# Patient Record
Sex: Female | Born: 1960
Health system: Southern US, Community
[De-identification: ages and names within clinical notes are randomized; demographics above are authoritative.]

## PROBLEM LIST (undated history)

## (undated) DIAGNOSIS — Z8742 Personal history of other diseases of the female genital tract: Secondary | ICD-10-CM

## (undated) DIAGNOSIS — M549 Dorsalgia, unspecified: Secondary | ICD-10-CM

## (undated) DIAGNOSIS — R3129 Other microscopic hematuria: Secondary | ICD-10-CM

## (undated) DIAGNOSIS — D649 Anemia, unspecified: Secondary | ICD-10-CM

## (undated) DIAGNOSIS — D219 Benign neoplasm of connective and other soft tissue, unspecified: Secondary | ICD-10-CM

## (undated) DIAGNOSIS — R0602 Shortness of breath: Secondary | ICD-10-CM

## (undated) DIAGNOSIS — I10 Essential (primary) hypertension: Secondary | ICD-10-CM

## (undated) DIAGNOSIS — Z8639 Personal history of other endocrine, nutritional and metabolic disease: Secondary | ICD-10-CM

## (undated) DIAGNOSIS — Z87448 Personal history of other diseases of urinary system: Secondary | ICD-10-CM

## (undated) DIAGNOSIS — N63 Unspecified lump in unspecified breast: Secondary | ICD-10-CM

## (undated) DIAGNOSIS — B977 Papillomavirus as the cause of diseases classified elsewhere: Secondary | ICD-10-CM

## (undated) DIAGNOSIS — R0789 Other chest pain: Secondary | ICD-10-CM

## (undated) DIAGNOSIS — B373 Candidiasis of vulva and vagina: Secondary | ICD-10-CM

## (undated) HISTORY — DX: Essential (primary) hypertension: I10

## (undated) HISTORY — PX: ABDOMINAL HYSTERECTOMY: SHX81

## (undated) HISTORY — DX: Unspecified lump in unspecified breast: N63.0

## (undated) HISTORY — DX: Personal history of other diseases of the female genital tract: Z87.42

## (undated) HISTORY — DX: Personal history of other diseases of urinary system: Z87.448

## (undated) HISTORY — DX: Anemia, unspecified: D64.9

## (undated) HISTORY — DX: Other chest pain: R07.89

## (undated) HISTORY — DX: Personal history of other endocrine, nutritional and metabolic disease: Z86.39

## (undated) HISTORY — DX: Benign neoplasm of connective and other soft tissue, unspecified: D21.9

## (undated) HISTORY — DX: Shortness of breath: R06.02

## (undated) HISTORY — DX: Papillomavirus as the cause of diseases classified elsewhere: B97.7

## (undated) HISTORY — DX: Other microscopic hematuria: R31.29

## (undated) HISTORY — DX: Candidiasis of vulva and vagina: B37.3

## (undated) HISTORY — DX: Dorsalgia, unspecified: M54.9

---

## 1982-09-09 HISTORY — PX: TUBAL LIGATION: SHX77

## 2000-11-14 ENCOUNTER — Other Ambulatory Visit: Admission: RE | Admit: 2000-11-14 | Discharge: 2000-11-14 | Payer: Self-pay | Admitting: Family Medicine

## 2001-05-14 ENCOUNTER — Encounter: Payer: Self-pay | Admitting: Family Medicine

## 2001-05-14 ENCOUNTER — Ambulatory Visit (HOSPITAL_COMMUNITY): Admission: RE | Admit: 2001-05-14 | Discharge: 2001-05-14 | Payer: Self-pay | Admitting: Family Medicine

## 2001-09-09 HISTORY — PX: HYSTEROSCOPY WITH D & C: SHX1775

## 2002-01-12 ENCOUNTER — Ambulatory Visit (HOSPITAL_COMMUNITY): Admission: RE | Admit: 2002-01-12 | Discharge: 2002-01-12 | Payer: Self-pay | Admitting: Obstetrics and Gynecology

## 2002-01-12 ENCOUNTER — Encounter: Payer: Self-pay | Admitting: Obstetrics and Gynecology

## 2002-05-28 ENCOUNTER — Encounter (INDEPENDENT_AMBULATORY_CARE_PROVIDER_SITE_OTHER): Payer: Self-pay | Admitting: Specialist

## 2002-05-28 ENCOUNTER — Ambulatory Visit (HOSPITAL_COMMUNITY): Admission: RE | Admit: 2002-05-28 | Discharge: 2002-05-28 | Payer: Self-pay | Admitting: Obstetrics and Gynecology

## 2002-11-09 ENCOUNTER — Other Ambulatory Visit: Admission: RE | Admit: 2002-11-09 | Discharge: 2002-11-09 | Payer: Self-pay | Admitting: Obstetrics and Gynecology

## 2003-07-19 ENCOUNTER — Encounter: Admission: RE | Admit: 2003-07-19 | Discharge: 2003-10-17 | Payer: Self-pay | Admitting: Family Medicine

## 2003-12-23 ENCOUNTER — Other Ambulatory Visit: Admission: RE | Admit: 2003-12-23 | Discharge: 2003-12-23 | Payer: Self-pay | Admitting: Obstetrics and Gynecology

## 2004-12-25 ENCOUNTER — Other Ambulatory Visit: Admission: RE | Admit: 2004-12-25 | Discharge: 2004-12-25 | Payer: Self-pay | Admitting: Obstetrics and Gynecology

## 2005-01-15 ENCOUNTER — Ambulatory Visit (HOSPITAL_COMMUNITY): Admission: RE | Admit: 2005-01-15 | Discharge: 2005-01-15 | Payer: Self-pay | Admitting: Obstetrics and Gynecology

## 2005-11-29 ENCOUNTER — Other Ambulatory Visit: Admission: RE | Admit: 2005-11-29 | Discharge: 2005-11-29 | Payer: Self-pay | Admitting: Obstetrics and Gynecology

## 2006-01-07 ENCOUNTER — Encounter: Admission: RE | Admit: 2006-01-07 | Discharge: 2006-02-26 | Payer: Self-pay | Admitting: Family Medicine

## 2006-04-01 ENCOUNTER — Ambulatory Visit (HOSPITAL_COMMUNITY): Admission: RE | Admit: 2006-04-01 | Discharge: 2006-04-02 | Payer: Self-pay | Admitting: Obstetrics and Gynecology

## 2006-04-01 ENCOUNTER — Encounter (INDEPENDENT_AMBULATORY_CARE_PROVIDER_SITE_OTHER): Payer: Self-pay | Admitting: *Deleted

## 2006-04-30 ENCOUNTER — Encounter: Admission: RE | Admit: 2006-04-30 | Discharge: 2006-04-30 | Payer: Self-pay | Admitting: Family Medicine

## 2006-06-28 ENCOUNTER — Encounter: Admission: RE | Admit: 2006-06-28 | Discharge: 2006-06-28 | Payer: Self-pay | Admitting: Orthopedic Surgery

## 2006-09-09 DIAGNOSIS — B373 Candidiasis of vulva and vagina: Secondary | ICD-10-CM

## 2006-09-09 DIAGNOSIS — B3731 Acute candidiasis of vulva and vagina: Secondary | ICD-10-CM

## 2006-09-09 HISTORY — DX: Acute candidiasis of vulva and vagina: B37.31

## 2006-09-09 HISTORY — DX: Candidiasis of vulva and vagina: B37.3

## 2006-09-09 HISTORY — PX: POLYPECTOMY: SHX149

## 2007-02-13 ENCOUNTER — Encounter: Admission: RE | Admit: 2007-02-13 | Discharge: 2007-03-10 | Payer: Self-pay | Admitting: Family Medicine

## 2007-06-10 ENCOUNTER — Encounter: Admission: RE | Admit: 2007-06-10 | Discharge: 2007-06-10 | Payer: Self-pay | Admitting: Family Medicine

## 2007-09-10 DIAGNOSIS — N63 Unspecified lump in unspecified breast: Secondary | ICD-10-CM

## 2007-09-10 HISTORY — DX: Unspecified lump in unspecified breast: N63.0

## 2010-04-06 ENCOUNTER — Encounter: Admission: RE | Admit: 2010-04-06 | Discharge: 2010-04-06 | Payer: Self-pay | Admitting: Family Medicine

## 2010-09-09 DIAGNOSIS — R3129 Other microscopic hematuria: Secondary | ICD-10-CM

## 2010-09-09 HISTORY — DX: Other microscopic hematuria: R31.29

## 2011-01-25 NOTE — H&P (Signed)
NAME:  Marzo, Traniece                 ACCOUNT NO.:  [id number]   MEDICAL RECORD NO.:  [id number]          PATIENT TYPE:  AMB   LOCATION:  SDC                           FACILITY:  [facility]   PHYSICIAN:  Naima A. Dillard, M.D. DATE OF BIRTH:  [birth year]   DATE OF ADMISSION:  DATE OF DISCHARGE:                                HISTORY & PHYSICAL   ADDENDUM TO HISTORY AND PHYSICAL:  Henreitta Leber, PA, is dictating this  History and Physical examination on Janet Humphreys for Naima A. Dillard, MD.   At the end of the dictation, I should have said it is for Naima A. Normand Sloop,  MD.      Marquis Lunch. Adline Peals.      Naima A. Normand Sloop, M.D.  Electronically Signed    EJP/MEDQ  D:  03/26/2006  T:  03/26/2006  Job:  [id number]

## 2011-01-25 NOTE — Discharge Summary (Signed)
NAME:  Erin [name], Erin [name]                 ACCOUNT NO.:  [id number]   MEDICAL RECORD NO.:  [id number]          PATIENT TYPE:  OIB   LOCATION:  9305                          FACILITY:  [facility]   PHYSICIAN:  Naima A. Dillard, M.D. DATE OF BIRTH:  [birth year]   DATE OF ADMISSION:  04/01/2006  DATE OF DISCHARGE:  04/02/2006                                 DISCHARGE SUMMARY   DISCHARGE DIAGNOSES:  1. Fibroid uterus.  2. Menorrhagia.  3. Dysmenorrhea.  4. Adenomyosis.   OPERATION:  On the date of admission, the patient underwent a  laparoscopically-assisted vaginal hysterectomy along with biopsy of a  posterior cul-de-sac mass, tolerating procedure well.  The patient was found  to have a uterus weighing approximately 493.8 g.   HISTORY OF PRESENT ILLNESS:  Erin [name] is a 50 year old single African  American female status post bilateral tubal ligation, para 2-0-0-2 who  presents for a laparoscopically-assisted vaginal hysterectomy because of  symptomatic fibroids.  Please see the patient's dictated history and  physical examination for details.   PREOPERATIVE PHYSICAL EXAMINATION:  VITAL SIGNS:  Blood pressure 120/70,  weight is 330, height is 5 feet 9-1/2 inches tall  GENERAL:  Within normal limits.  PELVIC:  EGBUS was within normal limits.  Vagina was normal.  Cervix was  nontender without lesions.  Uterus appeared normal size, shape and  consistency without tenderness.  Adnexa was without tenderness or masses.  Rectovaginal without any masses.   HOSPITAL COURSE:  On the date of admission, the patient underwent  aforementioned procedures, tolerating them all well.  The patient's  postoperative course was marked by fluctuations in her fasting glucose  levels between 104 and 121.  The patient was asymptomatic with these  findings and tolerated her postop hemoglobin of 7.5 (preop hemoglobin 9.5)  without symptomatology.  By postop day #1, the patient had resumed bowel and  bladder function and  was therefore deemed ready for discharge home.   DISCHARGE MEDICATIONS:  1. Tylox one or two tablets every 4 hours as needed for pain.  2. Colace 100 mg two to three times daily until bowel movements are      regular.  3. Ibuprofen 600 mg every 6 hours with food for 3 days, then as needed for      pain.  4. Phenergan 25 mg every 6 hours as needed for nausea.   The patient was also advised to follow instructions on her home medication  reconciliation sheet.   FOLLOW UP:  The patient has a 6-week postoperative visit with Dr. Normand Sloop on  May 13, 2006, at 10:30 a.m.  She was also advised to schedule an  appointment with Dr. Shaune Pollack for further evaluation and management of  her blood sugars.   DISCHARGE INSTRUCTIONS:  The patient was given a copy of [facility] postoperative instruction sheet.  She was further advised to check  her blood sugars fasting and every 2 hours after eating and report these  findings to Dr. Kevan Ny.  The patient was told to avoid driving for 2 weeks,  heavy lifting for  4 weeks (over 10 pounds), sexual activity for 6 weeks,  that she may shower, walk up steps, increase activities slowly.  Her diet to  be a low-sodium and modified diabetic diet.   FINAL PATHOLOGY:  Cul-de-sac biopsy - fibrocalcific nodule.  Uterus and  cervix:  Cervix - chronic cervicitis with squamous metaplasia, no  intraepithelial lesion identified; endometrium - proliferative endometrium,  no hyperplasia or malignancy identified; myometrium - adenomyosis and  leiomyoma.      [place] J. Adline Peals.      Naima A. Normand Sloop, M.D.  Electronically Signed    EJP/MEDQ  D:  04/30/2006  T:  04/30/2006  Job:  [id number]

## 2011-01-25 NOTE — Op Note (Signed)
NAME:  Erin [name], Erin [name]                 ACCOUNT NO.:  [id number]   MEDICAL RECORD NO.:  [id number]          PATIENT TYPE:  AMB   LOCATION:  SDC                           FACILITY:  [facility]   PHYSICIAN:  Naima A. Dillard, M.D. DATE OF BIRTH:  02-Sep-1961   DATE OF PROCEDURE:  04/01/2006  DATE OF DISCHARGE:                                 OPERATIVE REPORT   PREOPERATIVE DIAGNOSIS:  Fibroids, menorrhagia, and dysmenorrhea.   POSTOPERATIVE DIAGNOSIS:  Fibroids, menorrhagia, and dysmenorrhea.   PROCEDURE:  Laparoscopic-assisted vaginal hysterectomy.   SURGEON:  Naima A. Normand Sloop, M.D.   ASSISTANTMarquis Lunch. Adline Peals.   ANESTHESIA:  General.   SPECIMENS:  Went to pathology, uterus and cervix, 493.8 gm.  Posterior cul  de sac mass to pathology.   ESTIMATED BLOOD LOSS:  500 cc.   URINE OUTPUT:  200 cc.   IV FLUIDS:  3200 cc.   COMPLICATIONS:  None.   Patient went to PACU in stable condition.   PROCEDURE IN DETAIL:  Patient was taken to the operating room, where she was  given general anesthesia, placed in the dorsal lithotomy position, and  prepped and draped in a normal sterile fashion.  A bivalve speculum was  placed into the vagina.  The anterior lip of the cervix was grasped with a  single-tooth tenaculum.  A Hulka manipulator was placed into the uterus.  The single-tooth tenaculum was then removed, and all instruments except for  the Hulka were removed from the vagina.  A Foley catheter was placed.  Attention was then turned to the abdomen, where a 10 mm infraumbilical  incision was made with a scalpel and carried down to the fascia.  The fascia  was incised and extended bilaterally.  The peritoneum was identified, tented  up, and entered sharply.  The fascia was ligated in a purse-string fashion  with an 0 Vicryl.  The Hasson trocar was placed and anchored down with the 0  Vicryl.  Marcaine 0.25% 5 cc was placed before the incision was made.  Intra-  abdominal placement was  confirmed with the laparoscope.  The abdomen was  insufflated with CO2 gas.  The patient had a large, about 12 weeks size  uterus.  Normal-appearing tubes and ovaries, but the tubes had been ligated.  Patient had a normal-appearing liver and gallbladder, normal-appearing  appendix and abdominal anatomy.  Attention was then turned to the right and  left lower quadrant, where 2.5 cc of 0.25% Marcaine was placed, two 5 mm  incisions were placed, and 5 mm trocars were placed under direct  visualization of the laparoscope.  The patient's round ligaments were  cauterized with a gyrus and then cut.  A bladder flap was made with  hydrodissection using a Nezhat and cut away using Metzenbaum scissors.  The  patient's utero-ovarian ligaments were cauterized and then cut.  Hemostasis  was assured.  Attention was then turned to the vagina, where a  circumferential incision was made after 40 cc of Pitressin mixture was  placed circumferentially around the cervix.  The rectum and bladder was  dissected off the cervix.  __________, the posterior cul de sac was then  entered sharply using scissors.  There was a small, little sebaceous gland  cyst or something, and the cul de sac was sent for pathology.  The anterior  cul de sac was also entered sharply using Metzenbaum scissors.  The bladder  was noted to be safe.  The uterosacral ligaments were clamped with Heaney  clamps, cut, and suture ligated.  The uterine arteries were cut with Heaney  clamps, cut and suture ligated.  The uterus was then removed without  difficulty.  There was some bleeding along the patient's right side wall,  which was made hemostatic with figure-of-eight stitch and a vessel was tied  down without difficulty.  A McCall suture was then placed with 0 Vicryl.  The vaginal cuff was closed with 0 chromic in a running locked fashion.  The  incision was then turned back to the abdomen.  There was a small amount of  bleeding just right at  the vaginal cuff which was made hemostatic with  cautery.  Gelfoam was placed in that area.  Irrigation was done.  All areas  were noted to be hemostatic.  All instruments were removed under direct  visualization of the laparoscope in all areas.  Ports were seen to be  hemostatic.  The fascia was reapproximated by tying the purse-string suture.  The skin at the umbilical site was reapproximated using 3-0 Monocryl.  The  two small 5 mm skin incisions were reapproximated using Dermabond.  Sponge,  lap, and needle counts were correct.  Patient went to the recovery room in a  stable condition.      Naima A. Normand Sloop, M.D.  Electronically Signed     NAD/MEDQ  D:  04/01/2006  T:  04/01/2006  Job:  [id number]

## 2012-03-16 ENCOUNTER — Ambulatory Visit: Payer: Self-pay | Admitting: Obstetrics and Gynecology

## 2012-04-03 ENCOUNTER — Encounter: Payer: Self-pay | Admitting: Obstetrics and Gynecology

## 2012-04-03 ENCOUNTER — Ambulatory Visit (INDEPENDENT_AMBULATORY_CARE_PROVIDER_SITE_OTHER): Payer: 59 | Admitting: Obstetrics and Gynecology

## 2012-04-03 VITALS — BP 124/80 | Ht 69.0 in | Wt 348.0 lb

## 2012-04-03 DIAGNOSIS — Z0000 Encounter for general adult medical examination without abnormal findings: Secondary | ICD-10-CM

## 2012-04-03 NOTE — Addendum Note (Signed)
Addended by: Rolla Plate on: 04/03/2012 10:34 AM   Modules accepted: Orders

## 2012-04-03 NOTE — Progress Notes (Signed)
Last Pap: 1/11 WNL: Yes Regular Periods:no Contraception: hyst  Monthly Breast exam:yes Tetanus<32yrs:yes Nl.Bladder Function:yes Daily BMs:yes Healthy Diet:no Calcium:yes Mammogram:yes Date of Mammogram: 4/13 wnl per pt Exercise:no Have often Exercise: n/a Seatbelt: yes Abuse at home: no Stressful work:no Sigmoid-colonoscopy: 5 yrs ago wnl per pt BP 124/80  Ht 5\' 9"  (1.753 m)  Wt 348 lb (157.852 kg)  BMI 51.39 kg/m2 Pt without complaints Physical Examination: General appearance - alert, well appearing, and in no distress Mental status - normal mood, behavior, speech, dress, motor activity, and thought processes Neck - supple, no significant adenopathy, thyroid exam: thyroid is normal in size without nodules or tenderness Chest - clear to auscultation, no wheezes, rales or rhonchi, symmetric air entry Heart - normal rate and regular rhythm Abdomen - soft, nontender, nondistended, no masses or organomegaly Breasts - breasts appear normal, no suspicious masses, no skin or nipple changes or axillary nodes Pelvic - normal external genitalia, vulva, vagina, cervix, uterus and adnexa Rectal - normal rectal, no masses Back exam - full range of motion, no tenderness, palpable spasm or pain on motion Neurological - alert, oriented, normal speech, no focal findings or movement disorder noted Musculoskeletal - no joint tenderness, deformity or swelling Extremities - no edema, redness or tenderness in the calves or thighs Skin - normal coloration and turgor, no rashes, no suspicious skin lesions noted Routine exam Pap sent yes if normal last one needed Mammogram due no  RT 1 yr  Bone Density: No PCP: Dr.GAtes Change in PMH: no change Change in FMH:no change

## 2012-04-06 LAB — PAP IG W/ RFLX HPV ASCU

## 2012-04-23 ENCOUNTER — Ambulatory Visit
Admission: RE | Admit: 2012-04-23 | Discharge: 2012-04-23 | Disposition: A | Discharge status: Home / Self Care | Payer: Self-pay | Source: Ambulatory Visit | Attending: Family Medicine | Admitting: Family Medicine

## 2012-04-23 ENCOUNTER — Other Ambulatory Visit: Payer: Self-pay | Admitting: Family Medicine

## 2012-04-23 DIAGNOSIS — R52 Pain, unspecified: Secondary | ICD-10-CM

## 2012-07-21 ENCOUNTER — Telehealth: Payer: Self-pay | Admitting: Obstetrics and Gynecology

## 2012-07-21 NOTE — Telephone Encounter (Signed)
Lm on vm to cb per telephone call.  

## 2012-07-21 NOTE — Telephone Encounter (Signed)
Tc to pt. Pt needs eval to r/o yeast infection. Appt sched 07/22/12@11 :30 with EP for eval. Pt agrees.

## 2012-07-21 NOTE — Telephone Encounter (Signed)
Tc to pt per telephone call. Pt recently completed ATB's pres by dentist. Pt req rx for a yeast infection. Pt with h/o yeast infection after taking ATB's. Will consult with ND per recs. Pt agrees.

## 2012-07-22 ENCOUNTER — Ambulatory Visit (INDEPENDENT_AMBULATORY_CARE_PROVIDER_SITE_OTHER): Payer: 59 | Admitting: Obstetrics and Gynecology

## 2012-07-22 ENCOUNTER — Telehealth: Payer: Self-pay | Admitting: Obstetrics and Gynecology

## 2012-07-22 ENCOUNTER — Encounter: Payer: Self-pay | Admitting: Obstetrics and Gynecology

## 2012-07-22 VITALS — BP 122/80 | Wt 310.0 lb

## 2012-07-22 DIAGNOSIS — I10 Essential (primary) hypertension: Secondary | ICD-10-CM

## 2012-07-22 DIAGNOSIS — E119 Type 2 diabetes mellitus without complications: Secondary | ICD-10-CM

## 2012-07-22 DIAGNOSIS — N760 Acute vaginitis: Secondary | ICD-10-CM

## 2012-07-22 DIAGNOSIS — E1159 Type 2 diabetes mellitus with other circulatory complications: Secondary | ICD-10-CM | POA: Insufficient documentation

## 2012-07-22 DIAGNOSIS — I152 Hypertension secondary to endocrine disorders: Secondary | ICD-10-CM | POA: Insufficient documentation

## 2012-07-22 DIAGNOSIS — A5901 Trichomonal vulvovaginitis: Secondary | ICD-10-CM

## 2012-07-22 LAB — POCT WET PREP (WET MOUNT)
Whiff Test: NEGATIVE
pH: 5.5

## 2012-07-22 MED ORDER — FLUCONAZOLE 150 MG PO TABS: 150.0000 mg | ORAL_TABLET | Freq: Once | ORAL | Status: DC

## 2012-07-22 MED ORDER — METRONIDAZOLE 500 MG PO TABS: ORAL_TABLET | ORAL | Status: DC

## 2012-07-22 MED ORDER — TERCONAZOLE 0.4 % VA CREA: 1.0000 | TOPICAL_CREAM | Freq: Every day | VAGINAL | Status: DC

## 2012-07-22 NOTE — Addendum Note (Signed)
Addended by: Henreitta Leber on: 07/22/2012 01:49 PM   Modules accepted: Orders

## 2012-07-22 NOTE — Progress Notes (Signed)
51 YO with recent 7 day course of antibiotics complains of vaginal irritatiion.  O: Pelvic:  EGBUS-wnl, vagina-copious thin grey discharge, uterus/cervix-surgically absent, adnexae-no masses  Wet Prep: pH-5.5,  whiff-negative,  many trichomonads (motile)  A: Trichomoniasis  P:  Reviewed trichomoniasis and need for partner treatment      Metronidazole 500 mg  #4  4 po stat no refills      Terazol 7 Vaginal #1 tube 1 app. pv qhs x 7 days no refill       Diflucan 150 mg #1  1 po stat  1 refill       RTO-as scheduled or prn  Noeli Lavery, PA-C

## 2012-07-22 NOTE — Patient Instructions (Signed)
Trichomoniasis Trichomoniasis is an infection, caused by the Trichomonas organism, that affects both women and men. In women, the outer female genitalia and the vagina are affected. In men, the penis is mainly affected, but the prostate and other reproductive organs can also be involved. Trichomoniasis is a sexually transmitted disease (STD) and is most often passed to another person through sexual contact. The majority of people who get trichomoniasis do so from a sexual encounter and are also at risk for other STDs. CAUSES   Sexual intercourse with an infected partner.  It can be present in swimming pools or hot tubs. SYMPTOMS   Abnormal gray-green frothy vaginal discharge in women.  Vaginal itching and irritation in women.  Itching and irritation of the area outside the vagina in women.  Penile discharge with or without pain in males.  Inflammation of the urethra (urethritis), causing painful urination.  Bleeding after sexual intercourse. RELATED COMPLICATIONS  Pelvic inflammatory disease.  Infection of the uterus (endometritis).  Infertility.  Tubal (ectopic) pregnancy.  It can be associated with other STDs, including gonorrhea and chlamydia, hepatitis B, and HIV. COMPLICATIONS DURING PREGNANCY  Early (premature) delivery.  Premature rupture of the membranes (PROM).  Low birth weight. DIAGNOSIS   Visualization of Trichomonas under the microscope from the vagina discharge.  Ph of the vagina greater than 4.5, tested with a test tape.  Trich Rapid Test.  Culture of the organism, but this is not usually needed.  It may be found on a Pap test.  Having a "strawberry cervix,"which means the cervix looks very red like a strawberry. TREATMENT   You may be given medication to fight the infection. Inform your caregiver if you could be or are pregnant. Some medications used to treat the infection should not be taken during pregnancy.  Over-the-counter medications or  creams to decrease itching or irritation may be recommended.  Your sexual partner will need to be treated if infected. HOME CARE INSTRUCTIONS   Take all medication prescribed by your caregiver.  Take over-the-counter medication for itching or irritation as directed by your caregiver.  Do not have sexual intercourse while you have the infection.  Do not douche or wear tampons.  Discuss your infection with your partner, as your partner may have acquired the infection from you. Or, your partner may have been the person who transmitted the infection to you.  Have your sex partner examined and treated if necessary.  Practice safe, informed, and protected sex.  See your caregiver for other STD testing. SEEK MEDICAL CARE IF:   You still have symptoms after you finish the medication.  You have an oral temperature above 102 F (38.9 C).  You develop belly (abdominal) pain.  You have pain when you urinate.  You have bleeding after sexual intercourse.  You develop a rash.  The medication makes you sick or makes you throw up (vomit). Document Released: 02/19/2001 Document Revised: 11/18/2011 Document Reviewed: 03/17/2009 [employer] Patient Information 2013 [employer].  

## 2012-07-22 NOTE — Progress Notes (Signed)
Color: white Odor: no Itching:yes Thin:yes Thick:no Fever:no Dyspareunia:no Hx PID:no HX STD:no Pelvic Pain:no Desires Gc/CT:no Desires HIV,RPR,HbsAG:no 

## 2012-07-22 NOTE — Telephone Encounter (Signed)
Tc to pt per telephone call. Pt received 14 pills of Flagyl, but told to only take 4 pills. Informed pt only to take 4 pills STAT. Pt voices understanding.

## 2014-07-03 IMAGING — CR DG ANKLE COMPLETE 3+V*R*
2 series · 2 of 2 positions shown · non-contrast
Comparison: None.

CLINICAL DATA: Pain and swelling for several weeks, no acute injury

RIGHT ANKLE - COMPLETE 3+ VIEW

[view not recorded (1 of 2)]
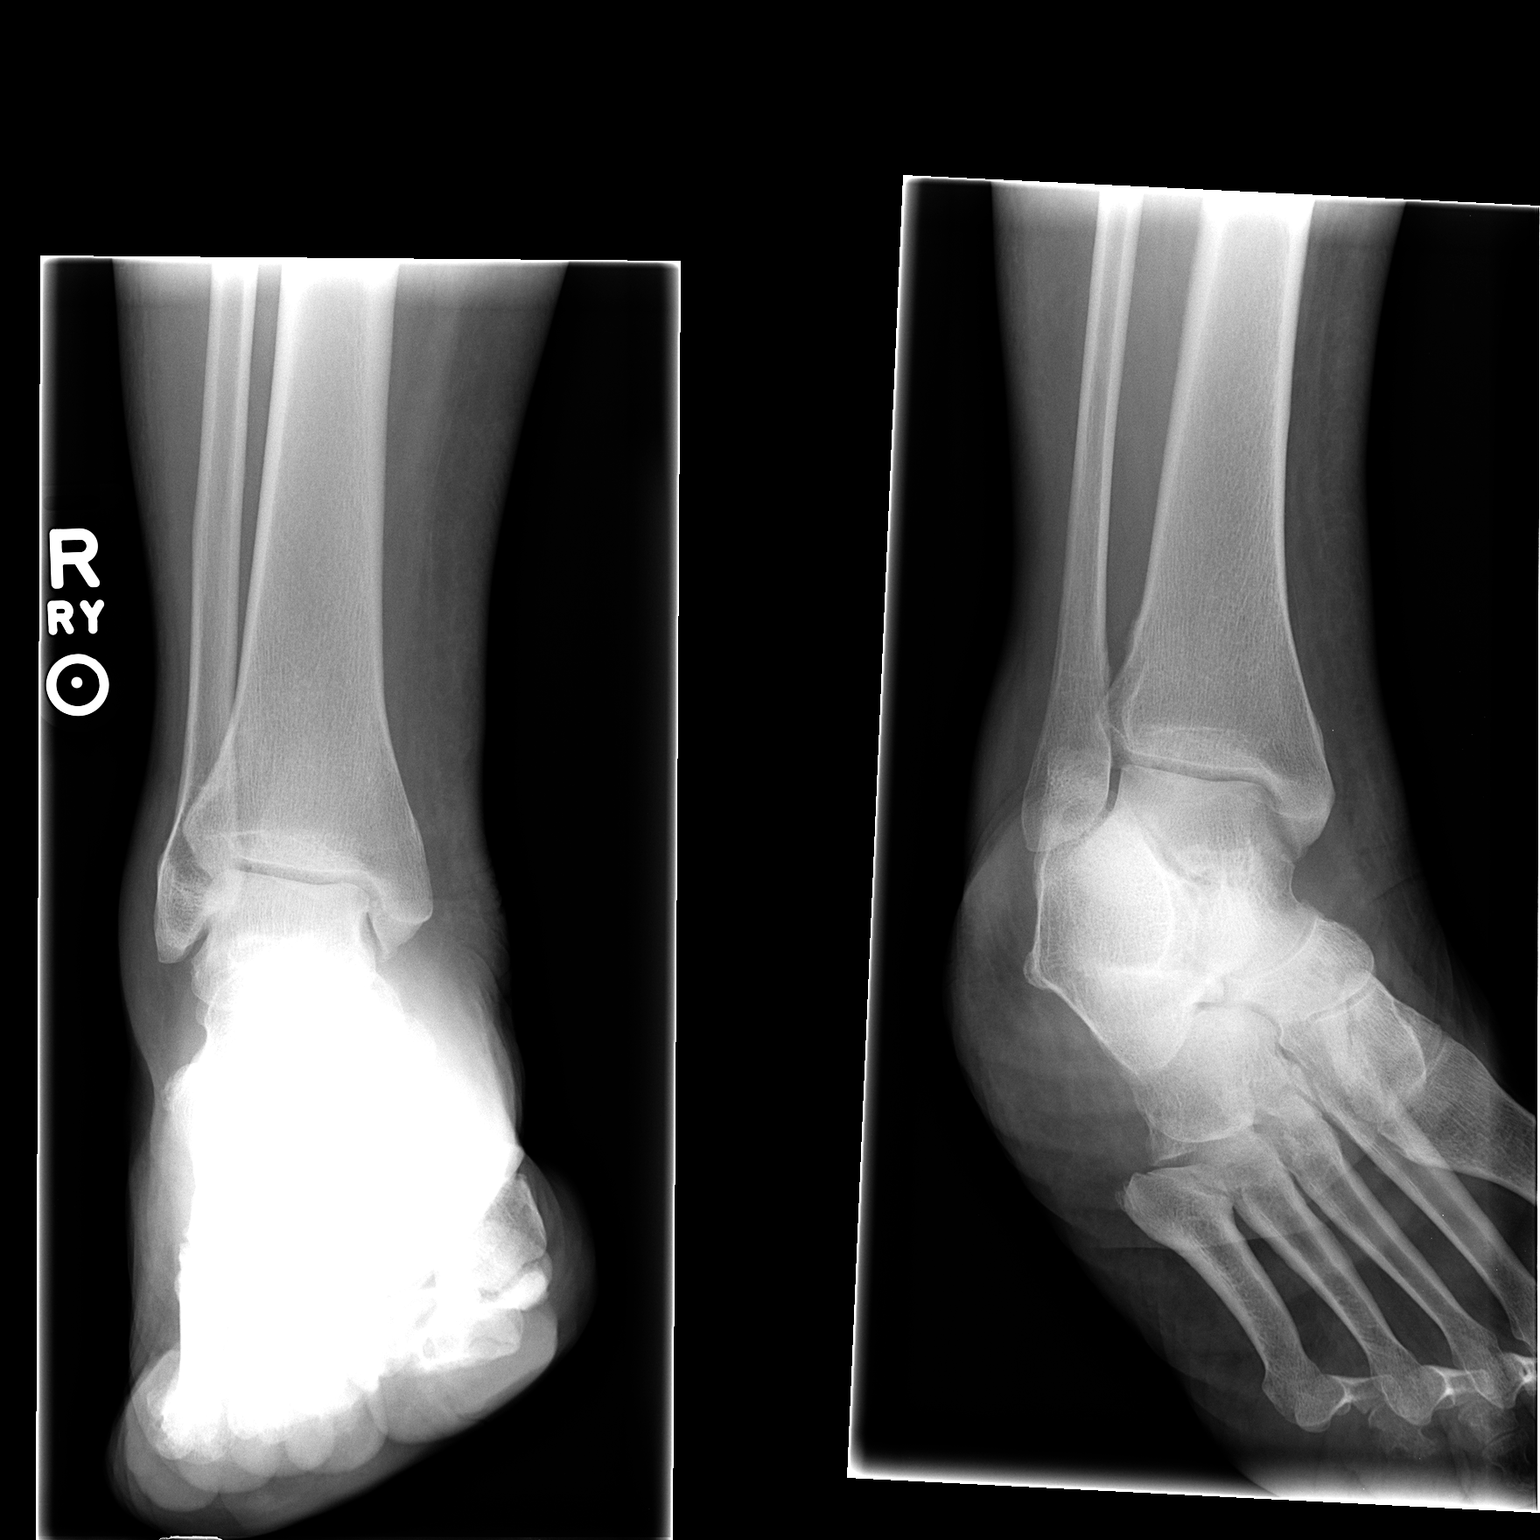

[view not recorded (2 of 2)]
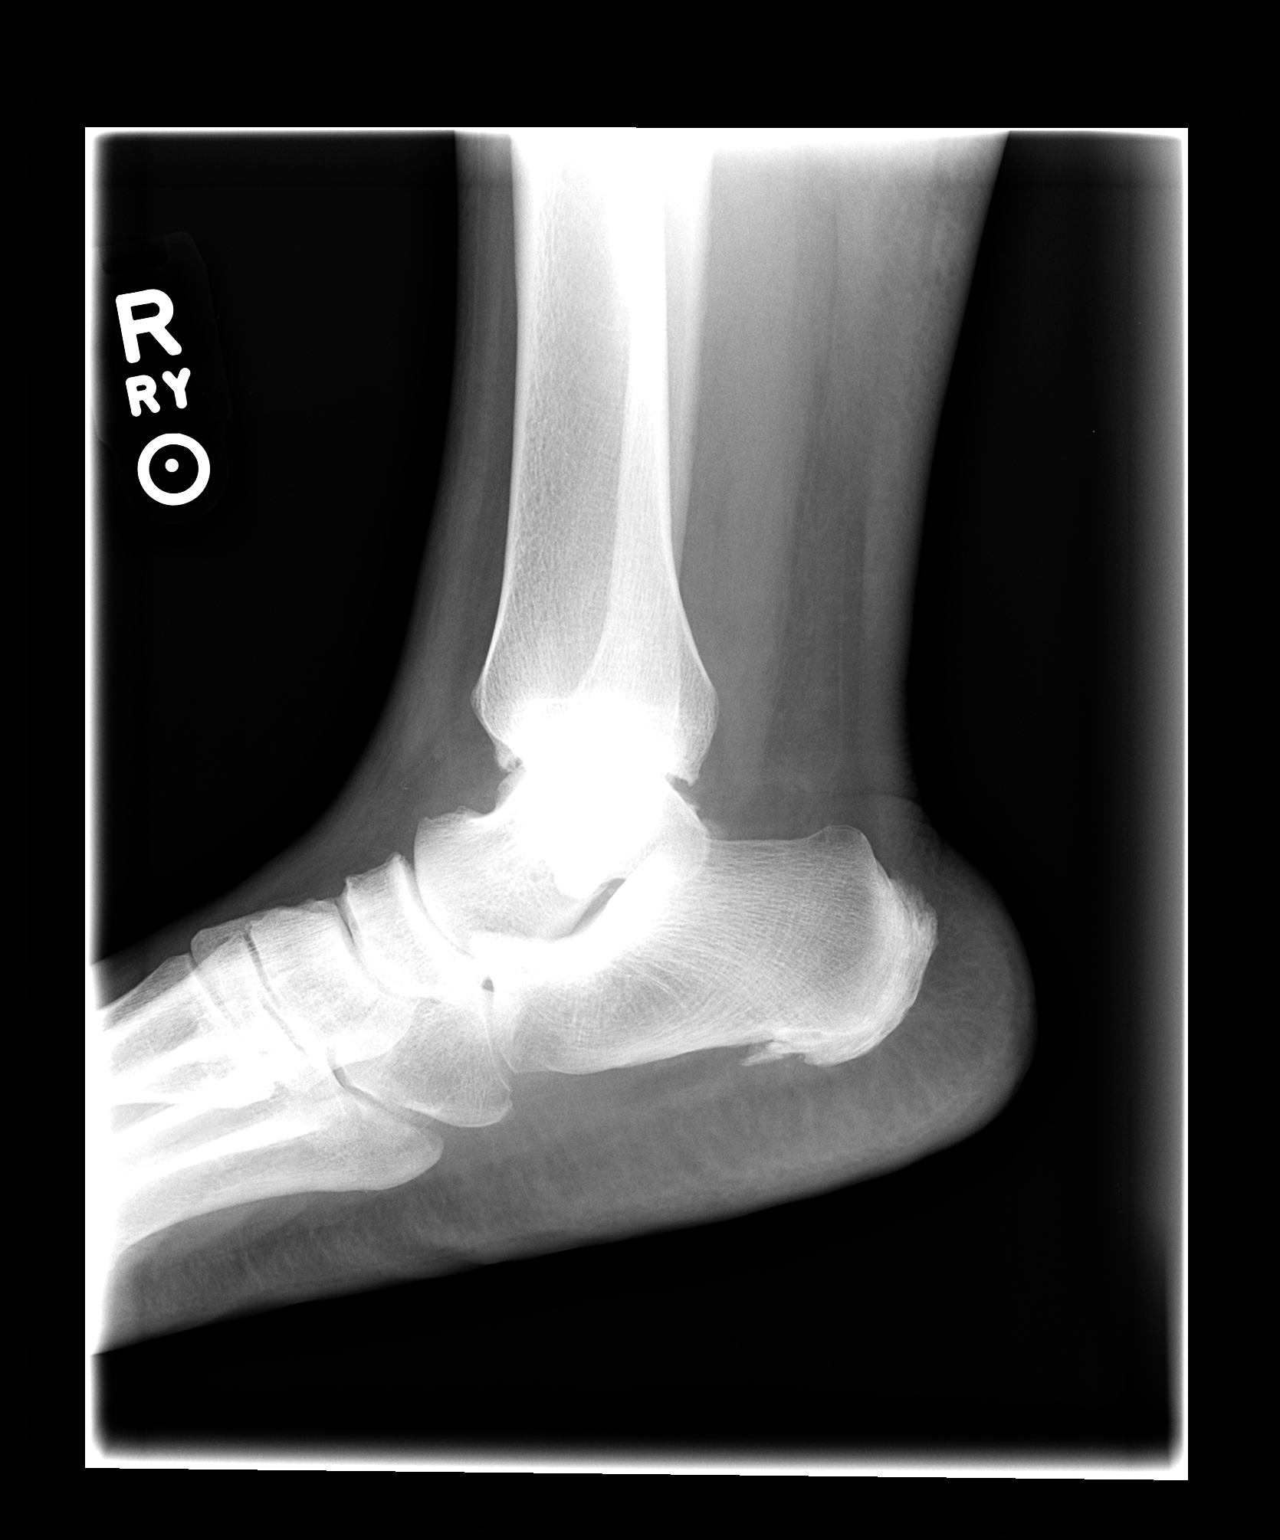

[2 of 2 positions shown; findings below may reference images not displayed]

FINDINGS: Alignment is normal.  No acute fracture is seen.  The
ankle joint appears normal.  A plantar calcaneal degenerative spur
is present.  There is pes planus present.
IMPRESSION: No acute fracture.  Plantar calcaneal degenerative spur.

## 2014-07-11 ENCOUNTER — Encounter: Payer: Self-pay | Admitting: Obstetrics and Gynecology

## 2015-02-09 ENCOUNTER — Telehealth: Payer: Self-pay | Admitting: Hematology

## 2015-02-09 NOTE — Telephone Encounter (Signed)
Left message regarding new pt appt.  Dx: Iron Deficiency/Anemia Referring: [facility]

## 2015-02-10 ENCOUNTER — Telehealth: Payer: Self-pay | Admitting: Hematology

## 2015-02-10 NOTE — Telephone Encounter (Signed)
Patient called back to schedule appt.   Feng 03/08/15 10:30 am

## 2015-03-08 ENCOUNTER — Telehealth: Payer: Self-pay | Admitting: Hematology

## 2015-03-08 ENCOUNTER — Ambulatory Visit (HOSPITAL_BASED_OUTPATIENT_CLINIC_OR_DEPARTMENT_OTHER): Payer: Commercial Managed Care - HMO | Admitting: Hematology

## 2015-03-08 ENCOUNTER — Encounter: Payer: Self-pay | Admitting: Hematology

## 2015-03-08 ENCOUNTER — Ambulatory Visit (HOSPITAL_BASED_OUTPATIENT_CLINIC_OR_DEPARTMENT_OTHER): Payer: Commercial Managed Care - HMO

## 2015-03-08 ENCOUNTER — Ambulatory Visit: Payer: Self-pay

## 2015-03-08 VITALS — BP 123/64 | HR 66 | Temp 98.3°F | Resp 18 | Ht 69.0 in | Wt 339.0 lb

## 2015-03-08 DIAGNOSIS — D638 Anemia in other chronic diseases classified elsewhere: Secondary | ICD-10-CM | POA: Insufficient documentation

## 2015-03-08 DIAGNOSIS — D649 Anemia, unspecified: Secondary | ICD-10-CM | POA: Diagnosis not present

## 2015-03-08 LAB — COMPREHENSIVE METABOLIC PANEL (CC13)
ALK PHOS: 93 U/L (ref 40–150)
ALT: 13 U/L (ref 0–55)
AST: 12 U/L (ref 5–34)
Albumin: 3.7 g/dL (ref 3.5–5.0)
Anion Gap: 9 mEq/L (ref 3–11)
BILIRUBIN TOTAL: 0.33 mg/dL (ref 0.20–1.20)
BUN: 8.4 mg/dL (ref 7.0–26.0)
CHLORIDE: 104 meq/L (ref 98–109)
CO2: 27 mEq/L (ref 22–29)
Calcium: 9.6 mg/dL (ref 8.4–10.4)
Creatinine: 0.7 mg/dL (ref 0.6–1.1)
GLUCOSE: 126 mg/dL (ref 70–140)
POTASSIUM: 3.9 meq/L (ref 3.5–5.1)
SODIUM: 140 meq/L (ref 136–145)
Total Protein: 7.2 g/dL (ref 6.4–8.3)

## 2015-03-08 LAB — CBC & DIFF AND RETIC
BASO%: 0.1 % (ref 0.0–2.0)
Basophils Absolute: 0 10*3/uL (ref 0.0–0.1)
EOS ABS: 0.2 10*3/uL (ref 0.0–0.5)
EOS%: 2.1 % (ref 0.0–7.0)
HCT: 36.8 % (ref 34.8–46.6)
HGB: 11.6 g/dL (ref 11.6–15.9)
Immature Retic Fract: 10.7 % — ABNORMAL HIGH (ref 1.60–10.00)
LYMPH#: 2 10*3/uL (ref 0.9–3.3)
LYMPH%: 28.6 % (ref 14.0–49.7)
MCH: 25.6 pg (ref 25.1–34.0)
MCHC: 31.5 g/dL (ref 31.5–36.0)
MCV: 81.1 fL (ref 79.5–101.0)
MONO#: 0.4 10*3/uL (ref 0.1–0.9)
MONO%: 5.2 % (ref 0.0–14.0)
NEUT%: 64 % (ref 38.4–76.8)
NEUTROS ABS: 4.5 10*3/uL (ref 1.5–6.5)
Platelets: 347 10*3/uL (ref 145–400)
RBC: 4.54 10*6/uL (ref 3.70–5.45)
RDW: 14.8 % — AB (ref 11.2–14.5)
RETIC %: 1.41 % (ref 0.70–2.10)
Retic Ct Abs: 64.01 10*3/uL (ref 33.70–90.70)
WBC: 7.1 10*3/uL (ref 3.9–10.3)

## 2015-03-08 LAB — MORPHOLOGY
PLT EST: ADEQUATE
RBC COMMENTS: NORMAL

## 2015-03-08 LAB — IRON AND TIBC CHCC
%SAT: 13 % — AB (ref 21–57)
IRON: 32 ug/dL — AB (ref 41–142)
TIBC: 243 ug/dL (ref 236–444)
UIBC: 210 ug/dL (ref 120–384)

## 2015-03-08 LAB — FERRITIN CHCC: FERRITIN: 422 ng/mL — AB (ref 9–269)

## 2015-03-08 LAB — TSH CHCC: TSH: 1.96 m(IU)/L (ref 0.308–3.960)

## 2015-03-08 NOTE — Telephone Encounter (Signed)
Gave and printed appt sched and avs for pt for July and Aug °

## 2015-03-08 NOTE — Progress Notes (Signed)
I checked in new patient with no issues prior to seeing the dr °

## 2015-03-08 NOTE — Progress Notes (Signed)
[place]  [phone] Fax:(336) [place] consult Note   Patient Care Team: Darcus Austin, MD as PCP - General (Family Medicine) 03/08/2015  CHIEF COMPLAINTS/PURPOSE OF CONSULTATION:  Anemia   HISTORY OF PRESENTING ILLNESS:  Erin [name] 54 y.o. female is here because of chronic anemia   She has had anemia for more than 10 years, probably 15 years. She used to have heavy menstrual period, and she had hysrectomy 9 years ago, and did not improve her anemia. She has been taking iron pill (1-2 tab daily), which did not help with her anemia much. She used to donate blood, but stopped 15 years ago due to anemia. Her labwork in January 2016 showed hemoglobin 11.4, MCV 81.4, normal WBC and platelet count, serum iron 34, TIBC 281, transferrin saturation 12%. She increased her pill to 2 tablets a day since then, her repeated lab work in May 2016 showed hemoglobin 11.2, hematocrit 34.8, serum) 24, TIBC 279, transferrin saturation 9%.  She denies recent chest pain on exertion, shortness of breath on minimal exertion, pre-syncopal episodes, or palpitations. She had not noticed any recent bleeding such as epistaxis, hematuria or hematochezia She is on aspirin 81mg  daily. She takes ibuprofen 1-2 times a week for back pain ` Her last EGD, capsule endoscopy and colonoscopy was 8 years ago, which were negative except one colon polyp which was removed.  She had no prior history or diagnosis of cancer. Her age appropriate screening programs are up-to-date. She denies any pica and eats a variety of diet.  She has mild fatigue, expecially in the morning. She works for a [employer]. She has chronic back pain, no other compa\lains    MEDICAL HISTORY:  Past Medical History  Diagnosis Date  . Diabetes mellitus   . Hypertension   . H/O hypercholesterolemia   . Microhematuria 2012  . HPV in female   . Anemia   . H/O: menorrhagia   . H/O dysmenorrhea   .  Fibroid     Symptomatic  . H/O: obesity   . Yeast vaginitis 2008  . Breast nodule 2009    Right   . H/O hematuria     SURGICAL HISTORY: Past Surgical History  Procedure Laterality Date  . Abdominal hysterectomy    . Tubal ligation  1984    Bilateral  . Hysteroscopy w/d&c  2003  . Polypectomy  2008    SOCIAL HISTORY: History   Social History  . Marital Status: Divorced    Spouse Name: N/A  . Number of Children: N/A  . Years of Education: N/A   Occupational History  . Not on file.   Social History Main Topics  . Smoking status: Never Smoker   . Smokeless tobacco: Former Systems developer  . Alcohol Use: No  . Drug Use: No  . Sexual Activity: No     Comment: hysterectomy   Other Topics Concern  . Not on file   Social History Narrative    FAMILY HISTORY: Family History  Problem Relation Age of Onset  . Diabetes Mother   . Diabetes Brother     ALLERGIES:  has No Known Allergies.  MEDICATIONS:  Current Outpatient Prescriptions  Medication Sig Dispense Refill  . aspirin 81 MG tablet Take 81 mg by mouth daily.    . Cholecalciferol (VITAMIN D-3 PO) Take 1,000 Units by mouth daily.     [name] Fe Bisgly-Succ-C-Thre-B12-FA (IRON-150 PO) Take 1 tablet by mouth 2 (two) times daily.     [name]  glipiZIDE (GLUCOTROL XL) 10 MG 24 hr tablet Take 10 mg by mouth daily.  5  . ibuprofen (ADVIL,MOTRIN) 800 MG tablet TAKE 1 TABLET(S) BY MOUTH 2 TIMES A DAY AS NEEDED [PRN]  3  . losartan (COZAAR) 50 MG tablet Take 50 mg by mouth daily.  5  . metFORMIN (GLUCOPHAGE-XR) 500 MG 24 hr tablet Take 1,000 mg by mouth 2 (two) times daily.  5  . Multiple Vitamin (MULTIVITAMIN) capsule Take 1 capsule by mouth daily.    . pravastatin (PRAVACHOL) 40 MG tablet Take 40 mg by mouth daily.    . traMADol (ULTRAM) 50 MG tablet Take by mouth every 6 (six) hours as needed.    . vitamin C (ASCORBIC ACID) 500 MG tablet Take 500 mg by mouth daily.    . Vitamin D, Ergocalciferol, (DRISDOL) 50000 UNITS CAPS capsule Take  50,000 Units by mouth 2 (two) times a week.     No current facility-administered medications for this visit.    REVIEW OF SYSTEMS:   Constitutional: Denies fevers, chills or abnormal night sweats Eyes: Denies blurriness of vision, double vision or watery eyes Ears, nose, mouth, throat, and face: Denies mucositis or sore throat Respiratory: Denies cough, dyspnea or wheezes Cardiovascular: Denies palpitation, chest discomfort or lower extremity swelling Gastrointestinal:  Denies nausea, heartburn or change in bowel habits Skin: Denies abnormal skin rashes Lymphatics: Denies new lymphadenopathy or easy bruising Neurological:Denies numbness, tingling or new weaknesses Behavioral/Psych: Mood is stable, no new changes  All other systems were reviewed with the patient and are negative.  PHYSICAL EXAMINATION: ECOG PERFORMANCE STATUS: 1 - Symptomatic but completely ambulatory  Filed Vitals:   03/08/15 1148  BP: 123/64  Pulse: 66  Temp: 98.3 F (36.8 C)  Resp: 18   Filed Weights   03/08/15 1148  Weight: 339 lb (153.769 kg)    GENERAL:alert, no distress and comfortable SKIN: skin color, texture, turgor are normal, no rashes or significant lesions EYES: normal, conjunctiva are pink and non-injected, sclera clear OROPHARYNX:no exudate, no erythema and lips, buccal mucosa, and tongue normal  NECK: supple, thyroid normal size, non-tender, without nodularity LYMPH:  no palpable lymphadenopathy in the cervical, axillary or inguinal LUNGS: clear to auscultation and percussion with normal breathing effort HEART: regular rate & rhythm and no murmurs and no lower extremity edema ABDOMEN:abdomen soft, non-tender and normal bowel sounds Musculoskeletal:no cyanosis of digits and no clubbing  PSYCH: alert & oriented x 3 with fluent speech NEURO: no focal motor/sensory deficits  LABORATORY DATA:  I have reviewed the data as listed CBC Latest Ref Rng 03/08/2015  WBC 3.9 - 10.3 10e3/uL 7.1    Hemoglobin 11.6 - 15.9 g/dL 11.6  Hematocrit 34.8 - 46.6 % 36.8  Platelets 145 - 400 10e3/uL 347    CMP Latest Ref Rng 03/08/2015  Glucose 70 - 140 mg/dl 126  BUN 7.0 - 26.0 mg/dL 8.4  Creatinine 0.6 - 1.1 mg/dL 0.7  Sodium 136 - 145 mEq/L 140  Potassium 3.5 - 5.1 mEq/L 3.9  CO2 22 - 29 mEq/L 27  Calcium 8.4 - 10.4 mg/dL 9.6  Total Protein 6.4 - 8.3 g/dL 7.2  Total Bilirubin 0.20 - 1.20 mg/dL 0.33  Alkaline Phos 40 - 150 U/L 93  AST 5 - 34 U/L 12  ALT 0 - 55 U/L 13     RADIOGRAPHIC STUDIES: I have personally reviewed the radiological images as listed and agreed with the findings in the report. No results found.  ASSESSMENT & PLAN:  54 year old  female, presented with long-standing history of mild anemia  1. Normocytic hypo-productive anemia -A repeated CBC showed hemoglobin 11.6, normal MCV, absolute reticular count 64.  -Her outside lab showed low serum iron, normal TIBC but on the low and, low transferrin saturation, this is likely anemia of chronic disease, she may have component of iron deficient anemia also. -I'll check her ferritin level also today.  -To complete her anemia workup, I'll also check her folic acid, S23 level, transferrin receptor, SPEP with immunofixation, serum light chain level, ANA, and sedimentation rate  -She has failed oral iron supplement. I'll give him a trial of IV Feraheme 510 mg infusion twice, to see if her anemia improves. -She had a negative GI workup for her anemia. I do not feel she needs to repeat endoscopy  -I'll see her back in 6 Weeks with repeated CBC  2. Diabetes, HTN, obesity -She'll continue follow-up with her primary care physician  Plan -Lab today -IV Feraheme weekly x2 in the next 2-3 weeks -Return to clinic for follow-up in 6 weeks   All questions were answered. The patient knows to call the clinic with any problems, questions or concerns. I spent 40 minutes counseling the patient face to face. The total time spent in the  appointment was 50 minutes and more than 50% was on counseling.     Truitt Merle, MD 03/08/2015 7:38 PM

## 2015-03-10 LAB — SPEP & IFE WITH QIG
Albumin ELP: 3.9 g/dL (ref 3.8–4.8)
Alpha-1-Globulin: 0.4 g/dL — ABNORMAL HIGH (ref 0.2–0.3)
Alpha-2-Globulin: 0.8 g/dL (ref 0.5–0.9)
Beta 2: 0.5 g/dL (ref 0.2–0.5)
Beta Globulin: 0.4 g/dL (ref 0.4–0.6)
Gamma Globulin: 1.1 g/dL (ref 0.8–1.7)
IGA: 131 mg/dL (ref 69–380)
IGG (IMMUNOGLOBIN G), SERUM: 1360 mg/dL (ref 690–1700)
IGM, SERUM: 98 mg/dL (ref 52–322)
Total Protein, Serum Electrophoresis: 7.1 g/dL (ref 6.1–8.1)

## 2015-03-10 LAB — KAPPA/LAMBDA LIGHT CHAINS
KAPPA FREE LGHT CHN: 1.66 mg/dL (ref 0.33–1.94)
KAPPA LAMBDA RATIO: 1.19 (ref 0.26–1.65)
LAMBDA FREE LGHT CHN: 1.4 mg/dL (ref 0.57–2.63)

## 2015-03-10 LAB — ANA: ANA: NEGATIVE

## 2015-03-10 LAB — VITAMIN B12: VITAMIN B 12: 319 pg/mL (ref 211–911)

## 2015-03-10 LAB — SEDIMENTATION RATE: SED RATE: 34 mm/h — AB (ref 0–30)

## 2015-03-10 LAB — FOLATE RBC: RBC FOLATE: 1013 ng/mL (ref >280)

## 2015-03-22 ENCOUNTER — Ambulatory Visit: Payer: Commercial Managed Care - HMO

## 2015-03-23 ENCOUNTER — Ambulatory Visit (HOSPITAL_BASED_OUTPATIENT_CLINIC_OR_DEPARTMENT_OTHER): Payer: Commercial Managed Care - HMO

## 2015-03-23 ENCOUNTER — Other Ambulatory Visit: Payer: Self-pay | Admitting: Hematology

## 2015-03-23 VITALS — BP 120/75 | HR 72 | Temp 98.2°F | Resp 18

## 2015-03-23 DIAGNOSIS — D649 Anemia, unspecified: Secondary | ICD-10-CM

## 2015-03-23 MED ORDER — SODIUM CHLORIDE 0.9 % IV SOLN
Freq: Once | INTRAVENOUS | Status: AC
  Administered 2015-03-23: 09:00:00 via INTRAVENOUS

## 2015-03-23 MED ORDER — SODIUM CHLORIDE 0.9 % IV SOLN
510.0000 mg | Freq: Once | INTRAVENOUS | Status: AC
  Administered 2015-03-23: 510 mg via INTRAVENOUS
  Filled 2015-03-23: qty 17

## 2015-03-23 NOTE — Patient Instructions (Signed)

## 2015-03-29 ENCOUNTER — Ambulatory Visit: Payer: Commercial Managed Care - HMO

## 2015-03-30 ENCOUNTER — Ambulatory Visit (HOSPITAL_BASED_OUTPATIENT_CLINIC_OR_DEPARTMENT_OTHER): Payer: Commercial Managed Care - HMO

## 2015-03-30 VITALS — BP 134/69 | HR 69 | Temp 98.5°F | Resp 16

## 2015-03-30 DIAGNOSIS — D649 Anemia, unspecified: Secondary | ICD-10-CM | POA: Diagnosis not present

## 2015-03-30 MED ORDER — SODIUM CHLORIDE 0.9 % IV SOLN
Freq: Once | INTRAVENOUS | Status: AC
  Administered 2015-03-30: 08:00:00 via INTRAVENOUS

## 2015-03-30 MED ORDER — SODIUM CHLORIDE 0.9 % IV SOLN
510.0000 mg | Freq: Once | INTRAVENOUS | Status: AC
  Administered 2015-03-30: 510 mg via INTRAVENOUS
  Filled 2015-03-30: qty 17

## 2015-03-30 NOTE — Patient Instructions (Signed)

## 2015-04-21 ENCOUNTER — Telehealth: Payer: Self-pay | Admitting: Hematology

## 2015-04-21 NOTE — Telephone Encounter (Signed)
[facility] schedule change - moved 8/17 appointments to 8/22. Left message for patient and mailed schedule.

## 2015-04-26 ENCOUNTER — Ambulatory Visit: Payer: Commercial Managed Care - HMO | Admitting: Hematology

## 2015-04-26 ENCOUNTER — Other Ambulatory Visit: Payer: Commercial Managed Care - HMO

## 2015-05-01 ENCOUNTER — Telehealth: Payer: Self-pay | Admitting: Hematology

## 2015-05-01 ENCOUNTER — Other Ambulatory Visit (HOSPITAL_BASED_OUTPATIENT_CLINIC_OR_DEPARTMENT_OTHER): Payer: Commercial Managed Care - HMO

## 2015-05-01 ENCOUNTER — Encounter: Payer: Self-pay | Admitting: Hematology

## 2015-05-01 ENCOUNTER — Ambulatory Visit (HOSPITAL_BASED_OUTPATIENT_CLINIC_OR_DEPARTMENT_OTHER): Payer: Commercial Managed Care - HMO | Admitting: Hematology

## 2015-05-01 VITALS — BP 129/66 | HR 75 | Temp 98.2°F | Resp 18 | Ht 69.0 in | Wt 341.8 lb

## 2015-05-01 DIAGNOSIS — E119 Type 2 diabetes mellitus without complications: Secondary | ICD-10-CM

## 2015-05-01 DIAGNOSIS — I10 Essential (primary) hypertension: Secondary | ICD-10-CM

## 2015-05-01 DIAGNOSIS — D649 Anemia, unspecified: Secondary | ICD-10-CM

## 2015-05-01 DIAGNOSIS — G8929 Other chronic pain: Secondary | ICD-10-CM

## 2015-05-01 DIAGNOSIS — M545 Low back pain: Secondary | ICD-10-CM | POA: Diagnosis not present

## 2015-05-01 DIAGNOSIS — R5383 Other fatigue: Secondary | ICD-10-CM

## 2015-05-01 DIAGNOSIS — E669 Obesity, unspecified: Secondary | ICD-10-CM

## 2015-05-01 LAB — CBC & DIFF AND RETIC
BASO%: 0.1 % (ref 0.0–2.0)
BASOS ABS: 0 10*3/uL (ref 0.0–0.1)
EOS%: 1.8 % (ref 0.0–7.0)
Eosinophils Absolute: 0.1 10*3/uL (ref 0.0–0.5)
HEMATOCRIT: 36.2 % (ref 34.8–46.6)
HGB: 11.5 g/dL — ABNORMAL LOW (ref 11.6–15.9)
Immature Retic Fract: 5.2 % (ref 1.60–10.00)
LYMPH%: 28.8 % (ref 14.0–49.7)
MCH: 26.4 pg (ref 25.1–34.0)
MCHC: 31.8 g/dL (ref 31.5–36.0)
MCV: 83.2 fL (ref 79.5–101.0)
MONO#: 0.4 10*3/uL (ref 0.1–0.9)
MONO%: 5.5 % (ref 0.0–14.0)
NEUT#: 4.8 10*3/uL (ref 1.5–6.5)
NEUT%: 63.8 % (ref 38.4–76.8)
PLATELETS: 310 10*3/uL (ref 145–400)
RBC: 4.35 10*6/uL (ref 3.70–5.45)
RDW: 15.5 % — ABNORMAL HIGH (ref 11.2–14.5)
Retic %: 1.69 % (ref 0.70–2.10)
Retic Ct Abs: 73.52 10*3/uL (ref 33.70–90.70)
WBC: 7.6 10*3/uL (ref 3.9–10.3)
lymph#: 2.2 10*3/uL (ref 0.9–3.3)

## 2015-05-01 NOTE — Telephone Encounter (Signed)
per pof to sch pt appt-gave pt copy of avs °

## 2015-05-01 NOTE — Progress Notes (Signed)
[place]  Telephone:(336) [phone] Fax:(336) [place] consult Note   Patient Care Team: Darcus Austin, MD as PCP - General (Family Medicine) 05/01/2015  CHIEF COMPLAINTS/PURPOSE OF CONSULTATION:  Anemia   HISTORY OF PRESENTING ILLNESS:  Erin [name] 54 y.o. female is here because of chronic anemia   She has had anemia for more than 10 years, probably 15 years. She used to have heavy menstrual period, and she had hysrectomy 9 years ago, and did not improve her anemia. She has been taking iron pill (1-2 tab daily), which did not help with her anemia much. She used to donate blood, but stopped 15 years ago due to anemia. Her labwork in January 2016 showed hemoglobin 11.4, MCV 81.4, normal WBC and platelet count, serum iron 34, TIBC 281, transferrin saturation 12%. She increased her pill to 2 tablets a day since then, her repeated lab work in May 2016 showed hemoglobin 11.2, hematocrit 34.8, serum) 24, TIBC 279, transferrin saturation 9%.  She denies recent chest pain on exertion, shortness of breath on minimal exertion, pre-syncopal episodes, or palpitations. She had not noticed any recent bleeding such as epistaxis, hematuria or hematochezia She is on aspirin 70m daily. She takes ibuprofen 1-2 times a week for back pain ` Her last EGD, capsule endoscopy and colonoscopy was 8 years ago, which were negative except one colon polyp which was removed.  She had no prior history or diagnosis of cancer. Her age appropriate screening programs are up-to-date. She denies any pica and eats a variety of diet.  She has mild fatigue, expecially in the morning. She works for a [employer] She has chronic back pain, no other complains.  INTERIM HISTORY SYennyreturns for follow-up. She tolerated IV Feraheme very well, did notice a bit more energy after the infusion. No other new complaints. She feels well overall.   MEDICAL HISTORY:  Past Medical History  Diagnosis  Date  . Diabetes mellitus   . Hypertension   . H/O hypercholesterolemia   . Microhematuria 2012  . HPV in female   . Anemia   . H/O: menorrhagia   . H/O dysmenorrhea   . Fibroid     Symptomatic  . H/O: obesity   . Yeast vaginitis 2008  . Breast nodule 2009    Right   . H/O hematuria     SURGICAL HISTORY: Past Surgical History  Procedure Laterality Date  . Abdominal hysterectomy    . Tubal ligation  1984    Bilateral  . Hysteroscopy w/d&c  2003  . Polypectomy  2008    SOCIAL HISTORY: Social History   Social History  . Marital Status: Divorced    Spouse Name: N/A  . Number of Children: N/A  . Years of Education: N/A   Occupational History  . Not on file.   Social History Main Topics  . Smoking status: Never Smoker   . Smokeless tobacco: Former [employer] developer . Alcohol Use: No  . Drug Use: No  . Sexual Activity: No     Comment: hysterectomy   Other Topics Concern  . Not on file   Social History Narrative    FAMILY HISTORY: Family History  Problem Relation Age of Onset  . Diabetes Mother   . Diabetes Brother     ALLERGIES:  has No Known Allergies.  MEDICATIONS:  Current Outpatient Prescriptions  Medication Sig Dispense Refill  . aspirin 81 MG tablet Take 81 mg by mouth daily.    .[name]  Cholecalciferol (VITAMIN D-3 PO) Take 1,000 Units by mouth daily.     [name] glipiZIDE (GLUCOTROL XL) 10 MG 24 hr tablet Take 10 mg by mouth daily.  5  . losartan (COZAAR) 50 MG tablet Take 50 mg by mouth daily.  5  . metFORMIN (GLUCOPHAGE-XR) 500 MG 24 hr tablet Take 1,000 mg by mouth 2 (two) times daily.  5  . Multiple Vitamin (MULTIVITAMIN) capsule Take 1 capsule by mouth daily.    . pravastatin (PRAVACHOL) 80 MG tablet Take 80 mg by mouth at bedtime.  5  . traMADol (ULTRAM) 50 MG tablet Take by mouth every 6 (six) hours as needed.    . vitamin C (ASCORBIC ACID) 500 MG tablet Take 500 mg by mouth daily.    . Vitamin D, Ergocalciferol, (DRISDOL) 50000 UNITS CAPS capsule Take 50,000  Units by mouth 2 (two) times a week.    [name] ibuprofen (ADVIL,MOTRIN) 800 MG tablet TAKE 1 TABLET(S) BY MOUTH 2 TIMES A DAY AS NEEDED [PRN]  3   No current facility-administered medications for this visit.    REVIEW OF SYSTEMS:   Constitutional: Denies fevers, chills or abnormal night sweats Eyes: Denies blurriness of vision, double vision or watery eyes Ears, nose, mouth, throat, and face: Denies mucositis or sore throat Respiratory: Denies cough, dyspnea or wheezes Cardiovascular: Denies palpitation, chest discomfort or lower extremity swelling Gastrointestinal:  Denies nausea, heartburn or change in bowel habits Skin: Denies abnormal skin rashes Lymphatics: Denies new lymphadenopathy or easy bruising Neurological:Denies numbness, tingling or new weaknesses Behavioral/Psych: Mood is stable, no new changes  All other systems were reviewed with the patient and are negative.  PHYSICAL EXAMINATION: ECOG PERFORMANCE STATUS: 1 - Symptomatic but completely ambulatory  Filed Vitals:   05/01/15 0936  BP: 129/66  Pulse: 75  Temp: 98.2 F (36.8 C)  Resp: 18   Filed Weights   05/01/15 0936  Weight: 341 lb 12.8 oz (155.039 kg)    GENERAL:alert, no distress and comfortable SKIN: skin color, texture, turgor are normal, no rashes or significant lesions EYES: normal, conjunctiva are pink and non-injected, sclera clear OROPHARYNX:no exudate, no erythema and lips, buccal mucosa, and tongue normal  NECK: supple, thyroid normal size, non-tender, without nodularity LYMPH:  no palpable lymphadenopathy in the cervical, axillary or inguinal LUNGS: clear to auscultation and percussion with normal breathing effort HEART: regular rate & rhythm and no murmurs and no lower extremity edema ABDOMEN:abdomen soft, non-tender and normal bowel sounds Musculoskeletal:no cyanosis of digits and no clubbing  PSYCH: alert & oriented x 3 with fluent speech NEURO: no focal motor/sensory deficits  LABORATORY  DATA:  I have reviewed the data as listed CBC Latest Ref Rng 05/01/2015 03/08/2015  WBC 3.9 - 10.3 10e3/uL 7.6 7.1  Hemoglobin 11.6 - 15.9 g/dL 11.5(L) 11.6  Hematocrit 34.8 - 46.6 % 36.2 36.8  Platelets 145 - 400 10e3/uL 310 347    CMP Latest Ref Rng 03/08/2015  Glucose 70 - 140 mg/dl 126  BUN 7.0 - 26.0 mg/dL 8.4  Creatinine 0.6 - 1.1 mg/dL 0.7  Sodium 136 - 145 mEq/L 140  Potassium 3.5 - 5.1 mEq/L 3.9  CO2 22 - 29 mEq/L 27  Calcium 8.4 - 10.4 mg/dL 9.6  Total Protein 6.4 - 8.3 g/dL 7.2  Total Bilirubin 0.20 - 1.20 mg/dL 0.33  Alkaline Phos 40 - 150 U/L 93  AST 5 - 34 U/L 12  ALT 0 - 55 U/L 13     RADIOGRAPHIC STUDIES: I have personally reviewed  the radiological images as listed and agreed with the findings in the report. No results found.  ASSESSMENT & PLAN:  54 year old female, presented with long-standing history of mild anemia  1. Normocytic hypo-productive anemia, likely anemia of chronic disease  -She has a chronic anemia for over 10 years, with hemoglobin in the range of 11-12, low reticulocyte count normal MCV. -Repeat iron studies showed elevated ferritin at 422, low serum iron 32, saturation 13%, TIBC 243 which is on the low limit, and she did not respond much to IV Feraheme. Her sedimentation rate was elevated. This is more consistent with anemia of chronic disease, possible related to her diabetes -We also checked a her TSH, folic acid, W26, which were all normal. ANA was negative, SPEP was negative. -Giving the chronic mild anemia, normal WBC and platelet count, I think the possibility of primary bone marrow disease is low, I will hold on bone marrow biopsy at this point. -We'll follow her CBC.    2. Diabetes, HTN, obesity -She'll continue follow-up with her primary care physician  Plan -RTC with lab in 6 months -OK to be off oral iron pill for now   All questions were answered. The patient knows to call the clinic with any problems, questions or  concerns. I spent 20 minutes counseling the patient face to face. The total time spent in the appointment was 25 minutes and more than 50% was on counseling.     Truitt Merle, MD 05/01/2015 10:06 AM

## 2015-10-24 ENCOUNTER — Other Ambulatory Visit (HOSPITAL_BASED_OUTPATIENT_CLINIC_OR_DEPARTMENT_OTHER): Payer: Commercial Managed Care - HMO

## 2015-10-24 ENCOUNTER — Telehealth: Payer: Self-pay | Admitting: Hematology

## 2015-10-24 DIAGNOSIS — D649 Anemia, unspecified: Secondary | ICD-10-CM

## 2015-10-24 LAB — CBC & DIFF AND RETIC
BASO%: 0.2 % (ref 0.0–2.0)
Basophils Absolute: 0 10*3/uL (ref 0.0–0.1)
EOS%: 3 % (ref 0.0–7.0)
Eosinophils Absolute: 0.3 10*3/uL (ref 0.0–0.5)
HCT: 35.5 % (ref 34.8–46.6)
HGB: 11.2 g/dL — ABNORMAL LOW (ref 11.6–15.9)
Immature Retic Fract: 9.4 % (ref 1.60–10.00)
LYMPH#: 2.4 10*3/uL (ref 0.9–3.3)
LYMPH%: 28.6 % (ref 14.0–49.7)
MCH: 26 pg (ref 25.1–34.0)
MCHC: 31.5 g/dL (ref 31.5–36.0)
MCV: 82.6 fL (ref 79.5–101.0)
MONO#: 0.5 10*3/uL (ref 0.1–0.9)
MONO%: 5.4 % (ref 0.0–14.0)
NEUT%: 62.8 % (ref 38.4–76.8)
NEUTROS ABS: 5.2 10*3/uL (ref 1.5–6.5)
NRBC: 0 % (ref 0–0)
Platelets: 347 10*3/uL (ref 145–400)
RBC: 4.3 10*6/uL (ref 3.70–5.45)
RDW: 14.2 % (ref 11.2–14.5)
RETIC %: 1.78 % (ref 0.70–2.10)
Retic Ct Abs: 76.54 10*3/uL (ref 33.70–90.70)
WBC: 8.3 10*3/uL (ref 3.9–10.3)

## 2015-10-24 NOTE — Telephone Encounter (Signed)
per pof to sch pt appt-gave pt copy of avs °

## 2015-10-31 ENCOUNTER — Ambulatory Visit: Payer: Commercial Managed Care - HMO | Admitting: Hematology

## 2015-10-31 ENCOUNTER — Telehealth: Payer: Self-pay | Admitting: Hematology

## 2015-10-31 NOTE — Telephone Encounter (Signed)
YF out - rescheduled 2/22 f/u to 3/20. Spoke with patient re change and new appointment for 3/20. Patient reached via cell and asked to disregard message left on home voicemail re 3/24 f/u - patient cannot do Fridays.

## 2015-11-01 ENCOUNTER — Ambulatory Visit: Payer: Commercial Managed Care - HMO | Admitting: Hematology

## 2015-11-27 ENCOUNTER — Ambulatory Visit (HOSPITAL_BASED_OUTPATIENT_CLINIC_OR_DEPARTMENT_OTHER): Payer: Commercial Managed Care - HMO | Admitting: Hematology

## 2015-11-27 ENCOUNTER — Encounter: Payer: Self-pay | Admitting: Hematology

## 2015-11-27 ENCOUNTER — Telehealth: Payer: Self-pay | Admitting: Hematology

## 2015-11-27 VITALS — BP 135/60 | HR 70 | Temp 98.2°F | Resp 16 | Ht 69.0 in | Wt 331.6 lb

## 2015-11-27 DIAGNOSIS — D638 Anemia in other chronic diseases classified elsewhere: Secondary | ICD-10-CM

## 2015-11-27 DIAGNOSIS — E669 Obesity, unspecified: Secondary | ICD-10-CM

## 2015-11-27 DIAGNOSIS — I10 Essential (primary) hypertension: Secondary | ICD-10-CM | POA: Diagnosis not present

## 2015-11-27 DIAGNOSIS — D649 Anemia, unspecified: Secondary | ICD-10-CM

## 2015-11-27 DIAGNOSIS — E119 Type 2 diabetes mellitus without complications: Secondary | ICD-10-CM

## 2015-11-27 NOTE — Telephone Encounter (Signed)
Gave and printed appt sched and avs fo rpt for March 2018

## 2015-11-27 NOTE — Progress Notes (Signed)
[place]  [phone] [phone]) [phone]  Clinic Follow Up Note   Patient Care Team: Erin Austin, MD as PCP - General (Family Medicine) 11/27/2015  CHIEF COMPLAINTS:  Follow Up anemia   HISTORY OF PRESENTING ILLNESS:  Erin [name] 55 y.o. female is here because of chronic anemia   She has had anemia for more than 10 years, probably 15 years. She used to have heavy menstrual period, and she had hysrectomy 9 years ago, and did not improve her anemia. She has been taking iron pill (1-2 tab daily), which did not help with her anemia much. She used to donate blood, but stopped 15 years ago due to anemia. Her labwork in January 2016 showed hemoglobin 11.4, MCV 81.4, normal WBC and platelet count, serum iron 34, TIBC 281, transferrin saturation 12%. She increased her pill to 2 tablets a day since then, her repeated lab work in May 2016 showed hemoglobin 11.2, hematocrit 34.8, serum) 24, TIBC 279, transferrin saturation 9%.  She denies recent chest pain on exertion, shortness of breath on minimal exertion, pre-syncopal episodes, or palpitations. She had not noticed any recent bleeding such as epistaxis, hematuria or hematochezia She is on aspirin 8m daily. She takes ibuprofen 1-2 times a week for back pain ` Her last EGD, capsule endoscopy and colonoscopy was 8 years ago, which were negative except one colon polyp which was removed.  She had no prior history or diagnosis of cancer. Her age appropriate screening programs are up-to-date. She denies any pica and eats a variety of diet.  She has mild fatigue, expecially in the morning. She works for a [employer] She has chronic back pain, no other complains.  CURRENT THERAPY: observation   INTERIM HISTORY SNeriareturns for follow-up. She was last seen by me 6 months ago. She is doing well overall, denies any pain, GI symptoms, bleeding, or other symptoms. She has been seen her primary care physician Dr.  CHuston Foleyevery 3 months, for her diabetes and hypertension. She is lot of fasting food, and does not exercise.  MEDICAL HISTORY:  Past Medical History  Diagnosis Date  . Diabetes mellitus   . Hypertension   . H/O hypercholesterolemia   . Microhematuria 2012  . HPV in female   . Anemia   . H/O: menorrhagia   . H/O dysmenorrhea   . Fibroid     Symptomatic  . H/O: obesity   . Yeast vaginitis 2008  . Breast nodule 2009    Right   . H/O hematuria     SURGICAL HISTORY: Past Surgical History  Procedure Laterality Date  . Abdominal hysterectomy    . Tubal ligation  1984    Bilateral  . Hysteroscopy w/d&c  2003  . Polypectomy  2008    SOCIAL HISTORY: Social History   Social History  . Marital Status: Divorced    Spouse Name: N/A  . Number of Children: N/A  . Years of Education: N/A   Occupational History  . Not on file.   Social History Main Topics  . Smoking status: Never Smoker   . Smokeless tobacco: Former [employer] developer . Alcohol Use: No  . Drug Use: No  . Sexual Activity: No     Comment: hysterectomy   Other Topics Concern  . Not on file   Social History Narrative    FAMILY HISTORY: Family History  Problem Relation Age of Onset  . Diabetes Mother   . Diabetes Brother     ALLERGIES:  has No Known Allergies.  MEDICATIONS:  Current Outpatient Prescriptions  Medication Sig Dispense Refill  . aspirin 81 MG tablet Take 81 mg by mouth daily.    . Cholecalciferol (VITAMIN D-3 PO) Take 1,000 Units by mouth daily.     [name] glipiZIDE (GLUCOTROL XL) 10 MG 24 hr tablet Take 10 mg by mouth daily.  5  . ibuprofen (ADVIL,MOTRIN) 800 MG tablet TAKE 1 TABLET(S) BY MOUTH 2 TIMES A DAY AS NEEDED [PRN]  3  . losartan (COZAAR) 50 MG tablet Take 50 mg by mouth daily.  5  . metFORMIN (GLUCOPHAGE-XR) 500 MG 24 hr tablet Take 1,000 mg by mouth 2 (two) times daily.  5  . Multiple Vitamin (MULTIVITAMIN) capsule Take 1 capsule by mouth daily.    . pravastatin (PRAVACHOL) 80 MG tablet  Take 80 mg by mouth at bedtime.  5  . traMADol (ULTRAM) 50 MG tablet Take by mouth every 6 (six) hours as needed.    . vitamin C (ASCORBIC ACID) 500 MG tablet Take 500 mg by mouth daily.    [name] JANUVIA 100 MG tablet Reported on 11/27/2015  5   No current facility-administered medications for this visit.    REVIEW OF SYSTEMS:   Constitutional: Denies fevers, chills or abnormal night sweats Eyes: Denies blurriness of vision, double vision or watery eyes Ears, nose, mouth, throat, and face: Denies mucositis or sore throat Respiratory: Denies cough, dyspnea or wheezes Cardiovascular: Denies palpitation, chest discomfort or lower extremity swelling Gastrointestinal:  Denies nausea, heartburn or change in bowel habits Skin: Denies abnormal skin rashes Lymphatics: Denies new lymphadenopathy or easy bruising Neurological:Denies numbness, tingling or new weaknesses Behavioral/Psych: Mood is stable, no new changes  All other systems were reviewed with the patient and are negative.  PHYSICAL EXAMINATION: ECOG PERFORMANCE STATUS: 0  Filed Vitals:   11/27/15 1010  BP: 135/60  Pulse: 70  Temp: 98.2 F (36.8 C)  Resp: 16   Filed Weights   11/27/15 1010  Weight: 331 lb 9.6 oz (150.413 kg)    GENERAL:alert, no distress and comfortable SKIN: skin color, texture, turgor are normal, no rashes or significant lesions EYES: normal, conjunctiva are pink and non-injected, sclera clear OROPHARYNX:no exudate, no erythema and lips, buccal mucosa, and tongue normal  NECK: supple, thyroid normal size, non-tender, without nodularity LYMPH:  no palpable lymphadenopathy in the cervical, axillary or inguinal LUNGS: clear to auscultation and percussion with normal breathing effort HEART: regular rate & rhythm and no murmurs and no lower extremity edema ABDOMEN:abdomen soft, non-tender and normal bowel sounds Musculoskeletal:no cyanosis of digits and no clubbing  PSYCH: alert & oriented x 3 with fluent  speech NEURO: no focal motor/sensory deficits  LABORATORY DATA:  I have reviewed the data as listed CBC Latest Ref Rng 10/24/2015 05/01/2015 03/08/2015  WBC 3.9 - 10.3 10e3/uL 8.3 7.6 7.1  Hemoglobin 11.6 - 15.9 g/dL 11.2(L) 11.5(L) 11.6  Hematocrit 34.8 - 46.6 % 35.5 36.2 36.8  Platelets 145 - 400 10e3/uL 347 310 347    CMP Latest Ref Rng 03/08/2015  Glucose 70 - 140 mg/dl 126  BUN 7.0 - 26.0 mg/dL 8.4  Creatinine 0.6 - 1.1 mg/dL 0.7  Sodium 136 - 145 mEq/L 140  Potassium 3.5 - 5.1 mEq/L 3.9  CO2 22 - 29 mEq/L 27  Calcium 8.4 - 10.4 mg/dL 9.6  Total Protein 6.4 - 8.3 g/dL 7.2  Total Bilirubin 0.20 - 1.20 mg/dL 0.33  Alkaline Phos 40 - 150 U/L 93  AST 5 -  34 U/L 12  ALT 0 - 55 U/L 13     RADIOGRAPHIC STUDIES: I have personally reviewed the radiological images as listed and agreed with the findings in the report. No results found.  ASSESSMENT & PLAN:  55 year old female, presented with long-standing history of mild anemia  1. Normocytic hypo-productive anemia, likely anemia of chronic disease  -She has a chronic anemia for over 10 years, with hemoglobin in the range of 11-12, low reticulocyte count normal MCV. -Repeat iron studies showed elevated ferritin at 422, low serum iron 32, saturation 13%, TIBC 243 which is on the low limit, and she did not respond much to IV Feraheme. Her sedimentation rate was elevated. This is more consistent with anemia of chronic disease, possible related to her diabetes -We also checked a her TSH, folic acid, W54, which were all normal. ANA was negative, SPEP was negative. -Giving the chronic mild anemia, normal WBC and platelet count, I think the possibility of primary bone marrow disease is low, I will hold on bone marrow biopsy at this point. -We'll follow her CBC.    2. Diabetes, HTN, obesity -She'll continue follow-up with her primary care physician -I strongly encouraged her to have healthy diet, exercise regularly, and try to lose some  weight. Her anemia may improve if her diabetes  Is better controlled.  Plan -RTC with lab in one months with lab including CBC, CMP and iron study  - I encouraged her to eat healthy,  Be more physically active and try to lose some weight   All questions were answered. The patient knows to call the clinic with any problems, questions or concerns. I spent 10 minutes counseling the patient face to face. The total time spent in the appointment was 15 minutes and more than 50% was on counseling.     Truitt Merle, MD 11/27/2015

## 2016-05-20 ENCOUNTER — Telehealth: Payer: Self-pay | Admitting: Cardiovascular Disease

## 2016-05-20 NOTE — Telephone Encounter (Signed)
Received records from [place] for appointment on 06/13/16 with Dr Oval Linsey.  Records given to [facility] (medical records) for Dr Blenda Mounts schedule on 06/13/16. lp

## 2016-06-13 ENCOUNTER — Encounter: Payer: Self-pay | Admitting: Cardiovascular Disease

## 2016-06-13 ENCOUNTER — Ambulatory Visit (INDEPENDENT_AMBULATORY_CARE_PROVIDER_SITE_OTHER): Payer: Commercial Managed Care - HMO | Admitting: Cardiovascular Disease

## 2016-06-13 VITALS — BP 134/78 | HR 74 | Ht 69.0 in | Wt 335.0 lb

## 2016-06-13 DIAGNOSIS — R0789 Other chest pain: Secondary | ICD-10-CM | POA: Diagnosis not present

## 2016-06-13 DIAGNOSIS — R635 Abnormal weight gain: Secondary | ICD-10-CM

## 2016-06-13 DIAGNOSIS — R0602 Shortness of breath: Secondary | ICD-10-CM

## 2016-06-13 HISTORY — DX: Other chest pain: R07.89

## 2016-06-13 HISTORY — DX: Shortness of breath: R06.02

## 2016-06-13 LAB — T4, FREE: FREE T4: 1 ng/dL (ref 0.8–1.8)

## 2016-06-13 LAB — TSH: TSH: 5.22 mIU/L — ABNORMAL HIGH

## 2016-06-13 NOTE — Progress Notes (Signed)
Cardiology Office Note   Date:  06/13/2016   ID:  MAYVIS OPRY, DOB Jul 18, 1961, MRN [id number]  PCP:  Marjorie Smolder, MD  Cardiologist:   Skeet Latch, MD   Chief Complaint  Patient presents with  . New patient eval    Referred by Darcus Austin, MD--eval for CP  pt c/o SOB--happens randomly; chest discomfort; swelling in left ankle      History of Present Illness: Erin [name] is a 55 y.o. female with diabetes, hypertension, hyperlipidemia and morbid obesity who presents for an evaluation of shortness of breath and chest discomfort. For the last year she has noted intermittent episodes of shortness of breath and chest discomfort.  The episodes occur randomly and not particularly with exertion. She had an event yesterday that happened after she had just returned home and was washing dishes. She noted significant shortness of breath and substernal chest tightness. The symptoms were 5 out of 10 in severity. There is no radiation, diaphoresis, or nausea. This has happened 3 or 4 times over the last year. She has a strenuous job and does not have symptoms at work. One year ago she was exercising on the elliptical for 30 or 40 minutes without symptoms. Over the last year she's been more busy and does not write exercise as regularly. The last time she exercise was 1 month ago and was on the elliptical for approximately 5 minutes and did not have any symptoms. Ms. Burback saw her PCP, Dr. Darcus Austin, on 05/16/16.  At that appointment she reported these symptoms.  She had an EKG that revealed sinus rhythm and no evidence of ischemia.  She was referred to cardiology for further evaluation.  Ms. Schlomer reports intermittent swelling in her left ankle. She notes that it is worse when she has been sitting for prolonged periods of time. She denies orthopnea and general but did have some shortness of breath laying down yesterday after her event. She denies PND.  Past Medical History:  Diagnosis Date  .  Anemia   . Atypical chest pain 06/13/2016  . Breast nodule 2009   Right   . Diabetes mellitus   . Fibroid    Symptomatic  . H/O dysmenorrhea   . H/O hematuria   . H/O hypercholesterolemia   . H/O: menorrhagia   . H/O: obesity   . HPV in female   . Hypertension   . Microhematuria 2012  . Shortness of breath 06/13/2016  . Yeast vaginitis 2008    Past Surgical History:  Procedure Laterality Date  . ABDOMINAL HYSTERECTOMY    . HYSTEROSCOPY W/D&C  2003  . POLYPECTOMY  2008  . TUBAL LIGATION  1984   Bilateral     Current Outpatient Prescriptions  Medication Sig Dispense Refill  . aspirin 81 MG tablet Take 81 mg by mouth daily.    . Cholecalciferol (VITAMIN D-3 PO) Take 1,000 Units by mouth daily.     [name] glipiZIDE (GLUCOTROL XL) 10 MG 24 hr tablet Take 10 mg by mouth daily.  5  . ibuprofen (ADVIL,MOTRIN) 800 MG tablet TAKE 1 TABLET(S) BY MOUTH 2 TIMES A DAY AS NEEDED [PRN]  3  . JANUVIA 100 MG tablet Reported on 11/27/2015  5  . losartan (COZAAR) 50 MG tablet Take 50 mg by mouth daily.  5  . metFORMIN (GLUCOPHAGE-XR) 500 MG 24 hr tablet Take 1,000 mg by mouth 2 (two) times daily.  5  . Multiple Vitamin (MULTIVITAMIN) capsule Take 1 capsule by  mouth daily.    . pravastatin (PRAVACHOL) 80 MG tablet Take 80 mg by mouth at bedtime.  5  . traMADol (ULTRAM) 50 MG tablet Take by mouth every 6 (six) hours as needed.    . vitamin C (ASCORBIC ACID) 500 MG tablet Take 500 mg by mouth daily.    . Vitamin D, Ergocalciferol, (DRISDOL) 50000 units CAPS capsule Take 1 capsule by mouth 2 (two) times a week.  5   No current facility-administered medications for this visit.     Allergies:   Review of patient's allergies indicates no known allergies.    Social History:  The patient  reports that she has never smoked. She has never used smokeless tobacco. She reports that she does not drink alcohol or use drugs.   Family History:  The patient's family history includes Diabetes in her brother and  mother; Hypertension in her father; Valvular heart disease in her mother.    ROS:  Please see the history of present illness.   Otherwise, review of systems are positive for none.   All other systems are reviewed and negative.    PHYSICAL EXAM: VS:  BP 134/78 (BP Location: Left Arm, Patient Position: Sitting, Cuff Size: Large)   Pulse 74   Ht 5\' 9"  (1.753 m)   Wt (!) 335 lb (152 kg)   BMI 49.47 kg/m  , BMI Body mass index is 49.47 kg/m. GENERAL:  Well appearing HEENT:  Pupils equal round and reactive, fundi not visualized, oral mucosa unremarkable NECK:  No jugular venous distention, waveform within normal limits, carotid upstroke brisk and symmetric, no bruits, no thyromegaly LYMPHATICS:  No cervical adenopathy LUNGS:  Clear to auscultation bilaterally HEART:  RRR.  PMI not displaced or sustained,S1 and S2 within normal limits, no S3, no S4, no clicks, no rubs, no murmurs ABD:  Flat, positive bowel sounds normal in frequency in pitch, no bruits, no rebound, no guarding, no midline pulsatile mass, no hepatomegaly, no splenomegaly EXT:  2 plus pulses throughout, 1+ pitting edema, no cyanosis no clubbing SKIN:  No rashes no nodules NEURO:  Cranial nerves II through XII grossly intact, motor grossly intact throughout PSYCH:  Cognitively intact, oriented to person place and time   EKG:  EKG is not ordered today. 05/16/16: Sinus rhythm. Rate 68 bpm. Voltage.  Recent Labs: 10/24/2015: HGB 11.2; Platelets 347   05/16/16: Sodium 138, potassium 3.9, BUN 10, creatinine 0.55 03/20/16: Hemoglobin A1c 7.3% WBC 8.2, hemoglobin 11.2, hematocrit 35.7, platelets 328 Total cholesterol 179, triglycerides 90, HDL 48, LDL 113  Lipid Panel No results found for: CHOL, TRIG, HDL, CHOLHDL, VLDL, LDLCALC, LDLDIRECT    Wt Readings from Last 3 Encounters:  06/13/16 (!) 335 lb (152 kg)  11/27/15 (!) 331 lb 9.6 oz (150.4 kg)  05/01/15 (!) 341 lb 12.8 oz (155 kg)      ASSESSMENT AND PLAN:  # Atypical  chest pain: Ms. Grussing's symptoms are very atypical. She does not have exertional symptoms, making it less likely to be ischemia. However, she does have several risk factors including diabetes, hypertension, and hyperlipidemia. Therefore, we will obtain an ETT to evaluate for ischemia. Continue aspirin.  She is also concerned that it could be attributable to her thyroid.  We will check a TSH and free T4.  # Hypertension:  Blood pressure is well-controlled on losartan.  # Hyperlipidemia: Continue pravastatin.  LDL 113.  Will discuss switching to a different statin at her next appointment.  Ideally this would be <70.  #  Morbid obesity: Discussed the importance of regular exercise and making dietary modification.  We discussed meal planning and exercising at least 30-40 minutes most days of the week.   Current medicines are reviewed at length with the patient today.  The patient does not have concerns regarding medicines.  The following changes have been made:  no change  Labs/ tests ordered today include:   Orders Placed This Encounter  Procedures  . TSH  . T4, free  . Exercise Tolerance Test     Disposition:   FU with Flynt Breeze C. Oval Linsey, MD, [facility] in 1 month.     This note was written with the assistance of speech recognition software.  Please excuse any transcriptional errors.  Signed, Shiraz Bastyr C. Oval Linsey, MD, [facility]  06/13/2016 8:49 AM    [facility]

## 2016-06-13 NOTE — Patient Instructions (Addendum)
Your physician recommends that you continue on your current medications as directed. Please refer to the Current Medication list given to you.  Your physician has requested that you have an exercise tolerance test. For further information please visit [email or web address]. Please also follow instruction sheet, as given.  Your physician recommends that you GO for lab work TODAY [facility] AND [facility])   Your physician recommends that you schedule a follow-up appointment in: ONE MONTH.    Exercise Stress Electrocardiogram An exercise stress electrocardiogram is a test to check how blood flows to your heart. It is done to find areas of poor blood flow. You will need to walk on a treadmill for this test. The electrocardiogram will record your heartbeat when you are at rest and when you are exercising. BEFORE THE PROCEDURE  Do not have drinks with caffeine or foods with caffeine for 24 hours before the test, or as told by your doctor. This includes coffee, tea (even decaf tea), sodas, chocolate, and cocoa.  Follow your doctor's instructions about eating and drinking before the test.  Ask your doctor what medicines you should or should not take before the test. Take your medicines with water unless told by your doctor not to.  If you use an inhaler, bring it with you to the test.  Bring a snack to eat after the test.  Do not  smoke for 4 hours before the test.  Do not put lotions, powders, creams, or oils on your chest before the test.  Wear comfortable shoes and clothing. PROCEDURE  You will have patches put on your chest. Small areas of your chest may need to be shaved. Wires will be connected to the patches.  Your heart rate will be watched while you are resting and while you are exercising.  You will walk on the treadmill. The treadmill will slowly get faster to raise your heart rate.  The test will take about 1-2 hours. AFTER THE PROCEDURE  Your heart rate and blood pressure will be  watched after the test.  You may return to your normal diet, activities, and medicines or as told by your doctor.   This information is not intended to replace advice given to you by your health care provider. Make sure you discuss any questions you have with your health care provider.   Document Released: 02/12/2008 Document Revised: 09/16/2014 Document Reviewed: 05/03/2013 Elsevier Interactive Patient Education [employer].

## 2016-06-18 ENCOUNTER — Telehealth: Payer: Self-pay | Admitting: Cardiovascular Disease

## 2016-06-18 NOTE — Telephone Encounter (Signed)
Mrs. Toomey is returning a call about her lab work

## 2016-06-18 NOTE — Telephone Encounter (Signed)
Results and recommendations to follow up w Dr. Inda Merlin discussed, patient verbalized understanding. Result notes updated.

## 2016-06-19 ENCOUNTER — Telehealth (HOSPITAL_COMMUNITY): Payer: Self-pay

## 2016-06-19 NOTE — Telephone Encounter (Signed)
Encounter complete. 

## 2016-06-21 ENCOUNTER — Ambulatory Visit (HOSPITAL_COMMUNITY)
Admission: RE | Admit: 2016-06-21 | Discharge: 2016-06-21 | Disposition: A | Discharge status: Home / Self Care | Payer: Commercial Managed Care - HMO | Source: Ambulatory Visit | Attending: Cardiology | Admitting: Cardiology

## 2016-06-21 DIAGNOSIS — R0789 Other chest pain: Secondary | ICD-10-CM | POA: Diagnosis present

## 2016-06-21 DIAGNOSIS — R0602 Shortness of breath: Secondary | ICD-10-CM | POA: Diagnosis not present

## 2016-06-21 LAB — EXERCISE TOLERANCE TEST
CHL CUP MPHR: 165 {beats}/min
CHL CUP RESTING HR STRESS: 72 {beats}/min
CHL RATE OF PERCEIVED EXERTION: 18
CSEPEDS: 28 s
CSEPHR: 95 %
CSEPPHR: 157 {beats}/min
Estimated workload: 7.6 METS
Exercise duration (min): 6 min

## 2016-06-26 ENCOUNTER — Telehealth: Payer: Self-pay | Admitting: *Deleted

## 2016-06-26 NOTE — Telephone Encounter (Signed)
Left message to call back  

## 2016-06-26 NOTE — Telephone Encounter (Signed)
-----   Message from Skeet Latch, MD sent at 06/21/2016  4:27 PM EDT ----- Low risk stress test.

## 2016-07-02 NOTE — Telephone Encounter (Signed)
Notes Recorded by Earvin Hansen on 07/02/2016 at 2:10 PM EDT Advised patient

## 2016-07-16 ENCOUNTER — Encounter: Payer: Self-pay | Admitting: Cardiovascular Disease

## 2016-07-18 ENCOUNTER — Encounter: Payer: Self-pay | Admitting: Cardiovascular Disease

## 2016-07-18 ENCOUNTER — Ambulatory Visit (INDEPENDENT_AMBULATORY_CARE_PROVIDER_SITE_OTHER): Payer: Commercial Managed Care - HMO | Admitting: Cardiovascular Disease

## 2016-07-18 VITALS — BP 105/65 | HR 69 | Ht 69.0 in | Wt 333.4 lb

## 2016-07-18 DIAGNOSIS — R0789 Other chest pain: Secondary | ICD-10-CM | POA: Diagnosis not present

## 2016-07-18 DIAGNOSIS — E7800 Pure hypercholesterolemia, unspecified: Secondary | ICD-10-CM

## 2016-07-18 DIAGNOSIS — I119 Hypertensive heart disease without heart failure: Secondary | ICD-10-CM

## 2016-07-18 MED ORDER — ROSUVASTATIN CALCIUM 10 MG PO TABS: 10.0000 mg | ORAL_TABLET | Freq: Every day | ORAL | 3 refills | Status: DC

## 2016-07-18 NOTE — Progress Notes (Signed)
Cardiology Office Note   Date:  07/19/2016   ID:  Erin [name], DOB 09-11-1960, MRN [id number]  PCP:  Erin Smolder, MD  Cardiologist:   Erin Latch, MD   Chief Complaint  Patient presents with  . Follow-up    pt has no cardiac complaints today       History of Present Illness: Erin [name] is a 55 y.o. female with diabetes, hypertension, hyperlipidemia and morbid obesity who presents for follow up on shortness of breath and chest discomfort.   She was seen in clinic 06/13/16 for intermittent episodes of shortness of breath and chest discomfort.  The episodes occur randomly and not particularly with exertion. She was referred for an ETT that was negative for ischemia.  She achieved 7.6 METS on a Bruce Protocol and was noted to have monomorphic PVCs during recovery.    [name] has been doing well since her last appointment.  She denies chest pain and thinks that her shortness of breath has improved.  She hasn't noted any palpitations, lightheadedness, dizziness, lower extremity edema, orthopnea or PND.  She hasn't been exercising as planned. 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  By the time she gets home she doesn't feel like exercising.  [name] reports developing myalgias on atorvastatin and simvastatin.  She has tolerated pravastatin well.     Past Medical History:  Diagnosis Date  . Anemia   . Atypical chest pain 06/13/2016  . Breast nodule 2009   Right   . Diabetes mellitus   . Fibroid    Symptomatic  . H/O dysmenorrhea   . H/O hematuria   . H/O hypercholesterolemia   . H/O: menorrhagia   . H/O: obesity   . HPV in female   . Hypertension   . Microhematuria 2012  . Shortness of breath 06/13/2016  . Yeast vaginitis 2008    Past Surgical History:  Procedure Laterality Date  . ABDOMINAL HYSTERECTOMY    . HYSTEROSCOPY W/D&C  2003  . POLYPECTOMY  2008  . TUBAL LIGATION  1984   Bilateral     Current Outpatient Prescriptions  Medication  Sig Dispense Refill  . aspirin 81 MG tablet Take 81 mg by mouth daily.    . Cholecalciferol (VITAMIN D-3 PO) Take 1,000 Units by mouth daily.     [name] glipiZIDE (GLUCOTROL XL) 10 MG 24 hr tablet Take 10 mg by mouth daily.  5  . ibuprofen (ADVIL,MOTRIN) 800 MG tablet TAKE 1 TABLET(S) BY MOUTH 2 TIMES A DAY AS NEEDED [PRN]  3  . losartan (COZAAR) 50 MG tablet Take 50 mg by mouth daily.  5  . metFORMIN (GLUCOPHAGE-XR) 500 MG 24 hr tablet Take 1,000 mg by mouth 2 (two) times daily.  5  . Multiple Vitamin (MULTIVITAMIN) capsule Take 1 capsule by mouth daily.    . traMADol (ULTRAM) 50 MG tablet Take by mouth every 6 (six) hours as needed.    . vitamin C (ASCORBIC ACID) 500 MG tablet Take 500 mg by mouth daily.    . rosuvastatin (CRESTOR) 10 MG tablet Take 1 tablet (10 mg total) by mouth daily. 90 tablet 3   No current facility-administered medications for this visit.     Allergies:   Patient has no known allergies.    Social History:  The patient  reports that she has never smoked. She has never used smokeless tobacco. She reports that she does not drink alcohol or use drugs.  Family History:  The patient's family history includes Diabetes in her brother and mother; Hypertension in her father; Valvular heart disease in her mother.    ROS:  Please see the history of present illness.   Otherwise, review of systems are positive for none.   All other systems are reviewed and negative.    PHYSICAL EXAM: VS:  BP 105/65 (BP Location: Right Wrist, Patient Position: Sitting, Cuff Size: Normal)   Pulse 69   Ht 5\' 9"  (1.753 m)   Wt (!) 151.2 kg (333 lb 6.4 oz)   SpO2 97%   BMI 49.23 kg/m  , BMI Body mass index is 49.23 kg/m. GENERAL:  Well appearing HEENT:  Pupils equal round and reactive, fundi not visualized, oral mucosa unremarkable NECK:  No jugular venous distention, waveform within normal limits, carotid upstroke brisk and symmetric, no bruits LYMPHATICS:  No cervical adenopathy LUNGS:   Clear to auscultation bilaterally HEART:  RRR.  PMI not displaced or sustained,S1 and S2 within normal limits, no S3, no S4, no clicks, no rubs, no murmurs ABD:  Flat, positive bowel sounds normal in frequency in pitch, no bruits, no rebound, no guarding, no midline pulsatile mass, no hepatomegaly, no splenomegaly EXT:  2 plus pulses throughout, 1+ pitting edema, no cyanosis no clubbing SKIN:  No rashes no nodules NEURO:  Cranial nerves II through XII grossly intact, motor grossly intact throughout PSYCH:  Cognitively intact, oriented to person place and time   EKG:  EKG is not ordered today. 05/16/16: Sinus rhythm. Rate 68 bpm. Voltage.  ETT 06/21/16:  Blood pressure demonstrated a normal response to exercise.  There was no ST segment deviation noted during stress.  No T wave inversion was noted during stress.   Normal ECG stress test. Frequent monomorphic PVCs are seen during recovery  Recent Labs: 10/24/2015: HGB 11.2; Platelets 347 06/13/2016: TSH 5.22   05/16/16: Sodium 138, potassium 3.9, BUN 10, creatinine 0.55 03/20/16: Hemoglobin A1c 7.3% WBC 8.2, hemoglobin 11.2, hematocrit 35.7, platelets 328 Total cholesterol 179, triglycerides 90, HDL 48, LDL 113  Lipid Panel No results found for: CHOL, TRIG, HDL, CHOLHDL, VLDL, LDLCALC, LDLDIRECT    Wt Readings from Last 3 Encounters:  07/18/16 (!) 151.2 kg (333 lb 6.4 oz)  06/13/16 (!) 152 kg (335 lb)  11/27/15 (!) 150.4 kg (331 lb 9.6 oz)      ASSESSMENT AND PLAN:  # Atypical chest pain: Symptoms have resolved and stress test was negative.   # Hypertension:  Blood pressure is well-controlled on losartan.  # Hyperlipidemia: LDL is 113.  Goal is at least <100, if not <70.  She hasn't tolerated atorvastatin or simvastatin.  We will try rosuvastatin 10mg .  Repeat lipids and CMP in 6 weeks.  She will likely need a higher dose to achieve target.   # Morbid obesity: Discussed the importance of regular exercise and making dietary  modification.  We discussed meal planning and exercising at least 30-40 minutes most days of the week.   Current medicines are reviewed at length with the patient today.  The patient does not have concerns regarding medicines.  The following changes have been made:  Switch pravastatin to rosuvastatin Labs/ tests ordered today include:   No orders of the defined types were placed in this encounter.    Disposition:   FU with Denali Sharma C. Oval Linsey, MD, [facility] in 3 months   This note was written with the assistance of speech recognition software.  Please excuse any transcriptional errors.  Signed,  Claudie Rathbone C. Oval Linsey, MD, [facility]  07/19/2016 9:09 AM    [facility]

## 2016-07-18 NOTE — Patient Instructions (Signed)
Your physician has recommended you make the following change in your medication:   1.) STOP the pravastatin. This has been replaced with rosuvastatin 10 mg.  Your physician recommends that you schedule a follow-up appointment in: 3 months.

## 2016-10-02 DIAGNOSIS — E559 Vitamin D deficiency, unspecified: Secondary | ICD-10-CM | POA: Diagnosis not present

## 2016-10-02 DIAGNOSIS — Z7984 Long term (current) use of oral hypoglycemic drugs: Secondary | ICD-10-CM | POA: Diagnosis not present

## 2016-10-02 DIAGNOSIS — I10 Essential (primary) hypertension: Secondary | ICD-10-CM | POA: Diagnosis not present

## 2016-10-02 DIAGNOSIS — E7800 Pure hypercholesterolemia, unspecified: Secondary | ICD-10-CM | POA: Diagnosis not present

## 2016-10-02 DIAGNOSIS — E1165 Type 2 diabetes mellitus with hyperglycemia: Secondary | ICD-10-CM | POA: Diagnosis not present

## 2016-10-21 ENCOUNTER — Ambulatory Visit: Payer: Commercial Managed Care - HMO | Admitting: Cardiovascular Disease

## 2016-10-21 NOTE — Progress Notes (Signed)
Cardiology Office Note   Date:  10/22/2016   ID:  Erin [name], DOB 09-03-1961, MRN [id number]  PCP:  Marjorie Smolder, MD  Cardiologist:   Skeet Latch, MD   Chief Complaint  Patient presents with  . Shortness of Breath    occassioally.  . Leg Pain    heaviness in legs  . Edema    in ankles at night.      History of Present Illness: Erin [name] is a 56 y.o. female with diabetes, hypertension, hyperlipidemia and morbid obesity who presents for follow up on shortness of breath and chest discomfort.   She was seen in clinic 06/13/16 for intermittent episodes of shortness of breath and chest discomfort.  The episodes occur randomly and not particularly with exertion. She was referred for an ETT that was negative for ischemia.  She achieved 7.6 METS on a Bruce Protocol and was noted to have monomorphic PVCs during recovery.    Erin [name] has been doing well since her last appointment.  She denies chest pain and thinks that her shortness of breath has improved.  However she has noted episodes of palpitations.  Yesterday while at work she felt palpitations that were associated with a squeezing sensation in her chest.  It lasted for an hour and improved once she got home.  There was associated shortness of breath.  This recurred over the weekend while she was at rest.  There was no Lightheadedness, dizziness, nausea or diaphoresis.     Past Medical History:  Diagnosis Date  . Anemia   . Atypical chest pain 06/13/2016  . Breast nodule 2009   Right   . Diabetes mellitus   . Fibroid    Symptomatic  . H/O dysmenorrhea   . H/O hematuria   . H/O hypercholesterolemia   . H/O: menorrhagia   . H/O: obesity   . HPV in female   . Hypertension   . Microhematuria 2012  . Shortness of breath 06/13/2016  . Yeast vaginitis 2008    Past Surgical History:  Procedure Laterality Date  . ABDOMINAL HYSTERECTOMY    . HYSTEROSCOPY W/D&C  2003  . POLYPECTOMY  2008  . TUBAL LIGATION  1984   Bilateral     Current Outpatient Prescriptions  Medication Sig Dispense Refill  . aspirin 81 MG tablet Take 81 mg by mouth daily.    . Cholecalciferol (VITAMIN D-3 PO) Take 1,000 Units by mouth daily.     [name] glipiZIDE (GLUCOTROL XL) 10 MG 24 hr tablet Take 10 mg by mouth daily.  5  . ibuprofen (ADVIL,MOTRIN) 800 MG tablet TAKE 1 TABLET(S) BY MOUTH 2 TIMES A DAY AS NEEDED [PRN]  3  . losartan (COZAAR) 50 MG tablet Take 50 mg by mouth daily.  5  . metFORMIN (GLUCOPHAGE-XR) 500 MG 24 hr tablet Take 1,000 mg by mouth 2 (two) times daily.  5  . Multiple Vitamin (MULTIVITAMIN) capsule Take 1 capsule by mouth daily.    . traMADol (ULTRAM) 50 MG tablet Take by mouth every 6 (six) hours as needed.    . vitamin C (ASCORBIC ACID) 500 MG tablet Take 500 mg by mouth daily.    . rosuvastatin (CRESTOR) 10 MG tablet Take 1 tablet (10 mg total) by mouth daily. 90 tablet 3   No current facility-administered medications for this visit.     Allergies:   Patient has no known allergies.    Social History:  The patient  reports that she has  never smoked. She has never used smokeless tobacco. She reports that she does not drink alcohol or use drugs.   Family History:  The patient's family history includes Diabetes in her brother and mother; Hypertension in her father; Valvular heart disease in her mother.    ROS:  Please see the history of present illness.   Otherwise, review of systems are positive for none.   All other systems are reviewed and negative.    PHYSICAL EXAM: VS:  BP (!) 147/72   Pulse 73   Ht 5\' 9"  (1.753 m)   Wt (!) 151.3 kg (333 lb 9.6 oz)   BMI 49.26 kg/m  , BMI Body mass index is 49.26 kg/m. GENERAL:  Well appearing HEENT:  Pupils equal round and reactive, fundi not visualized, oral mucosa unremarkable NECK:  JVP 2 cm above clavicle at 45 degrees, waveform within normal limits, carotid upstroke brisk and symmetric, no bruits LYMPHATICS:  No cervical adenopathy LUNGS:  Clear to  auscultation bilaterally HEART:  RRR.  PMI not displaced or sustained,S1 and S2 within normal limits, no S3, no S4, no clicks, no rubs, no murmurs ABD:  Flat, positive bowel sounds normal in frequency in pitch, no bruits, no rebound, no guarding, no midline pulsatile mass, no hepatomegaly, no splenomegaly EXT:  2 plus pulses throughout, 1+ pitting edema, no cyanosis no clubbing SKIN:  No rashes no nodules NEURO:  Cranial nerves II through XII grossly intact, motor grossly intact throughout PSYCH:  Cognitively intact, oriented to person place and time   EKG:  EKG is not ordered today. 05/16/16: Sinus rhythm. Rate 68 bpm. Voltage.  ETT 06/21/16:  Blood pressure demonstrated a normal response to exercise.  There was no ST segment deviation noted during stress.  No T wave inversion was noted during stress.   Normal ECG stress test. Frequent monomorphic PVCs are seen during recovery  Recent Labs: 10/24/2015: HGB 11.2; Platelets 347 06/13/2016: TSH 5.22   05/16/16: Sodium 138, potassium 3.9, BUN 10, creatinine 0.55 03/20/16: Hemoglobin A1c 7.3% WBC 8.2, hemoglobin 11.2, hematocrit 35.7, platelets 328 Total cholesterol 179, triglycerides 90, HDL 48, LDL 113  10/04/16: Sodium 139, potassium 4.1, BUN 12, creatinine 0.54 Total cholesterol 138, triglycerides 78, HDL 43, LDL 80 A1c 8.0%   Lipid Panel No results found for: CHOL, TRIG, HDL, CHOLHDL, VLDL, LDLCALC, LDLDIRECT    Wt Readings from Last 3 Encounters:  10/22/16 (!) 151.3 kg (333 lb 9.6 oz)  07/18/16 (!) 151.2 kg (333 lb 6.4 oz)  06/13/16 (!) 152 kg (335 lb)      ASSESSMENT AND PLAN:  # PVCs:  Erin [name] reports palpitations and was noted to have PVCs during stress test. We will start metoprolol 25mg  bid  # Atypical chest pain: Symptoms are atypical and stress test was negative.  Starting metoprolol as above.   # Hypertension:  BP above goal.  Continue losartan and start metoprolol as above  # Hyperlipidemia: LDL is 113.   Goal is at least <100, if not <70.  She hasn't tolerated atorvastatin or simvastatin.  We will try rosuvastatin 10mg .  Repeat lipids and CMP in 6 weeks.  She will likely need a higher dose to achieve target.   # Morbid obesity: Discussed the importance of regular exercise and making dietary modification.  We discussed meal planning and exercising at least 30-40 minutes most days of the week.  # LE Edema: Erin [name] was noted to have lower extremity edema on exam and his JVP is slightly elevated.  We will obtain an echocardiogram to better assess.   Current medicines are reviewed at length with the patient today.  The patient does not have concerns regarding medicines.  The following changes have been made:  Switch pravastatin to rosuvastatin Labs/ tests ordered today include:   No orders of the defined types were placed in this encounter.    Disposition:   FU with Nahomi Hegner C. Oval Linsey, MD, [facility] in 1 month   This note was written with the assistance of speech recognition software.  Please excuse any transcriptional errors.  Signed, Joyclyn Plazola C. Oval Linsey, MD, [facility]  10/22/2016 9:04 AM    [place]

## 2016-10-22 ENCOUNTER — Ambulatory Visit (INDEPENDENT_AMBULATORY_CARE_PROVIDER_SITE_OTHER): Payer: Commercial Managed Care - HMO | Admitting: Cardiovascular Disease

## 2016-10-22 ENCOUNTER — Encounter: Payer: Self-pay | Admitting: Cardiovascular Disease

## 2016-10-22 VITALS — BP 144/82 | HR 73 | Ht 69.0 in | Wt 333.6 lb

## 2016-10-22 DIAGNOSIS — I493 Ventricular premature depolarization: Secondary | ICD-10-CM | POA: Diagnosis not present

## 2016-10-22 DIAGNOSIS — R600 Localized edema: Secondary | ICD-10-CM

## 2016-10-22 DIAGNOSIS — R0602 Shortness of breath: Secondary | ICD-10-CM

## 2016-10-22 DIAGNOSIS — R0789 Other chest pain: Secondary | ICD-10-CM

## 2016-10-22 MED ORDER — METOPROLOL TARTRATE 25 MG PO TABS: 25.0000 mg | ORAL_TABLET | Freq: Two times a day (BID) | ORAL | 1 refills | Status: DC

## 2016-10-22 NOTE — Patient Instructions (Signed)
Medication Instructions:  START METOPROLOL 25 MG TWICE A DAY   Labwork: NONE  Testing/Procedures: Your physician has requested that you have an echocardiogram. Echocardiography is a painless test that uses sound waves to create images of your heart. It provides your doctor with information about the size and shape of your heart and how well your heart's chambers and valves are working. This procedure takes approximately one hour. There are no restrictions for this procedure. [place]  Follow-Up: Your physician recommends that you schedule a follow-up appointment in: [place]  If you need a refill on your cardiac medications before your next appointment, please call your pharmacy.

## 2016-11-07 ENCOUNTER — Other Ambulatory Visit: Payer: Self-pay

## 2016-11-07 ENCOUNTER — Ambulatory Visit (HOSPITAL_COMMUNITY): Payer: Commercial Managed Care - HMO | Attending: Cardiology

## 2016-11-07 DIAGNOSIS — Z6841 Body Mass Index (BMI) 40.0 and over, adult: Secondary | ICD-10-CM | POA: Diagnosis not present

## 2016-11-07 DIAGNOSIS — I10 Essential (primary) hypertension: Secondary | ICD-10-CM | POA: Diagnosis not present

## 2016-11-07 DIAGNOSIS — E119 Type 2 diabetes mellitus without complications: Secondary | ICD-10-CM | POA: Insufficient documentation

## 2016-11-07 DIAGNOSIS — R600 Localized edema: Secondary | ICD-10-CM

## 2016-11-07 DIAGNOSIS — E785 Hyperlipidemia, unspecified: Secondary | ICD-10-CM | POA: Insufficient documentation

## 2016-11-20 ENCOUNTER — Telehealth: Payer: Self-pay | Admitting: Hematology

## 2016-11-20 NOTE — Telephone Encounter (Signed)
Left message with new appt time and date. Sent reminder letter out.

## 2016-11-22 ENCOUNTER — Ambulatory Visit (INDEPENDENT_AMBULATORY_CARE_PROVIDER_SITE_OTHER): Payer: Self-pay

## 2016-11-22 ENCOUNTER — Ambulatory Visit (INDEPENDENT_AMBULATORY_CARE_PROVIDER_SITE_OTHER): Payer: Worker's Compensation | Admitting: Orthopaedic Surgery

## 2016-11-22 ENCOUNTER — Encounter (INDEPENDENT_AMBULATORY_CARE_PROVIDER_SITE_OTHER): Payer: Self-pay | Admitting: Orthopaedic Surgery

## 2016-11-22 VITALS — BP 131/71 | HR 88 | Ht 69.0 in | Wt 330.0 lb

## 2016-11-22 DIAGNOSIS — G8929 Other chronic pain: Secondary | ICD-10-CM | POA: Diagnosis not present

## 2016-11-22 DIAGNOSIS — M5442 Lumbago with sciatica, left side: Secondary | ICD-10-CM

## 2016-11-22 NOTE — Progress Notes (Signed)
Office Visit Note   Patient: Erin [name]           Date of Birth: 01-16-1961           MRN: [id number] Visit Date: 11/22/2016              Requested by: Darcus Austin, MD [address], [place] [address] PCP: Marjorie Smolder, MD   Assessment & Plan: Visit Diagnoses:  1. Chronic left-sided low back pain with left-sided sciatica   2. Morbid (severe) obesity due to excess calories [facility])     Plan: Patient has some degenerative anterolisthesis at L4-5. She needs to continue to work on weight loss, dieting, exercise program to help unload her back as well as her knees. She does not have any claudication symptoms at this time. We discussed the benefit overall for her other medical problems including high cholesterol, diabetes, hypertension that should all improve with continued weight loss. We discussed symptoms of neurogenic claudication of the develop she can return.  Follow-Up Instructions: Return if symptoms worsen or fail to improve.   Orders:  Orders Placed This Encounter  Procedures  . XR Lumbar Spine 2-3 Views   No orders of the defined types were placed in this encounter.     Procedures: No procedures performed   Clinical Data: No additional findings.   Subjective: Chief Complaint  Patient presents with  . Lower Back - Pain    Patient returns today with continued low back pain. Patient last seen 05/17/15 for this condition. She started having back pain since she was injured on the job 12/21/2008.  She states that recently her back pain has gotten significantly worse and reports that she now is having severe left leg pain and weakness in her leg.  At times her leg gives way.  She is having numbness and tingling sensation in her left lower extremity as well.    Patient denies excessive bowel bladder symptoms no fever chills. BMI elevated at 48. Patient is on oral medication for diabetes.  Review of Systems  Constitutional: Negative for  chills and diaphoresis.  HENT: Negative for ear discharge, ear pain and nosebleeds.   Eyes: Negative for discharge and visual disturbance.  Respiratory: Negative for cough, choking and shortness of breath.   Cardiovascular: Negative for chest pain and palpitations.       High cholesterol. Recent stress test which showed some PVCs. No ST segment elevation.  Gastrointestinal: Negative for abdominal distention and abdominal pain.  Endocrine: Negative for cold intolerance and heat intolerance.       Positive for type 2 diabetes.  Genitourinary: Negative for flank pain and hematuria.  Musculoskeletal:       Chronic back pain times more than 10 years. Last year symptoms of been worse. She has some the knee symptoms as well. She has back pain and left leg pain. Denies claudication symptoms.  Skin: Negative for rash and wound.  Neurological: Negative for seizures and speech difficulty.  Hematological: Negative for adenopathy. Does not bruise/bleed easily.  Psychiatric/Behavioral: Negative for agitation and suicidal ideas.     Objective: Vital Signs: BP 131/71 (BP Location: Left Arm, Patient Position: Sitting)   Pulse 88   Ht 5\' 9"  (1.753 m)   Wt (!) 330 lb (149.7 kg)   BMI 48.73 kg/m   Physical Exam  Constitutional: She is oriented to person, place, and time. She appears well-developed.  Truncal obesity with BMI  48.73.  HENT:  Head: Normocephalic.  Right Ear: External ear normal.  Left Ear: External ear normal.  Eyes: Pupils are equal, round, and reactive to light.  Neck: No tracheal deviation present. No thyromegaly present.  Cardiovascular: Normal rate.   Pulmonary/Chest: Effort normal.  Abdominal: Soft.  Musculoskeletal:  Patient slow to get from sitting standing walks with slow deliberate gait. She has some crepitus with knee extension bilaterally. No pain with hip range of motion. Reflexes are intact knee and ankle jerk. Bilateral edema lower extremities. These reach full  extension. Quads hip flexors are intact. She has difficulty on toe walking due to her size but does have intact resistive dorsiflexion plantar flexion of the ankles. These are symmetrical. Bilateral pitting edema. Back pain with palpation over the lumbar spine and sciatic region. Some tenderness of the greater trochanter.  Neurological: She is alert and oriented to person, place, and time.  Skin: Skin is warm and dry.  Psychiatric: She has a normal mood and affect. Her behavior is normal.    Ortho Exam  Specialty Comments:  No specialty comments available.  Imaging: No results found.   PMFS History: Patient Active Problem List   Diagnosis Date Noted  . Morbid (severe) obesity due to excess calories ([place]) 11/25/2016  . Shortness of breath 06/13/2016  . Atypical chest pain 06/13/2016  . Anemia of chronic disease 03/08/2015  . High blood pressure 07/22/2012  . Diabetes mellitus ([place]) 07/22/2012   Past Medical History:  Diagnosis Date  . Anemia   . Atypical chest pain 06/13/2016  . Breast nodule 2009   Right   . Diabetes mellitus   . Fibroid    Symptomatic  . H/O dysmenorrhea   . H/O hematuria   . H/O hypercholesterolemia   . H/O: menorrhagia   . H/O: obesity   . HPV in female   . Hypertension   . Microhematuria 2012  . Shortness of breath 06/13/2016  . Yeast vaginitis 2008    Family History  Problem Relation Age of Onset  . Diabetes Mother   . Valvular heart disease Mother   . Diabetes Brother   . Hypertension Father     Past Surgical History:  Procedure Laterality Date  . ABDOMINAL HYSTERECTOMY    . HYSTEROSCOPY W/D&C  2003  . POLYPECTOMY  2008  . TUBAL LIGATION  1984   Bilateral   Social History   Occupational History  . Not on file.   Social History Main Topics  . Smoking status: Never Smoker  . Smokeless tobacco: Never Used  . Alcohol use No  . Drug use: No  . Sexual activity: No     Comment: hysterectomy

## 2016-11-26 ENCOUNTER — Other Ambulatory Visit: Payer: Commercial Managed Care - HMO

## 2016-11-26 ENCOUNTER — Ambulatory Visit: Payer: Commercial Managed Care - HMO | Admitting: Hematology

## 2016-11-26 NOTE — Progress Notes (Signed)
Cardiology Office Note   Date:  11/27/2016   ID:  JERNEE [name], DOB 03/17/1961, MRN [id number]  PCP:  Marjorie Smolder, MD  Cardiologist:   Skeet Latch, MD   Chief Complaint  Patient presents with  . Follow-up    1 month; Pt states no Sx.      History of Present Illness: Erin [name] is a 56 y.o. female with diabetes, hypertension, hyperlipidemia and morbid obesity who presents for follow up.  She was seen in clinic 06/13/16 for intermittent episodes of shortness of breath and chest discomfort.  The episodes occur randomly and not particularly with exertion. She was referred for an ETT 06/2016 that was negative for ischemia.  She achieved 7.6 METS on a Bruce protocol and was noted to have monomorphic PVCs during recovery.  She had lower extremity edema and was referred for an echo 11/07/16 that revealed LVEF 55-60% and was otherwise unremarkable. At her last appointment she was noted ot have PVCs and was started on metoprolol.  Her last appointment she has been doing well. She notes improvement in her palpitations on metoprolol. She denies chest pain or shortness of breath. Her only complaint today is right shoulder pain that she thinks comes from her work. She works in a [employer] and has a lot of repetitive movements. This was an issue of several years ago and she had an MRI that showed spurs and rotator cuff problems. She plans to follow-up with her PCP. She also does note some lower extremity edema that happened after she's been sitting for several hours. It improves with elevation of her legs. She denies orthopnea or PND. She has not been exercising but has been working on her diet. She does not add any salt to her food.    Past Medical History:  Diagnosis Date  . Anemia   . Atypical chest pain 06/13/2016  . Breast nodule 2009   Right   . Diabetes mellitus   . Fibroid    Symptomatic  . H/O dysmenorrhea   . H/O hematuria   . H/O hypercholesterolemia   . H/O: menorrhagia   .  H/O: obesity   . HPV in female   . Hypertension   . Microhematuria 2012  . Shortness of breath 06/13/2016  . Yeast vaginitis 2008    Past Surgical History:  Procedure Laterality Date  . ABDOMINAL HYSTERECTOMY    . HYSTEROSCOPY W/D&C  2003  . POLYPECTOMY  2008  . TUBAL LIGATION  1984   Bilateral     Current Outpatient Prescriptions  Medication Sig Dispense Refill  . aspirin 81 MG tablet Take 81 mg by mouth daily.    . Cholecalciferol (VITAMIN D-3 PO) Take 1,000 Units by mouth daily.     [name] glipiZIDE (GLUCOTROL XL) 10 MG 24 hr tablet Take 10 mg by mouth daily.  5  . ibuprofen (ADVIL,MOTRIN) 800 MG tablet TAKE 1 TABLET(S) BY MOUTH 2 TIMES A DAY AS NEEDED [PRN]  3  . losartan (COZAAR) 50 MG tablet Take 50 mg by mouth daily.  5  . metFORMIN (GLUCOPHAGE-XR) 500 MG 24 hr tablet Take 1,000 mg by mouth 2 (two) times daily.  5  . metoprolol tartrate (LOPRESSOR) 25 MG tablet Take 1 tablet (25 mg total) by mouth 2 (two) times daily. 180 tablet 1  . Multiple Vitamin (MULTIVITAMIN) capsule Take 1 capsule by mouth daily.    . rosuvastatin (CRESTOR) 10 MG tablet Take 1 tablet (10 mg total) by mouth daily.  90 tablet 3  . traMADol (ULTRAM) 50 MG tablet Take by mouth every 6 (six) hours as needed.    . vitamin C (ASCORBIC ACID) 500 MG tablet Take 500 mg by mouth daily.     No current facility-administered medications for this visit.     Allergies:   Patient has no known allergies.    Social History:  The patient  reports that she has never smoked. She has never used smokeless tobacco. She reports that she does not drink alcohol or use drugs.   Family History:  The patient's family history includes Diabetes in her brother and mother; Hypertension in her father; Valvular heart disease in her mother.    ROS:  Please see the history of present illness.   Otherwise, review of systems are positive for none.   All other systems are reviewed and negative.    PHYSICAL EXAM: VS:  BP 126/62   Pulse  66   Ht 5\' 9"  (1.753 m)   Wt (!) 151.8 kg (334 lb 9.6 oz)   BMI 49.41 kg/m  , BMI Body mass index is 49.41 kg/m. GENERAL:  Well appearing HEENT:  Pupils equal round and reactive, fundi not visualized, oral mucosa unremarkable NECK:  No JVD.  Waveform within normal limits, carotid upstroke brisk and symmetric, no bruits LYMPHATICS:  No cervical adenopathy LUNGS:  Clear to auscultation bilaterally HEART:  RRR.  PMI not displaced or sustained,S1 and S2 within normal limits, no S3, no S4, no clicks, no rubs, no murmurs ABD:  Flat, positive bowel sounds normal in frequency in pitch, no bruits, no rebound, no guarding, no midline pulsatile mass, no hepatomegaly, no splenomegaly EXT:  2 plus pulses throughout, 1+ pitting edema, no cyanosis no clubbing SKIN:  No rashes no nodules NEURO:  Cranial nerves II through XII grossly intact, motor grossly intact throughout PSYCH:  Cognitively intact, oriented to person place and time   EKG:  EKG is not ordered today. 05/16/16: Sinus rhythm. Rate 68 bpm. Voltage.  ETT 06/21/16:  Blood pressure demonstrated a normal response to exercise.  There was no ST segment deviation noted during stress.  No T wave inversion was noted during stress.   Normal ECG stress test. Frequent monomorphic PVCs are seen during recovery  Echo 11/08/15: Study Conclusions  - Left ventricle: The cavity size was mildly dilated. Systolic   function was normal. The estimated ejection fraction was in the   range of 55% to 60%. Wall motion was normal; there were no   regional wall motion abnormalities. Left ventricular diastolic   function parameters were normal. - Aortic valve: Trileaflet; mildly thickened, mildly calcified   leaflets. - Mitral valve: There was trivial regurgitation. - Left atrium: The atrium was mildly dilated. - Pulmonic valve: There was trivial regurgitation. - Pulmonary arteries: Systolic pressure could not be accurately   estimated.  Recent  Labs: 06/13/2016: TSH 5.22   05/16/16: Sodium 138, potassium 3.9, BUN 10, creatinine 0.55 03/20/16: Hemoglobin A1c 7.3% WBC 8.2, hemoglobin 11.2, hematocrit 35.7, platelets 328 Total cholesterol 179, triglycerides 90, HDL 48, LDL 113  10/04/16: Sodium 139, potassium 4.1, BUN 12, creatinine 0.54 Total cholesterol 138, triglycerides 78, HDL 43, LDL 80 A1c 8.0%   Lipid Panel No results found for: CHOL, TRIG, HDL, CHOLHDL, VLDL, LDLCALC, LDLDIRECT    Wt Readings from Last 3 Encounters:  11/27/16 (!) 151.8 kg (334 lb 9.6 oz)  11/22/16 (!) 149.7 kg (330 lb)  10/22/16 (!) 151.3 kg (333 lb 9.6 oz)  ASSESSMENT AND PLAN:  # PVCs:   Resolved on metoprolol.   # Atypical chest pain: Symptoms are atypical and stress test was negative.  Continue metoprolol.  # Hypertension:  BP blood pressure well-controlled on losartan and metoprolol.  # Hyperlipidemia: LDL 80 09/2016. Continue rosuvastatin.  # Morbid obesity: Discussed the importance of regular exercise and making dietary modification.  We discussed meal planning and exercising at least 30-40 minutes most days of the week.  # LE Edema: Due to prolonged sitting and venous insufficiency. We discussed the importance of wearing compression stockings since and walking regularly. I also recommended that she elevate her legs when sitting. On her echocardiogram her IVC was nondilated and she had normal diastolic function.    Current medicines are reviewed at length with the patient today.  The patient does not have concerns regarding medicines.  The following changes have been made:  None  Labs/ tests ordered today include:   No orders of the defined types were placed in this encounter.    Disposition:   FU with Ciera Beckum C. Oval Linsey, MD, [facility] in 1 year.  APP in 6 months.    This note was written with the assistance of speech recognition software.  Please excuse any transcriptional errors.  Signed, Elsworth Ledin C. Oval Linsey, MD, [facility]   11/27/2016 9:40 AM    [place]

## 2016-11-27 ENCOUNTER — Ambulatory Visit (INDEPENDENT_AMBULATORY_CARE_PROVIDER_SITE_OTHER): Payer: Commercial Managed Care - HMO | Admitting: Cardiovascular Disease

## 2016-11-27 ENCOUNTER — Encounter: Payer: Self-pay | Admitting: Cardiovascular Disease

## 2016-11-27 VITALS — BP 126/62 | HR 66 | Ht 69.0 in | Wt 334.6 lb

## 2016-11-27 DIAGNOSIS — I493 Ventricular premature depolarization: Secondary | ICD-10-CM | POA: Diagnosis not present

## 2016-11-27 DIAGNOSIS — R0789 Other chest pain: Secondary | ICD-10-CM

## 2016-11-27 DIAGNOSIS — I119 Hypertensive heart disease without heart failure: Secondary | ICD-10-CM

## 2016-11-27 DIAGNOSIS — R600 Localized edema: Secondary | ICD-10-CM

## 2016-11-27 NOTE — Patient Instructions (Signed)
Your physician recommends that you continue on your current medications as directed. Please refer to the Current Medication list given to you today.  Your physician recommends that you schedule a follow-up appointment in: [place], [place], [place]    Your physician recommends that you schedule a follow-up appointment in: [place] DR. Leon

## 2016-12-02 ENCOUNTER — Other Ambulatory Visit: Payer: Commercial Managed Care - HMO

## 2016-12-02 ENCOUNTER — Ambulatory Visit: Payer: Commercial Managed Care - HMO | Admitting: Hematology

## 2016-12-05 DIAGNOSIS — Z01419 Encounter for gynecological examination (general) (routine) without abnormal findings: Secondary | ICD-10-CM | POA: Diagnosis not present

## 2016-12-05 DIAGNOSIS — E1165 Type 2 diabetes mellitus with hyperglycemia: Secondary | ICD-10-CM | POA: Diagnosis not present

## 2016-12-10 ENCOUNTER — Telehealth: Payer: Self-pay | Admitting: Hematology

## 2016-12-10 NOTE — Telephone Encounter (Signed)
Due to [facility] called patient re coming in later in the afternoon on 4/4. Per patient not able to come later due to work schedule and not able to work out another time this week due to other conflicts. Per patient lab/fu moved to 4/13 at 9:30 am for lab and 10 am for f/u - date/time per  Patient.

## 2016-12-11 ENCOUNTER — Telehealth (INDEPENDENT_AMBULATORY_CARE_PROVIDER_SITE_OTHER): Payer: Self-pay

## 2016-12-11 ENCOUNTER — Other Ambulatory Visit: Payer: Commercial Managed Care - HMO

## 2016-12-11 ENCOUNTER — Ambulatory Visit: Payer: Commercial Managed Care - HMO | Admitting: Hematology

## 2016-12-11 NOTE — Telephone Encounter (Signed)
Received voicemail from wc adj Matt requesting the 11/12/16 office note. Faxed to him @ [phone].

## 2016-12-18 NOTE — Progress Notes (Signed)
[place]  [phone] [phone]  Clinic Follow Up Note   Patient Care Team: Erin Austin, MD as PCP - General (Family Medicine) 12/20/2016  CHIEF COMPLAINTS:  Follow Up anemia   HISTORY OF PRESENTING ILLNESS:  Erin [name] 56 y.o. female is here because of chronic anemia   She has had anemia for more than 10 years, probably 15 years. She used to have heavy menstrual period, and she had hysrectomy 9 years ago, and did not improve her anemia. She has been taking iron pill (1-2 tab daily), which did not help with her anemia much. She used to donate blood, but stopped 15 years ago due to anemia. Her labwork in January 2016 showed hemoglobin 11.4, MCV 81.4, normal WBC and platelet count, serum iron 34, TIBC 281, transferrin saturation 12%. She increased her pill to 2 tablets a day since then, her repeated lab work in May 2016 showed hemoglobin 11.2, hematocrit 34.8, serum) 24, TIBC 279, transferrin saturation 9%.  She denies recent chest pain on exertion, shortness of breath on minimal exertion, pre-syncopal episodes, or palpitations. She had not noticed any recent bleeding such as epistaxis, hematuria or hematochezia She is on aspirin 27m daily. She takes ibuprofen 1-2 times a week for back pain ` Her last EGD, capsule endoscopy and colonoscopy was 8 years ago, which were negative except one colon polyp which was removed.  She had no prior history or diagnosis of cancer. Her age appropriate screening programs are up-to-date. She denies any pica and eats a variety of diet.  She has mild fatigue, expecially in the morning. She works for a [employer] She has chronic back pain, no other complains.  CURRENT THERAPY: observation   INTERIM HISTORY [name] for follow-up. She was last seen by me a year ago. She is doing well overall, denies any pain, GI symptoms, bleeding, or other symptoms. She sees her primary care physician Dr. GInda Merlinfor  diabetes and hypertension.  MEDICAL HISTORY:  Past Medical History:  Diagnosis Date  . Anemia   . Atypical chest pain 06/13/2016  . Breast nodule 2009   Right   . Diabetes mellitus   . Fibroid    Symptomatic  . H/O dysmenorrhea   . H/O hematuria   . H/O hypercholesterolemia   . H/O: menorrhagia   . H/O: obesity   . HPV in female   . Hypertension   . Microhematuria 2012  . Shortness of breath 06/13/2016  . Yeast vaginitis 2008    SURGICAL HISTORY: Past Surgical History:  Procedure Laterality Date  . ABDOMINAL HYSTERECTOMY    . HYSTEROSCOPY W/D&C  2003  . POLYPECTOMY  2008  . TUBAL LIGATION  1984   Bilateral    SOCIAL HISTORY: Social History   Social History  . Marital status: Divorced    Spouse name: N/A  . Number of children: N/A  . Years of education: N/A   Occupational History  . Not on file.   Social History Main Topics  . Smoking status: Never Smoker  . Smokeless tobacco: Never Used  . Alcohol use No  . Drug use: No  . Sexual activity: No     Comment: hysterectomy   Other Topics Concern  . Not on file   Social History Narrative  . No narrative on file    FAMILY HISTORY: Family History  Problem Relation Age of Onset  . Diabetes Mother   . Valvular heart disease Mother   . Diabetes Brother   .  Hypertension Father     ALLERGIES:  has No Known Allergies.  MEDICATIONS:  Current Outpatient Prescriptions  Medication Sig Dispense Refill  . aspirin 81 MG tablet Take 81 mg by mouth daily.    . Cholecalciferol (VITAMIN D-3 PO) Take 1,000 Units by mouth daily.     [name] glipiZIDE (GLUCOTROL XL) 10 MG 24 hr tablet Take 10 mg by mouth daily.  5  . ibuprofen (ADVIL,MOTRIN) 800 MG tablet TAKE 1 TABLET(S) BY MOUTH 2 TIMES A DAY AS NEEDED [PRN]  3  . losartan (COZAAR) 50 MG tablet Take 50 mg by mouth daily.  5  . metFORMIN (GLUCOPHAGE-XR) 500 MG 24 hr tablet Take 1,000 mg by mouth 2 (two) times daily.  5  . metoprolol tartrate (LOPRESSOR) 25 MG tablet Take  1 tablet (25 mg total) by mouth 2 (two) times daily. 180 tablet 1  . Multiple Vitamin (MULTIVITAMIN) capsule Take 1 capsule by mouth daily.    . rosuvastatin (CRESTOR) 10 MG tablet Take 1 tablet (10 mg total) by mouth daily. 90 tablet 3  . traMADol (ULTRAM) 50 MG tablet Take by mouth every 6 (six) hours as needed.    . vitamin C (ASCORBIC ACID) 500 MG tablet Take 500 mg by mouth daily.     No current facility-administered medications for this visit.     REVIEW OF SYSTEMS:   Constitutional: Denies fevers, chills or abnormal night sweats Eyes: Denies blurriness of vision, double vision or watery eyes Ears, nose, mouth, throat, and face: Denies mucositis or sore throat Respiratory: Denies cough, dyspnea or wheezes Cardiovascular: Denies palpitation, chest discomfort or lower extremity swelling Gastrointestinal:  Denies nausea, heartburn or change in bowel habits Skin: Denies abnormal skin rashes Lymphatics: Denies new lymphadenopathy or easy bruising Neurological:Denies numbness, tingling or new weaknesses Behavioral/Psych: Mood is stable, no new changes  All other systems were reviewed with the patient and are negative.  PHYSICAL EXAMINATION: ECOG PERFORMANCE STATUS: 0  Vitals:   12/20/16 1027  BP: (!) 143/54  Pulse: 65  Resp: 18  Temp: 98.3 F (36.8 C)   Filed Weights   12/20/16 1027  Weight: (!) 329 lb 4.8 oz (149.4 kg)    GENERAL:alert, no distress and comfortable SKIN: skin color, texture, turgor are normal, no rashes or significant lesions EYES: normal, conjunctiva are pink and non-injected, sclera clear OROPHARYNX:no exudate, no erythema and lips, buccal mucosa, and tongue normal  NECK: supple, thyroid normal size, non-tender, without nodularity LYMPH:  no palpable lymphadenopathy in the cervical, axillary or inguinal LUNGS: clear to auscultation and percussion with normal breathing effort HEART: regular rate & rhythm and no murmurs and no lower extremity  edema ABDOMEN:abdomen soft, non-tender and normal bowel sounds Musculoskeletal:no cyanosis of digits and no clubbing  PSYCH: alert & oriented x 3 with fluent speech NEURO: no focal motor/sensory deficits  LABORATORY DATA:  I have reviewed the data as listed CBC Latest Ref Rng & Units 12/20/2016 10/24/2015 05/01/2015  WBC 3.9 - 10.3 10e3/uL 7.2 8.3 7.6  Hemoglobin 11.6 - 15.9 g/dL 10.9(L) 11.2(L) 11.5(L)  Hematocrit 34.8 - 46.6 % 34.8 35.5 36.2  Platelets 145 - 400 10e3/uL 317 347 310    CMP Latest Ref Rng & Units 12/20/2016 03/08/2015  Glucose 70 - 140 mg/dl 139 126  BUN 7.0 - 26.0 mg/dL 10.1 8.4  Creatinine 0.6 - 1.1 mg/dL 0.7 0.7  Sodium 136 - 145 mEq/L 141 140  Potassium 3.5 - 5.1 mEq/L 4.1 3.9  CO2 22 - 29 mEq/L 28  27  Calcium 8.4 - 10.4 mg/dL 9.5 9.6  Total Protein 6.4 - 8.3 g/dL 7.0 7.2  Total Bilirubin 0.20 - 1.20 mg/dL 0.49 0.33  Alkaline Phos 40 - 150 U/L 87 93  AST 5 - 34 U/L 15 12  ALT 0 - 55 U/L 13 13     RADIOGRAPHIC STUDIES: I have personally reviewed the radiological images as listed and agreed with the findings in the report. Xr Lumbar Spine 2-3 Views  Result Date: 11/22/2016 AP lateral lumbar spine x-rays are reviewed. This shows 1 mm anterolisthesis at L4-5. Tiny and splayed spurs anteriorly at several levels. She has well maintained disc space height no evidence of fracture. SI joints appear normal. Impression: Lumbar spine x-rays normal other than trace anterolisthesis at L4-5. No significant change in alignment from previous radiographs in previous years.   ASSESSMENT & PLAN:  56 y.o. female, presented with long-standing history of mild anemia  1. Normocytic hypo-productive anemia, likely anemia of chronic disease  -She has a chronic anemia for over 10 years, with hemoglobin in the range of 11-12, low reticulocyte count normal MCV. -Repeat iron studies on 03/08/15 showed elevated ferritin at 422, low serum iron 32, saturation 13%, TIBC 243 which is on the low  limit, and she did not respond much to IV Feraheme. Her sedimentation rate was elevated. This is more consistent with anemia of chronic disease, possible related to her diabetes -We also previously checked a her TSH, folic acid, U23, which were all normal. ANA was negative, SPEP was negative. -Giving the chronic mild anemia, normal WBC and platelet count, I think the possibility of primary bone marrow disease is low, I will hold on bone marrow biopsy at this point. -The patient is not on oral iron since she did not respond much to IV Feraheme. -Repeat study showed elevated ferritin, slightly low serum iron and saturation, similar to her previous iron study 1 year ago. -Her moderate anemia has been stable overall. We'll follow her CBC on yearly bases. She sees her PCP every 6 months with lab also.   2. Diabetes, HTN, obesity -She'll continue follow-up with her primary care physician -I strongly encouraged her to have healthy diet, exercise regularly, and try to lose some weight. Her anemia may improve if her diabetes  Is better controlled.  PLAN -RTC with lab in one year with lab including CBC, CMP, and iron study  - I encouraged her to eat healthy,  Be more physically active and try to lose some weight -I provided a work excuse note for the patient.  All questions were answered. The patient knows to call the clinic with any problems, questions or concerns. I spent 10 minutes counseling the patient face to face. The total time spent in the appointment was 15 minutes and more than 50% was on counseling.     Truitt Merle, MD 12/20/2016 6:09 PM   This document serves as a record of services personally performed by Truitt Merle, MD. It was created on her behalf by Erin [name], a trained medical scribe. The creation of this record is based on the scribe's personal observations and the provider's statements to them. This document has been checked and approved by the attending provider.

## 2016-12-20 ENCOUNTER — Other Ambulatory Visit (HOSPITAL_BASED_OUTPATIENT_CLINIC_OR_DEPARTMENT_OTHER): Payer: Commercial Managed Care - HMO

## 2016-12-20 ENCOUNTER — Telehealth: Payer: Self-pay | Admitting: Hematology

## 2016-12-20 ENCOUNTER — Encounter: Payer: Self-pay | Admitting: Hematology

## 2016-12-20 ENCOUNTER — Ambulatory Visit (HOSPITAL_BASED_OUTPATIENT_CLINIC_OR_DEPARTMENT_OTHER): Payer: Commercial Managed Care - HMO | Admitting: Hematology

## 2016-12-20 VITALS — BP 143/54 | HR 65 | Temp 98.3°F | Resp 18 | Ht 69.0 in | Wt 329.3 lb

## 2016-12-20 DIAGNOSIS — E669 Obesity, unspecified: Secondary | ICD-10-CM

## 2016-12-20 DIAGNOSIS — E119 Type 2 diabetes mellitus without complications: Secondary | ICD-10-CM

## 2016-12-20 DIAGNOSIS — D638 Anemia in other chronic diseases classified elsewhere: Secondary | ICD-10-CM

## 2016-12-20 DIAGNOSIS — D649 Anemia, unspecified: Secondary | ICD-10-CM

## 2016-12-20 DIAGNOSIS — I10 Essential (primary) hypertension: Secondary | ICD-10-CM | POA: Diagnosis not present

## 2016-12-20 LAB — COMPREHENSIVE METABOLIC PANEL
ALT: 13 U/L (ref 0–55)
AST: 15 U/L (ref 5–34)
Albumin: 3.7 g/dL (ref 3.5–5.0)
Alkaline Phosphatase: 87 U/L (ref 40–150)
Anion Gap: 8 mEq/L (ref 3–11)
BILIRUBIN TOTAL: 0.49 mg/dL (ref 0.20–1.20)
BUN: 10.1 mg/dL (ref 7.0–26.0)
CHLORIDE: 105 meq/L (ref 98–109)
CO2: 28 meq/L (ref 22–29)
CREATININE: 0.7 mg/dL (ref 0.6–1.1)
Calcium: 9.5 mg/dL (ref 8.4–10.4)
EGFR: >90 mL/min/{1.73_m2} (ref >90)
GLUCOSE: 139 mg/dL (ref 70–140)
Potassium: 4.1 mEq/L (ref 3.5–5.1)
SODIUM: 141 meq/L (ref 136–145)
TOTAL PROTEIN: 7 g/dL (ref 6.4–8.3)

## 2016-12-20 LAB — IRON AND TIBC
%SAT: 16 % — AB (ref 21–57)
Iron: 38 ug/dL — ABNORMAL LOW (ref 41–142)
TIBC: 244 ug/dL (ref 236–444)
UIBC: 206 ug/dL (ref 120–384)

## 2016-12-20 LAB — CBC & DIFF AND RETIC
BASO%: 0.3 % (ref 0.0–2.0)
Basophils Absolute: 0 10*3/uL (ref 0.0–0.1)
EOS ABS: 0.2 10*3/uL (ref 0.0–0.5)
EOS%: 2.9 % (ref 0.0–7.0)
HCT: 34.8 % (ref 34.8–46.6)
HEMOGLOBIN: 10.9 g/dL — AB (ref 11.6–15.9)
Immature Retic Fract: 10 % (ref 1.60–10.00)
LYMPH%: 27.7 % (ref 14.0–49.7)
MCH: 25.2 pg (ref 25.1–34.0)
MCHC: 31.3 g/dL — ABNORMAL LOW (ref 31.5–36.0)
MCV: 80.6 fL (ref 79.5–101.0)
MONO#: 0.5 10*3/uL (ref 0.1–0.9)
MONO%: 6.3 % (ref 0.0–14.0)
NEUT%: 62.8 % (ref 38.4–76.8)
NEUTROS ABS: 4.5 10*3/uL (ref 1.5–6.5)
Platelets: 317 10*3/uL (ref 145–400)
RBC: 4.32 10*6/uL (ref 3.70–5.45)
RDW: 14.9 % — AB (ref 11.2–14.5)
RETIC %: 1.71 % (ref 0.70–2.10)
Retic Ct Abs: 73.87 10*3/uL (ref 33.70–90.70)
WBC: 7.2 10*3/uL (ref 3.9–10.3)
lymph#: 2 10*3/uL (ref 0.9–3.3)

## 2016-12-20 LAB — FERRITIN: Ferritin: 590 ng/ml — ABNORMAL HIGH (ref 9–269)

## 2016-12-20 NOTE — Telephone Encounter (Signed)
Appointments scheduled per 4.13.18 LOS. Patient given AVS report and calendars with future scheduled appointments. °

## 2017-03-05 DIAGNOSIS — E1165 Type 2 diabetes mellitus with hyperglycemia: Secondary | ICD-10-CM | POA: Diagnosis not present

## 2017-04-17 ENCOUNTER — Other Ambulatory Visit: Payer: Self-pay | Admitting: Cardiovascular Disease

## 2017-04-17 NOTE — Telephone Encounter (Signed)
Please review for refill, thanks ! 

## 2017-04-30 DIAGNOSIS — E1165 Type 2 diabetes mellitus with hyperglycemia: Secondary | ICD-10-CM | POA: Diagnosis not present

## 2017-04-30 DIAGNOSIS — I10 Essential (primary) hypertension: Secondary | ICD-10-CM | POA: Diagnosis not present

## 2017-04-30 DIAGNOSIS — E7800 Pure hypercholesterolemia, unspecified: Secondary | ICD-10-CM | POA: Diagnosis not present

## 2017-05-29 DIAGNOSIS — E7800 Pure hypercholesterolemia, unspecified: Secondary | ICD-10-CM | POA: Diagnosis not present

## 2017-05-29 DIAGNOSIS — Z0000 Encounter for general adult medical examination without abnormal findings: Secondary | ICD-10-CM | POA: Diagnosis not present

## 2017-05-29 DIAGNOSIS — Z23 Encounter for immunization: Secondary | ICD-10-CM | POA: Diagnosis not present

## 2017-06-02 DIAGNOSIS — E119 Type 2 diabetes mellitus without complications: Secondary | ICD-10-CM | POA: Diagnosis not present

## 2017-06-02 DIAGNOSIS — E1165 Type 2 diabetes mellitus with hyperglycemia: Secondary | ICD-10-CM | POA: Diagnosis not present

## 2017-07-14 ENCOUNTER — Other Ambulatory Visit: Payer: Self-pay | Admitting: Cardiovascular Disease

## 2017-07-14 NOTE — Telephone Encounter (Signed)
Please review for refill, thanks ! 

## 2017-09-01 DIAGNOSIS — E119 Type 2 diabetes mellitus without complications: Secondary | ICD-10-CM | POA: Diagnosis not present

## 2017-09-01 DIAGNOSIS — E1165 Type 2 diabetes mellitus with hyperglycemia: Secondary | ICD-10-CM | POA: Diagnosis not present

## 2017-09-11 DIAGNOSIS — E1165 Type 2 diabetes mellitus with hyperglycemia: Secondary | ICD-10-CM | POA: Diagnosis not present

## 2017-09-11 DIAGNOSIS — I10 Essential (primary) hypertension: Secondary | ICD-10-CM | POA: Diagnosis not present

## 2017-09-11 DIAGNOSIS — E7800 Pure hypercholesterolemia, unspecified: Secondary | ICD-10-CM | POA: Diagnosis not present

## 2017-11-06 DIAGNOSIS — Z8371 Family history of colonic polyps: Secondary | ICD-10-CM | POA: Diagnosis not present

## 2017-11-06 DIAGNOSIS — Z1211 Encounter for screening for malignant neoplasm of colon: Secondary | ICD-10-CM | POA: Diagnosis not present

## 2017-12-01 DIAGNOSIS — D127 Benign neoplasm of rectosigmoid junction: Secondary | ICD-10-CM | POA: Diagnosis not present

## 2017-12-01 DIAGNOSIS — K6389 Other specified diseases of intestine: Secondary | ICD-10-CM | POA: Diagnosis not present

## 2017-12-01 DIAGNOSIS — Z1211 Encounter for screening for malignant neoplasm of colon: Secondary | ICD-10-CM | POA: Diagnosis not present

## 2017-12-01 DIAGNOSIS — Z8371 Family history of colonic polyps: Secondary | ICD-10-CM | POA: Diagnosis not present

## 2017-12-01 DIAGNOSIS — K635 Polyp of colon: Secondary | ICD-10-CM | POA: Diagnosis not present

## 2017-12-15 DIAGNOSIS — E1165 Type 2 diabetes mellitus with hyperglycemia: Secondary | ICD-10-CM | POA: Diagnosis not present

## 2017-12-15 DIAGNOSIS — E119 Type 2 diabetes mellitus without complications: Secondary | ICD-10-CM | POA: Diagnosis not present

## 2017-12-18 ENCOUNTER — Telehealth: Payer: Self-pay | Admitting: Hematology

## 2017-12-18 NOTE — Telephone Encounter (Signed)
Patient called to reschedule  °

## 2017-12-19 ENCOUNTER — Other Ambulatory Visit: Payer: Commercial Managed Care - HMO

## 2017-12-19 ENCOUNTER — Ambulatory Visit: Payer: Commercial Managed Care - HMO | Admitting: Hematology

## 2017-12-24 DIAGNOSIS — Z6841 Body Mass Index (BMI) 40.0 and over, adult: Secondary | ICD-10-CM | POA: Diagnosis not present

## 2017-12-24 DIAGNOSIS — Z1231 Encounter for screening mammogram for malignant neoplasm of breast: Secondary | ICD-10-CM | POA: Diagnosis not present

## 2017-12-24 DIAGNOSIS — Z01419 Encounter for gynecological examination (general) (routine) without abnormal findings: Secondary | ICD-10-CM | POA: Diagnosis not present

## 2018-01-12 ENCOUNTER — Other Ambulatory Visit: Payer: Self-pay | Admitting: Cardiovascular Disease

## 2018-01-12 NOTE — Telephone Encounter (Signed)
REFILL 

## 2018-01-13 NOTE — Progress Notes (Signed)
[place]  [phone] Fax:([phone]) [phone]  Clinic Follow Up Note   Patient Care Team: Darcus Austin, MD as PCP - General (Family Medicine) 01/15/2018  CHIEF COMPLAINTS:  Follow Up anemia   HISTORY OF PRESENTING ILLNESS:  Erin [name] 57 y.o. female is here because of chronic anemia   She has had anemia for more than 10 years, probably 15 years. She used to have heavy menstrual period, and she had hysrectomy 9 years ago, and did not improve her anemia. She has been taking iron pill (1-2 tab daily), which did not help with her anemia much. She used to donate blood, but stopped 15 years ago due to anemia. Her labwork in January 2016 showed hemoglobin 11.4, MCV 81.4, normal WBC and platelet count, serum iron 34, TIBC 281, transferrin saturation 12%. She increased her pill to 2 tablets a day since then, her repeated lab work in May 2016 showed hemoglobin 11.2, hematocrit 34.8, serum) 24, TIBC 279, transferrin saturation 9%.  She denies recent chest pain on exertion, shortness of breath on minimal exertion, pre-syncopal episodes, or palpitations. She had not noticed any recent bleeding such as epistaxis, hematuria or hematochezia She is on aspirin 5m daily. She takes ibuprofen 1-2 times a week for back pain ` Her last EGD, capsule endoscopy and colonoscopy was 8 years ago, which were negative except one colon polyp which was removed.  She had no prior history or diagnosis of cancer. Her age appropriate screening programs are up-to-date. She denies any pica and eats a variety of diet.  She has mild fatigue, expecially in the morning. She works for a [employer] She has chronic back pain, no other complains.  CURRENT THERAPY: observation   INTERIM HISTORY [name] for follow-up. She was last seen by me 12/2016. She presents to the office by herself. She notes that she has been doing well overall. She hasn't taken any PO iron supplements recently. .    She has an appointment with her PCP for her DM in a couple of weeks. She notes that her prior A1c was 7.5 and she was informed that there was room to improve, which she is working on.   On review of systems, she denies fatigue and any other symptoms. 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    MEDICAL HISTORY:  Past Medical History:  Diagnosis Date  . Anemia   . Atypical chest pain 06/13/2016  . Breast nodule 2009   Right   . Diabetes mellitus   . Fibroid    Symptomatic  . H/O dysmenorrhea   . H/O hematuria   . H/O hypercholesterolemia   . H/O: menorrhagia   . H/O: obesity   . HPV in female   . Hypertension   . Microhematuria 2012  . Shortness of breath 06/13/2016  . Yeast vaginitis 2008    SURGICAL HISTORY: Past Surgical History:  Procedure Laterality Date  . ABDOMINAL HYSTERECTOMY    . HYSTEROSCOPY W/D&C  2003  . POLYPECTOMY  2008  . TUBAL LIGATION  1984   Bilateral    SOCIAL HISTORY: Social History   Socioeconomic History  . Marital status: Divorced    Spouse name: Not on file  . Number of children: Not on file  . Years of education: Not on file  . Highest education level: Not on file  Occupational History  .  Not on file  Social Needs  . Financial resource strain: Not on file  . Food insecurity:    Worry: Not on file    Inability: Not on file  . Transportation needs:    Medical: Not on file    Non-medical: Not on file  Tobacco Use  . Smoking status: Never Smoker  . Smokeless tobacco: Never Used  Substance and Sexual Activity  . Alcohol use: No  . Drug use: No  . Sexual activity: Never    Comment: hysterectomy  Lifestyle  . Physical activity:    Days per week: Not on file    Minutes per session: Not on file  . Stress: Not on file  Relationships  . Social connections:    Talks on phone: Not on file    Gets together: Not on file    Attends religious service: Not  on file    Active member of club or organization: Not on file    Attends meetings of clubs or organizations: Not on file    Relationship status: Not on file  . Intimate partner violence:    Fear of current or ex partner: Not on file    Emotionally abused: Not on file    Physically abused: Not on file    Forced sexual activity: Not on file  Other Topics Concern  . Not on file  Social History Narrative  . Not on file    FAMILY HISTORY: Family History  Problem Relation Age of Onset  . Diabetes Mother   . Valvular heart disease Mother   . Diabetes Brother   . Hypertension Father     ALLERGIES:  has No Known Allergies.  MEDICATIONS:  Current Outpatient Medications  Medication Sig Dispense Refill  . aspirin 81 MG tablet Take 81 mg by mouth daily.    . calcium acetate (PHOSLO) 667 MG capsule Take 667 mg by mouth 1 day or 1 dose.    . Cholecalciferol (VITAMIN D-3 PO) Take 1,000 Units by mouth daily.     [name] glipiZIDE (GLUCOTROL XL) 10 MG 24 hr tablet Take 10 mg by mouth daily.  5  . ibuprofen (ADVIL,MOTRIN) 800 MG tablet TAKE 1 TABLET(S) BY MOUTH 2 TIMES A DAY AS NEEDED [PRN]  3  . losartan (COZAAR) 50 MG tablet Take 50 mg by mouth daily.  5  . metFORMIN (GLUCOPHAGE-XR) 500 MG 24 hr tablet Take 1,000 mg by mouth 2 (two) times daily.  5  . metoprolol tartrate (LOPRESSOR) 25 MG tablet Take 1 tablet (25 mg total) by mouth 2 (two) times daily. NEED OV. 180 tablet 0  . Multiple Vitamin (MULTIVITAMIN) capsule Take 1 capsule by mouth daily.    . traMADol (ULTRAM) 50 MG tablet Take by mouth every 6 (six) hours as needed.    . vitamin C (ASCORBIC ACID) 500 MG tablet Take 500 mg by mouth daily.    . rosuvastatin (CRESTOR) 10 MG tablet TAKE 1 TABLET (10 MG TOTAL) BY MOUTH DAILY. 90 tablet 3   No current facility-administered medications for this visit.     REVIEW OF SYSTEMS:   Constitutional: Denies fevers, chills or abnormal night sweats Eyes: Denies blurriness of vision, double vision or  watery eyes Ears, nose, mouth, throat, and face: Denies mucositis or sore throat Respiratory: Denies cough, dyspnea or wheezes Cardiovascular: Denies palpitation, chest discomfort or lower extremity swelling Gastrointestinal:  Denies nausea, heartburn or change in bowel habits Skin: Denies abnormal skin rashes Lymphatics: Denies new lymphadenopathy or easy  bruising Neurological:Denies numbness, tingling or new weaknesses Behavioral/Psych: Mood is stable, no new changes  All other systems were reviewed with the patient and are negative.  PHYSICAL EXAMINATION:  ECOG PERFORMANCE STATUS: 0  Vitals:   01/15/18 1012  BP: (!) 131/59  Pulse: 69  Resp: 18  Temp: 98.2 F (36.8 C)  SpO2: 100%   Filed Weights   01/15/18 1012  Weight: (!) 331 lb 11.2 oz (150.5 kg)    GENERAL:alert, no distress and comfortable SKIN: skin color, texture, turgor are normal, no rashes or significant lesions EYES: normal, conjunctiva are pink and non-injected, sclera clear OROPHARYNX:no exudate, no erythema and lips, buccal mucosa, and tongue normal  NECK: supple, thyroid normal size, non-tender, without nodularity LYMPH:  no palpable lymphadenopathy in the cervical, axillary or inguinal LUNGS: clear to auscultation and percussion with normal breathing effort HEART: regular rate & rhythm and no murmurs and no lower extremity edema ABDOMEN:abdomen soft, non-tender and normal bowel sounds Musculoskeletal:no cyanosis of digits and no clubbing  PSYCH: alert & oriented x 3 with fluent speech NEURO: no focal motor/sensory deficits  LABORATORY DATA:  I have reviewed the data as listed CBC Latest Ref Rng & Units 01/15/2018 12/20/2016 10/24/2015  WBC 3.9 - 10.3 K/uL 8.4 7.2 8.3  Hemoglobin 11.6 - 15.9 g/dL 10.8(L) 10.9(L) 11.2(L)  Hematocrit 34.8 - 46.6 % 34.6(L) 34.8 35.5  Platelets 145 - 400 K/uL 312 317 347    CMP Latest Ref Rng & Units 12/20/2016 03/08/2015  Glucose 70 - 140 mg/dl 139 126  BUN 7.0 - 26.0 mg/dL  10.1 8.4  Creatinine 0.6 - 1.1 mg/dL 0.7 0.7  Sodium 136 - 145 mEq/L 141 140  Potassium 3.5 - 5.1 mEq/L 4.1 3.9  CO2 22 - 29 mEq/L 28 27  Calcium 8.4 - 10.4 mg/dL 9.5 9.6  Total Protein 6.4 - 8.3 g/dL 7.0 7.2  Total Bilirubin 0.20 - 1.20 mg/dL 0.49 0.33  Alkaline Phos 40 - 150 U/L 87 93  AST 5 - 34 U/L 15 12  ALT 0 - 55 U/L 13 13     RADIOGRAPHIC STUDIES: I have personally reviewed the radiological images as listed and agreed with the findings in the report. No results found.  ASSESSMENT & PLAN:  57 y.o. female, presented with long-standing history of mild anemia  1. Normocytic hypo-productive anemia, likely anemia of chronic disease, mild iron deficiency  -She has a chronic anemia for over 10 years, with hemoglobin in the range of 11-12, low reticulocyte count normal MCV. -Repeat iron studies on 03/08/15 showed elevated ferritin at 422, low serum iron 32, saturation 13%, TIBC 243 which is on the low limit, and she did not respond much to IV Feraheme. Her sedimentation rate was elevated. This is more consistent with anemia of chronic disease, possible related to her diabetes -We also previously checked a her TSH, folic acid, V37, which were all normal. ANA was negative, SPEP was negative. -Giving the chronic mild anemia, normal WBC and platelet count, I think the possibility of primary bone marrow disease is low, I will hold on bone marrow biopsy at this point. -The patient is not on oral iron since she did not respond much to IV Feraheme. -Repeat study showed elevated ferritin, slightly low serum iron and saturation, similar to her previous iron study 1 year ago. -Hgb today (01/15/2018) at 10.8 and stable. Reticulocytes WNL today.  -I discussed that if the hgb is less than 9, then it is moderate anemia and the patient would  be started on Aranesp injection to stimulate bone marrow to produce more RBCs. This is not needed at this time due to the studies from today.  -Her previous iron  studies showed elevated ferritin, slightly decreased serum iron and transferrin saturation.  I discussed and advised that the patient start taking PO OTC iron supplement (65 mg ferrous sulfate daily) to aid in iron levels. I instructed the patient to start taking this with either orange juice or vitamin C supplement.  -Her anemia has been stable overall. We'll follow her CBC on yearly bases. She sees her PCP every 4-6 months with lab also.  -Lab and f/u in one year   2. Diabetes, HTN, obesity -She'll continue follow-up with her primary care physician -I again encouraged her to have healthy diet, exercise regularly, and try to lose some weight. Her anemia may improve if her diabetes  Is better controlled. -Recent hgb A1 c on 03/20/2016 at 7.3 -I advised the patient to maintain follow up with her PCP regarding these co-morbidities.   PLAN -Lab and f/u in one year  -She will start oral ferrous sulfate once a day  All questions were answered. The patient knows to call the clinic with any problems, questions or concerns. I spent 10 minutes counseling the patient face to face. The total time spent in the appointment was 15 minutes and more than 50% was on counseling.   This document serves as a record of services personally performed by Truitt Merle, MD. It was created on her behalf by Steva Colder, a trained medical scribe. The creation of this record is based on the scribe's personal observations and the provider's statements to them.   I have reviewed the above documentation for accuracy and completeness, and I agree with the above.     Truitt Merle, MD 01/15/2018 10:56 AM

## 2018-01-15 ENCOUNTER — Inpatient Hospital Stay: Payer: 59 | Admitting: Hematology

## 2018-01-15 ENCOUNTER — Inpatient Hospital Stay: Payer: 59 | Attending: Hematology

## 2018-01-15 ENCOUNTER — Encounter: Payer: Self-pay | Admitting: Hematology

## 2018-01-15 ENCOUNTER — Telehealth: Payer: Self-pay | Admitting: Hematology

## 2018-01-15 VITALS — BP 131/59 | HR 69 | Temp 98.2°F | Resp 18 | Ht 69.0 in | Wt 331.7 lb

## 2018-01-15 DIAGNOSIS — N63 Unspecified lump in unspecified breast: Secondary | ICD-10-CM | POA: Diagnosis not present

## 2018-01-15 DIAGNOSIS — Z86010 Personal history of colonic polyps: Secondary | ICD-10-CM

## 2018-01-15 DIAGNOSIS — Z7982 Long term (current) use of aspirin: Secondary | ICD-10-CM

## 2018-01-15 DIAGNOSIS — R700 Elevated erythrocyte sedimentation rate: Secondary | ICD-10-CM | POA: Insufficient documentation

## 2018-01-15 DIAGNOSIS — Z7984 Long term (current) use of oral hypoglycemic drugs: Secondary | ICD-10-CM | POA: Insufficient documentation

## 2018-01-15 DIAGNOSIS — I10 Essential (primary) hypertension: Secondary | ICD-10-CM | POA: Diagnosis not present

## 2018-01-15 DIAGNOSIS — D638 Anemia in other chronic diseases classified elsewhere: Secondary | ICD-10-CM

## 2018-01-15 DIAGNOSIS — M549 Dorsalgia, unspecified: Secondary | ICD-10-CM | POA: Insufficient documentation

## 2018-01-15 DIAGNOSIS — E669 Obesity, unspecified: Secondary | ICD-10-CM | POA: Diagnosis not present

## 2018-01-15 DIAGNOSIS — Z90710 Acquired absence of both cervix and uterus: Secondary | ICD-10-CM | POA: Insufficient documentation

## 2018-01-15 DIAGNOSIS — E119 Type 2 diabetes mellitus without complications: Secondary | ICD-10-CM | POA: Diagnosis not present

## 2018-01-15 DIAGNOSIS — G8929 Other chronic pain: Secondary | ICD-10-CM | POA: Insufficient documentation

## 2018-01-15 DIAGNOSIS — Z79899 Other long term (current) drug therapy: Secondary | ICD-10-CM

## 2018-01-15 DIAGNOSIS — D649 Anemia, unspecified: Secondary | ICD-10-CM

## 2018-01-15 LAB — CBC WITH DIFFERENTIAL (CANCER CENTER ONLY)
BASOS ABS: 0 10*3/uL (ref 0.0–0.1)
Basophils Relative: 0 %
Eosinophils Absolute: 0.3 10*3/uL (ref 0.0–0.5)
Eosinophils Relative: 4 %
HCT: 34.6 % — ABNORMAL LOW (ref 34.8–46.6)
HEMOGLOBIN: 10.8 g/dL — AB (ref 11.6–15.9)
LYMPHS PCT: 28 %
Lymphs Abs: 2.3 10*3/uL (ref 0.9–3.3)
MCH: 25.6 pg (ref 25.1–34.0)
MCHC: 31.2 g/dL — ABNORMAL LOW (ref 31.5–36.0)
MCV: 82 fL (ref 79.5–101.0)
MONO ABS: 0.5 10*3/uL (ref 0.1–0.9)
MONOS PCT: 6 %
NEUTROS ABS: 5.3 10*3/uL (ref 1.5–6.5)
NEUTROS PCT: 62 %
Platelet Count: 312 10*3/uL (ref 145–400)
RBC: 4.22 MIL/uL (ref 3.70–5.45)
RDW: 14.9 % — AB (ref 11.2–14.5)
WBC Count: 8.4 10*3/uL (ref 3.9–10.3)

## 2018-01-15 LAB — RETICULOCYTES
RBC.: 4.22 MIL/uL (ref 3.70–5.45)
RETIC COUNT ABSOLUTE: 67.5 10*3/uL (ref 33.7–90.7)
Retic Ct Pct: 1.6 % (ref 0.7–2.1)

## 2018-01-15 NOTE — Telephone Encounter (Signed)
Scheduled appt per 5/9 los - Gave patient aVS and calender per los.

## 2018-01-21 DIAGNOSIS — I10 Essential (primary) hypertension: Secondary | ICD-10-CM | POA: Diagnosis not present

## 2018-01-21 DIAGNOSIS — E1165 Type 2 diabetes mellitus with hyperglycemia: Secondary | ICD-10-CM | POA: Diagnosis not present

## 2018-01-21 DIAGNOSIS — E7800 Pure hypercholesterolemia, unspecified: Secondary | ICD-10-CM | POA: Diagnosis not present

## 2018-02-13 DIAGNOSIS — L723 Sebaceous cyst: Secondary | ICD-10-CM | POA: Diagnosis not present

## 2018-02-16 DIAGNOSIS — M25571 Pain in right ankle and joints of right foot: Secondary | ICD-10-CM | POA: Diagnosis not present

## 2018-02-16 DIAGNOSIS — E13621 Other specified diabetes mellitus with foot ulcer: Secondary | ICD-10-CM | POA: Diagnosis not present

## 2018-02-16 DIAGNOSIS — E11621 Type 2 diabetes mellitus with foot ulcer: Secondary | ICD-10-CM | POA: Insufficient documentation

## 2018-02-16 DIAGNOSIS — M67969 Unspecified disorder of synovium and tendon, unspecified lower leg: Secondary | ICD-10-CM | POA: Diagnosis not present

## 2018-02-24 DIAGNOSIS — M76821 Posterior tibial tendinitis, right leg: Secondary | ICD-10-CM | POA: Diagnosis not present

## 2018-02-24 DIAGNOSIS — M25571 Pain in right ankle and joints of right foot: Secondary | ICD-10-CM | POA: Diagnosis not present

## 2018-02-26 DIAGNOSIS — M25571 Pain in right ankle and joints of right foot: Secondary | ICD-10-CM | POA: Diagnosis not present

## 2018-02-26 DIAGNOSIS — M76821 Posterior tibial tendinitis, right leg: Secondary | ICD-10-CM | POA: Diagnosis not present

## 2018-03-03 DIAGNOSIS — M25571 Pain in right ankle and joints of right foot: Secondary | ICD-10-CM | POA: Diagnosis not present

## 2018-03-03 DIAGNOSIS — M76821 Posterior tibial tendinitis, right leg: Secondary | ICD-10-CM | POA: Diagnosis not present

## 2018-03-05 DIAGNOSIS — M76821 Posterior tibial tendinitis, right leg: Secondary | ICD-10-CM | POA: Diagnosis not present

## 2018-03-05 DIAGNOSIS — M25571 Pain in right ankle and joints of right foot: Secondary | ICD-10-CM | POA: Diagnosis not present

## 2018-03-06 DIAGNOSIS — E11621 Type 2 diabetes mellitus with foot ulcer: Secondary | ICD-10-CM | POA: Diagnosis not present

## 2018-03-10 DIAGNOSIS — M76821 Posterior tibial tendinitis, right leg: Secondary | ICD-10-CM | POA: Diagnosis not present

## 2018-03-10 DIAGNOSIS — M25571 Pain in right ankle and joints of right foot: Secondary | ICD-10-CM | POA: Diagnosis not present

## 2018-03-11 DIAGNOSIS — M76821 Posterior tibial tendinitis, right leg: Secondary | ICD-10-CM | POA: Diagnosis not present

## 2018-03-11 DIAGNOSIS — M25571 Pain in right ankle and joints of right foot: Secondary | ICD-10-CM | POA: Diagnosis not present

## 2018-03-16 DIAGNOSIS — E11621 Type 2 diabetes mellitus with foot ulcer: Secondary | ICD-10-CM | POA: Diagnosis not present

## 2018-03-17 DIAGNOSIS — M25571 Pain in right ankle and joints of right foot: Secondary | ICD-10-CM | POA: Diagnosis not present

## 2018-03-17 DIAGNOSIS — M76821 Posterior tibial tendinitis, right leg: Secondary | ICD-10-CM | POA: Diagnosis not present

## 2018-03-19 DIAGNOSIS — M76821 Posterior tibial tendinitis, right leg: Secondary | ICD-10-CM | POA: Diagnosis not present

## 2018-03-19 DIAGNOSIS — M25571 Pain in right ankle and joints of right foot: Secondary | ICD-10-CM | POA: Diagnosis not present

## 2018-03-20 DIAGNOSIS — E1165 Type 2 diabetes mellitus with hyperglycemia: Secondary | ICD-10-CM | POA: Diagnosis not present

## 2018-03-20 DIAGNOSIS — E119 Type 2 diabetes mellitus without complications: Secondary | ICD-10-CM | POA: Diagnosis not present

## 2018-03-24 DIAGNOSIS — M25571 Pain in right ankle and joints of right foot: Secondary | ICD-10-CM | POA: Diagnosis not present

## 2018-03-24 DIAGNOSIS — M76821 Posterior tibial tendinitis, right leg: Secondary | ICD-10-CM | POA: Diagnosis not present

## 2018-03-26 DIAGNOSIS — M76821 Posterior tibial tendinitis, right leg: Secondary | ICD-10-CM | POA: Diagnosis not present

## 2018-03-26 DIAGNOSIS — M25571 Pain in right ankle and joints of right foot: Secondary | ICD-10-CM | POA: Diagnosis not present

## 2018-03-31 DIAGNOSIS — M76821 Posterior tibial tendinitis, right leg: Secondary | ICD-10-CM | POA: Diagnosis not present

## 2018-03-31 DIAGNOSIS — M25571 Pain in right ankle and joints of right foot: Secondary | ICD-10-CM | POA: Diagnosis not present

## 2018-04-07 DIAGNOSIS — M76821 Posterior tibial tendinitis, right leg: Secondary | ICD-10-CM | POA: Diagnosis not present

## 2018-04-07 DIAGNOSIS — M25571 Pain in right ankle and joints of right foot: Secondary | ICD-10-CM | POA: Diagnosis not present

## 2018-04-09 ENCOUNTER — Other Ambulatory Visit: Payer: Self-pay | Admitting: Cardiovascular Disease

## 2018-04-09 NOTE — Telephone Encounter (Signed)
Rx sent to pharmacy   

## 2018-04-10 DIAGNOSIS — M25571 Pain in right ankle and joints of right foot: Secondary | ICD-10-CM | POA: Diagnosis not present

## 2018-04-10 DIAGNOSIS — E13621 Other specified diabetes mellitus with foot ulcer: Secondary | ICD-10-CM | POA: Diagnosis not present

## 2018-04-10 DIAGNOSIS — M67961 Unspecified disorder of synovium and tendon, right lower leg: Secondary | ICD-10-CM | POA: Diagnosis not present

## 2018-04-15 DIAGNOSIS — M76821 Posterior tibial tendinitis, right leg: Secondary | ICD-10-CM | POA: Diagnosis not present

## 2018-04-15 DIAGNOSIS — M25571 Pain in right ankle and joints of right foot: Secondary | ICD-10-CM | POA: Diagnosis not present

## 2018-04-21 DIAGNOSIS — M76821 Posterior tibial tendinitis, right leg: Secondary | ICD-10-CM | POA: Diagnosis not present

## 2018-04-21 DIAGNOSIS — M25571 Pain in right ankle and joints of right foot: Secondary | ICD-10-CM | POA: Diagnosis not present

## 2018-04-23 DIAGNOSIS — M76821 Posterior tibial tendinitis, right leg: Secondary | ICD-10-CM | POA: Diagnosis not present

## 2018-04-23 DIAGNOSIS — M25571 Pain in right ankle and joints of right foot: Secondary | ICD-10-CM | POA: Diagnosis not present

## 2018-04-28 DIAGNOSIS — M76821 Posterior tibial tendinitis, right leg: Secondary | ICD-10-CM | POA: Diagnosis not present

## 2018-04-28 DIAGNOSIS — M25571 Pain in right ankle and joints of right foot: Secondary | ICD-10-CM | POA: Diagnosis not present

## 2018-04-30 DIAGNOSIS — M76821 Posterior tibial tendinitis, right leg: Secondary | ICD-10-CM | POA: Diagnosis not present

## 2018-04-30 DIAGNOSIS — M25571 Pain in right ankle and joints of right foot: Secondary | ICD-10-CM | POA: Diagnosis not present

## 2018-05-05 DIAGNOSIS — M76821 Posterior tibial tendinitis, right leg: Secondary | ICD-10-CM | POA: Diagnosis not present

## 2018-05-05 DIAGNOSIS — M25571 Pain in right ankle and joints of right foot: Secondary | ICD-10-CM | POA: Diagnosis not present

## 2018-05-07 DIAGNOSIS — M76821 Posterior tibial tendinitis, right leg: Secondary | ICD-10-CM | POA: Diagnosis not present

## 2018-05-07 DIAGNOSIS — M25571 Pain in right ankle and joints of right foot: Secondary | ICD-10-CM | POA: Diagnosis not present

## 2018-05-08 DIAGNOSIS — M25571 Pain in right ankle and joints of right foot: Secondary | ICD-10-CM | POA: Diagnosis not present

## 2018-05-08 DIAGNOSIS — M67961 Unspecified disorder of synovium and tendon, right lower leg: Secondary | ICD-10-CM | POA: Diagnosis not present

## 2018-05-08 DIAGNOSIS — E13621 Other specified diabetes mellitus with foot ulcer: Secondary | ICD-10-CM | POA: Diagnosis not present

## 2018-05-11 ENCOUNTER — Other Ambulatory Visit: Payer: Self-pay | Admitting: Cardiovascular Disease

## 2018-05-19 DIAGNOSIS — M25571 Pain in right ankle and joints of right foot: Secondary | ICD-10-CM | POA: Diagnosis not present

## 2018-05-25 DIAGNOSIS — M67961 Unspecified disorder of synovium and tendon, right lower leg: Secondary | ICD-10-CM | POA: Diagnosis not present

## 2018-05-28 ENCOUNTER — Ambulatory Visit: Payer: 59 | Admitting: Cardiovascular Disease

## 2018-05-28 ENCOUNTER — Encounter: Payer: Self-pay | Admitting: Cardiovascular Disease

## 2018-05-28 VITALS — BP 131/78 | HR 70 | Ht 69.0 in | Wt 337.2 lb

## 2018-05-28 DIAGNOSIS — I493 Ventricular premature depolarization: Secondary | ICD-10-CM

## 2018-05-28 DIAGNOSIS — R0789 Other chest pain: Secondary | ICD-10-CM | POA: Diagnosis not present

## 2018-05-28 DIAGNOSIS — Z01812 Encounter for preprocedural laboratory examination: Secondary | ICD-10-CM | POA: Diagnosis not present

## 2018-05-28 DIAGNOSIS — I10 Essential (primary) hypertension: Secondary | ICD-10-CM

## 2018-05-28 NOTE — Patient Instructions (Addendum)
Medication Instructions:  Your physician recommends that you continue on your current medications as directed. Please refer to the Current Medication list given to you today.  Labwork: BMET 1 WEEK PRIOR TO CT   Testing/Procedures: Your physician has requested that you have cardiac CT. Cardiac computed tomography (CT) is a painless test that uses an x-ray machine to take clear, detailed pictures of your heart. For further information please visit [facility]. Please follow instruction sheet as given. THE OFFICE WILL CONTACT YOU TO SCHEDULE ONCE YOUR INSURANCE HAS APPROVED   Follow-Up: Your physician wants you to follow-up in: 6 MONTHS  You will receive a reminder letter in the mail two months in advance. If you don't receive a letter, please call our office to schedule the follow-up appointment.   Any Other Special Instructions Will Be Listed Below (If Applicable).  Please arrive at the [facility] main entrance of [facility] at xx:xx AM (30-45 minutes prior to test start time)  [facility] [place], [place] [address] [phone]  Proceed to the [facility] Radiology Department (First Floor).  Please follow these instructions carefully (unless otherwise directed):  Hold all erectile dysfunction medications at least 48 hours prior to test.  On the Night Before the Test: . Drink plenty of water. . Do not consume any caffeinated/decaffeinated beverages or chocolate 12 hours prior to your test. . Do not take any antihistamines 12 hours prior to your test. . If you take Metformin do not take 24 hours prior to test. . If the patient has contrast allergy: ? Patient will need a prescription for Prednisone and very clear instructions (as follows): 1. Prednisone 50 mg - take 13 hours prior to test 2. Take another Prednisone 50 mg 7 hours prior to test 3. Take another Prednisone 50 mg 1 hour prior to test 4. Take Benadryl 50 mg 1 hour prior to  test . Patient must complete all four doses of above prophylactic medications. . Patient will need a ride after test due to Benadryl.  On the Day of the Test: . Drink plenty of water. Do not drink any water within one hour of the test. . Do not eat any food 4 hours prior to the test. . You may take your regular medications prior to the test.  After the Test: . Drink plenty of water. . After receiving IV contrast, you may experience a mild flushed feeling. This is normal. . On occasion, you may experience a mild rash up to 24 hours after the test. This is not dangerous. If this occurs, you can take Benadryl 25 mg and increase your fluid intake. . If you experience trouble breathing, this can be serious. If it is severe call 911 IMMEDIATELY. If it is mild, please call our office. . If you take any of these medications: Glipizide/Metformin, Avandament, Glucavance, please do not take 48 hours after completing test.   Cardiac CT Angiogram A cardiac CT angiogram is a procedure to look at the heart and the area around the heart. It may be done to help find the cause of chest pains or other symptoms of heart disease. During this procedure, a large X-ray machine, called a CT scanner, takes detailed pictures of the heart and the surrounding area after a dye (contrast material) has been injected into blood vessels in the area. The procedure is also sometimes called a coronary CT angiogram, coronary artery scanning, or CTA. A cardiac CT angiogram allows the health care provider  to see how well blood is flowing to and from the heart. The health care provider will be able to see if there are any problems, such as: Blockage or narrowing of the coronary arteries in the heart. Fluid around the heart. Signs of weakness or disease in the muscles, valves, and tissues of the heart.  Tell a health care provider about: Any allergies you have. This is especially important if you have had a previous allergic  reaction to contrast dye. All medicines you are taking, including vitamins, herbs, eye drops, creams, and over-the-counter medicines. Any blood disorders you have. Any surgeries you have had. Any medical conditions you have. Whether you are pregnant or may be pregnant. Any anxiety disorders, chronic pain, or other conditions you have that may increase your stress or prevent you from lying still. What are the risks? Generally, this is a safe procedure. However, problems may occur, including: Bleeding. Infection. Allergic reactions to medicines or dyes. Damage to other structures or organs. Kidney damage from the dye or contrast that is used. Increased risk of cancer from radiation exposure. This risk is low. Talk with your health care provider about: The risks and benefits of testing. How you can receive the lowest dose of radiation.  What happens before the procedure? Wear comfortable clothing and remove any jewelry, glasses, dentures, and hearing aids. Follow instructions from your health care provider about eating and drinking. This may include: For 12 hours before the test - avoid caffeine. This includes tea, coffee, soda, energy drinks, and diet pills. Drink plenty of water or other fluids that do not have caffeine in them. Being well-hydrated can prevent complications. For 4-6 hours before the test - stop eating and drinking. The contrast dye can cause nausea, but this is less likely if your stomach is empty. Ask your health care provider about changing or stopping your regular medicines. This is especially important if you are taking diabetes medicines, blood thinners, or medicines to treat erectile dysfunction. What happens during the procedure? Hair on your chest may need to be removed so that small sticky patches called electrodes can be placed on your chest. These will transmit information that helps to monitor your heart during the test. An IV tube will be inserted into one of  your veins. You might be given a medicine to control your heart rate during the test. This will help to ensure that good images are obtained. You will be asked to lie on an exam table. This table will slide in and out of the CT machine during the procedure. Contrast dye will be injected into the IV tube. You might feel warm, or you may get a metallic taste in your mouth. You will be given a medicine (nitroglycerin) to relax (dilate) the arteries in your heart. The table that you are lying on will move into the CT machine tunnel for the scan. The person running the machine will give you instructions while the scans are being done. You may be asked to: Keep your arms above your head. Hold your breath. Stay very still, even if the table is moving. When the scanning is complete, you will be moved out of the machine. The IV tube will be removed. The procedure may vary among health care providers and hospitals. What happens after the procedure? You might feel warm, or you may get a metallic taste in your mouth from the contrast dye. You may have a headache from the nitroglycerin. After the procedure, drink water or other  fluids to wash (flush) the contrast material out of your body. Contact a health care provider if you have any symptoms of allergy to the contrast. These symptoms include: Shortness of breath. Rash or hives. A racing heartbeat. Most people can return to their normal activities right after the procedure. Ask your health care provider what activities are safe for you. It is up to you to get the results of your procedure. Ask your health care provider, or the department that is doing the procedure, when your results will be ready. Summary A cardiac CT angiogram is a procedure to look at the heart and the area around the heart. It may be done to help find the cause of chest pains or other symptoms of heart disease. During this procedure, a large X-ray machine, called a CT scanner,  takes detailed pictures of the heart and the surrounding area after a dye (contrast material) has been injected into blood vessels in the area. Ask your health care provider about changing or stopping your regular medicines before the procedure. This is especially important if you are taking diabetes medicines, blood thinners, or medicines to treat erectile dysfunction. After the procedure, drink water or other fluids to wash (flush) the contrast material out of your body. This information is not intended to replace advice given to you by your health care provider. Make sure you discuss any questions you have with your health care provider. Document Released: 08/08/2008 Document Revised: 07/15/2016 Document Reviewed: 07/15/2016 Elsevier Interactive Patient Education  2017 [employer].

## 2018-05-28 NOTE — Progress Notes (Signed)
Cardiology Office Note   Date:  05/28/2018   ID:  Erin [name], DOB 05-19-1961, MRN [id number]  PCP:  Darcus Austin, MD  Cardiologist:   Skeet Latch, MD   Chief Complaint  Patient presents with  . Follow-up     History of Present Illness: Erin [name] is a 57 y.o. female with diabetes, hypertension, hyperlipidemia and morbid obesity who presents for follow up.  She was seen in clinic 06/13/16 for intermittent episodes of shortness of breath and chest discomfort.  The episodes occur randomly and not particularly with exertion. She was referred for an ETT 06/2016 that was negative for ischemia.  She achieved 7.6 METS on a Bruce protocol and was noted to have monomorphic PVCs during recovery.  She had lower extremity edema and was referred for an echo 11/07/16 that revealed LVEF 55-60% and was otherwise unremarkable. At her last appointment she was noted ot have PVCs and was started on metoprolol.  Her last appointment she has been doing well. She notes improvement in her palpitations on metoprolol. She denies chest pain or shortness of breath. Her only complaint today is right shoulder pain that she thinks comes from her work. She works in a [employer] and has a lot of repetitive movements. This was an issue of several years ago and she had an MRI that showed spurs and rotator cuff problems. She plans to follow-up with her PCP. She also does note some lower extremity edema that happened after she's been sitting for several hours. It improves with elevation of her legs. She denies orthopnea or PND. She has not been exercising but has been working on her diet. She does not add any salt to her food.  At her last appointment Erin [name] reported lower extremity edema that was thought to be due to venous stasis and prolonged sitting.  She was encouraged to wear compression stockings and exercise.  Since her last appointment Erin [name] is been doing fairly well.  Her main complaint is pain in her right foot  that is attributable to osteoarthritis.  They have recommended that she has surgery but she is not interested in doing so.  She continues to have some swelling in the right more than the left foot.  She has no orthopnea or PND.  She has noted over the last several months that when she exerts herself to do housework such as vacuuming or sometimes even washing dishes she gets pressure in her chest.  There is mild associated shortness of breath but no nausea or diaphoresis.  She feels as though she has to take deep breaths.  Second take up to an hour for the symptoms to subside.  She never has it at rest.  Of note, she is not very physically active.  She has been out of work for the last 4 months due to her foot pain.  She is getting ready to go back to work next month.  She does not get any formal exercise.  She reports that she does not add much salt to her food, but she does eat out frequently.   Past Medical History:  Diagnosis Date  . Anemia   . Atypical chest pain 06/13/2016  . Breast nodule 2009   Right   . Diabetes mellitus   . Fibroid    Symptomatic  . H/O dysmenorrhea   . H/O hematuria   . H/O hypercholesterolemia   . H/O: menorrhagia   . H/O: obesity   . HPV  in female   . Hypertension   . Microhematuria 2012  . Shortness of breath 06/13/2016  . Yeast vaginitis 2008    Past Surgical History:  Procedure Laterality Date  . ABDOMINAL HYSTERECTOMY    . HYSTEROSCOPY W/D&C  2003  . POLYPECTOMY  2008  . TUBAL LIGATION  1984   Bilateral     Current Outpatient Medications  Medication Sig Dispense Refill  . aspirin 81 MG tablet Take 81 mg by mouth daily.    . calcium acetate (PHOSLO) 667 MG capsule Take 667 mg by mouth 1 day or 1 dose.    . Cholecalciferol (VITAMIN D-3 PO) Take 1,000 Units by mouth daily.     [name] glipiZIDE (GLUCOTROL XL) 10 MG 24 hr tablet Take 10 mg by mouth daily.  5  . ibuprofen (ADVIL,MOTRIN) 800 MG tablet TAKE 1 TABLET(S) BY MOUTH 2 TIMES A DAY AS NEEDED [PRN]   3  . losartan (COZAAR) 50 MG tablet Take 50 mg by mouth daily.  5  . metFORMIN (GLUCOPHAGE-XR) 500 MG 24 hr tablet Take 1,000 mg by mouth 2 (two) times daily.  5  . metoprolol tartrate (LOPRESSOR) 25 MG tablet TAKE 1 TABLET BY MOUTH TWICE A DAY NEEDS OFFICE VISIT 60 tablet 0  . Multiple Vitamin (MULTIVITAMIN) capsule Take 1 capsule by mouth daily.    . rosuvastatin (CRESTOR) 10 MG tablet TAKE 1 TABLET (10 MG TOTAL) BY MOUTH DAILY. 90 tablet 3  . traMADol (ULTRAM) 50 MG tablet Take by mouth every 6 (six) hours as needed.    . vitamin C (ASCORBIC ACID) 500 MG tablet Take 500 mg by mouth daily.     No current facility-administered medications for this visit.     Allergies:   Patient has no known allergies.    Social History:  The patient  reports that she has never smoked. She has never used smokeless tobacco. She reports that she does not drink alcohol or use drugs.   Family History:  The patient's family history includes Diabetes in her brother and mother; Hypertension in her father; Valvular heart disease in her mother.    ROS:  Please see the history of present illness.   Otherwise, review of systems are positive for none.   All other systems are reviewed and negative.    PHYSICAL EXAM: VS:  BP 131/78   Pulse 70   Ht 5\' 9"  (1.753 m)   Wt (!) 337 lb 3.2 oz (153 kg)   BMI 49.80 kg/m  , BMI Body mass index is 49.8 kg/m. GENERAL:  Well appearing HEENT: Pupils equal round and reactive, fundi not visualized, oral mucosa unremarkable NECK:  No jugular venous distention, waveform within normal limits, carotid upstroke brisk and symmetric, no bruits, no thyromegaly LYMPHATICS:  No cervical adenopathy LUNGS:  Clear to auscultation bilaterally HEART:  RRR.  PMI not displaced or sustained,S1 and S2 within normal limits, no S3, no S4, no clicks, no rubs, II/VI systolic murmur at the LUSB ABD:  Flat, positive bowel sounds normal in frequency in pitch, no bruits, no rebound, no guarding, no  midline pulsatile mass, no hepatomegaly, no splenomegaly EXT:  2 plus pulses throughout, no edema, no cyanosis no clubbing SKIN:  No rashes no nodules NEURO:  Cranial nerves II through XII grossly intact, motor grossly intact throughout PSYCH:  Cognitively intact, oriented to person place and time  EKG:  EKG is ordered today. 05/16/16: Sinus rhythm. Rate 68 bpm. Voltage. 05/28/18:: Sinus rhythm.  Rate 70 bpm.  Low voltage.    ETT 06/21/16:  Blood pressure demonstrated a normal response to exercise.  There was no ST segment deviation noted during stress.  No T wave inversion was noted during stress.   Normal ECG stress test. Frequent monomorphic PVCs are seen during recovery  Echo 11/08/15: Study Conclusions  - Left ventricle: The cavity size was mildly dilated. Systolic   function was normal. The estimated ejection fraction was in the   range of 55% to 60%. Wall motion was normal; there were no   regional wall motion abnormalities. Left ventricular diastolic   function parameters were normal. - Aortic valve: Trileaflet; mildly thickened, mildly calcified   leaflets. - Mitral valve: There was trivial regurgitation. - Left atrium: The atrium was mildly dilated. - Pulmonic valve: There was trivial regurgitation. - Pulmonary arteries: Systolic pressure could not be accurately   estimated.  Recent Labs: 01/15/2018: Hemoglobin 10.8; Platelet Count 312   05/16/16: Sodium 138, potassium 3.9, BUN 10, creatinine 0.55 03/20/16: Hemoglobin A1c 7.3% WBC 8.2, hemoglobin 11.2, hematocrit 35.7, platelets 328 Total cholesterol 179, triglycerides 90, HDL 48, LDL 113  10/04/16: Sodium 139, potassium 4.1, BUN 12, creatinine 0.54 Total cholesterol 138, triglycerides 78, HDL 43, LDL 80 A1c 8.0%  05/29/2017: Total cholesterol 148, triglycerides 88, HDL 46, LDL 85.   Lipid Panel No results found for: CHOL, TRIG, HDL, CHOLHDL, VLDL, LDLCALC, LDLDIRECT    Wt Readings from Last 3 Encounters:    05/28/18 (!) 337 lb 3.2 oz (153 kg)  01/15/18 (!) 331 lb 11.2 oz (150.5 kg)  12/20/16 (!) 329 lb 4.8 oz (149.4 kg)      ASSESSMENT AND PLAN:  # PVCs:   Resolved on metoprolol.    # Atypical chest pain: Symptoms are atypical and stress test was negative. However they are worse lately and exertional.  We will get a coronary CT-a to get a more definitive assessment of her coronaries.  # Hypertension:  BP blood pressure well-controlled on losartan and metoprolol.  No changes.  # Hyperlipidemia: LDL was 88 on 05/2017.  Continue rosuvastatin.  If she is found to have CAD on her cardiac CT-a then her LDL goal will be reduced to 70.  # Morbid obesity: Discussed the importance of regular exercise and making dietary modification.  We again did discuss the importance of increasing her exercise.  If her CT does not show obstructive disease then she will be cleared to start back increasing her exercise.  # LE Edema: Due to prolonged sitting and venous insufficiency.  Recommend compression stockings.      Current medicines are reviewed at length with the patient today.  The patient does not have concerns regarding medicines.  The following changes have been made:  None  Labs/ tests ordered today include:   Orders Placed This Encounter  Procedures  . CT CORONARY MORPH W/CTA COR W/SCORE W/CA W/CM &/OR WO/CM  . CT CORONARY FRACTIONAL FLOW RESERVE DATA PREP  . CT CORONARY FRACTIONAL FLOW RESERVE FLUID ANALYSIS  . Basic metabolic panel  . EKG 12-Lead     Disposition:   FU with Erin Mone C. Oval Linsey, MD, [facility] in 6 months.     Signed, Erin Lorio C. Oval Linsey, MD, [facility]  05/28/2018 1:09 PM    [place]

## 2018-06-03 DIAGNOSIS — E7800 Pure hypercholesterolemia, unspecified: Secondary | ICD-10-CM | POA: Diagnosis not present

## 2018-06-03 DIAGNOSIS — E1165 Type 2 diabetes mellitus with hyperglycemia: Secondary | ICD-10-CM | POA: Diagnosis not present

## 2018-06-03 DIAGNOSIS — Z0000 Encounter for general adult medical examination without abnormal findings: Secondary | ICD-10-CM | POA: Diagnosis not present

## 2018-06-03 DIAGNOSIS — I10 Essential (primary) hypertension: Secondary | ICD-10-CM | POA: Diagnosis not present

## 2018-06-03 DIAGNOSIS — E559 Vitamin D deficiency, unspecified: Secondary | ICD-10-CM | POA: Diagnosis not present

## 2018-06-11 ENCOUNTER — Other Ambulatory Visit: Payer: Self-pay | Admitting: Cardiovascular Disease

## 2018-06-23 DIAGNOSIS — H01021 Squamous blepharitis right upper eyelid: Secondary | ICD-10-CM | POA: Diagnosis not present

## 2018-06-23 DIAGNOSIS — E119 Type 2 diabetes mellitus without complications: Secondary | ICD-10-CM | POA: Diagnosis not present

## 2018-06-23 DIAGNOSIS — H04123 Dry eye syndrome of bilateral lacrimal glands: Secondary | ICD-10-CM | POA: Diagnosis not present

## 2018-06-24 DIAGNOSIS — E1165 Type 2 diabetes mellitus with hyperglycemia: Secondary | ICD-10-CM | POA: Diagnosis not present

## 2018-07-07 ENCOUNTER — Encounter: Payer: Self-pay | Admitting: Cardiovascular Disease

## 2018-07-07 ENCOUNTER — Telehealth: Payer: Self-pay | Admitting: *Deleted

## 2018-07-07 NOTE — Telephone Encounter (Signed)
Erin Lincoln, Erin [name]        Erin [name]   We have tried to contact this patient 3 times, we are mailing a 7-10 day letter, if no response by November 8th, 2019, we will cancel order .    Thank you  Romie Minus    Will route to Dr Oval Linsey so she will be aware

## 2018-07-08 ENCOUNTER — Encounter (INDEPENDENT_AMBULATORY_CARE_PROVIDER_SITE_OTHER): Payer: Self-pay

## 2018-07-11 ENCOUNTER — Other Ambulatory Visit: Payer: Self-pay | Admitting: Cardiovascular Disease

## 2018-07-13 DIAGNOSIS — E119 Type 2 diabetes mellitus without complications: Secondary | ICD-10-CM | POA: Diagnosis not present

## 2018-07-13 DIAGNOSIS — E1165 Type 2 diabetes mellitus with hyperglycemia: Secondary | ICD-10-CM | POA: Diagnosis not present

## 2018-07-13 NOTE — Telephone Encounter (Signed)
Rx(s) sent to pharmacy electronically.  

## 2018-07-15 DIAGNOSIS — R0789 Other chest pain: Secondary | ICD-10-CM | POA: Diagnosis not present

## 2018-07-15 DIAGNOSIS — Z01812 Encounter for preprocedural laboratory examination: Secondary | ICD-10-CM | POA: Diagnosis not present

## 2018-07-15 LAB — BASIC METABOLIC PANEL
BUN / CREAT RATIO: 18 (ref 9–23)
BUN: 9 mg/dL (ref 6–24)
CHLORIDE: 101 mmol/L (ref 96–106)
CO2: 26 mmol/L (ref 20–29)
CREATININE: 0.49 mg/dL — AB (ref 0.57–1.00)
Calcium: 9.7 mg/dL (ref 8.7–10.2)
GFR calc Af Amer: 125 mL/min/{1.73_m2} (ref >59)
GFR calc non Af Amer: 109 mL/min/{1.73_m2} (ref >59)
GLUCOSE: 145 mg/dL — AB (ref 65–99)
POTASSIUM: 4.4 mmol/L (ref 3.5–5.2)
SODIUM: 141 mmol/L (ref 134–144)

## 2018-07-16 ENCOUNTER — Encounter (INDEPENDENT_AMBULATORY_CARE_PROVIDER_SITE_OTHER): Payer: Self-pay | Admitting: Family Medicine

## 2018-07-16 ENCOUNTER — Ambulatory Visit (INDEPENDENT_AMBULATORY_CARE_PROVIDER_SITE_OTHER): Payer: 59 | Admitting: Family Medicine

## 2018-07-16 VITALS — BP 141/79 | HR 64 | Temp 98.1°F | Ht 68.0 in | Wt 330.0 lb

## 2018-07-16 DIAGNOSIS — E119 Type 2 diabetes mellitus without complications: Secondary | ICD-10-CM | POA: Diagnosis not present

## 2018-07-16 DIAGNOSIS — Z9189 Other specified personal risk factors, not elsewhere classified: Secondary | ICD-10-CM | POA: Diagnosis not present

## 2018-07-16 DIAGNOSIS — Z1331 Encounter for screening for depression: Secondary | ICD-10-CM

## 2018-07-16 DIAGNOSIS — Z794 Long term (current) use of insulin: Secondary | ICD-10-CM

## 2018-07-16 DIAGNOSIS — Z0289 Encounter for other administrative examinations: Secondary | ICD-10-CM

## 2018-07-16 DIAGNOSIS — R5383 Other fatigue: Secondary | ICD-10-CM

## 2018-07-16 DIAGNOSIS — R0602 Shortness of breath: Secondary | ICD-10-CM

## 2018-07-16 DIAGNOSIS — I10 Essential (primary) hypertension: Secondary | ICD-10-CM

## 2018-07-16 DIAGNOSIS — Z6841 Body Mass Index (BMI) 40.0 and over, adult: Secondary | ICD-10-CM

## 2018-07-16 DIAGNOSIS — E559 Vitamin D deficiency, unspecified: Secondary | ICD-10-CM

## 2018-07-17 LAB — CBC WITH DIFFERENTIAL
BASOS: 1 %
Basophils Absolute: 0 10*3/uL (ref 0.0–0.2)
EOS (ABSOLUTE): 0.2 10*3/uL (ref 0.0–0.4)
EOS: 3 %
HEMATOCRIT: 35.5 % (ref 34.0–46.6)
HEMOGLOBIN: 11.1 g/dL (ref 11.1–15.9)
Immature Grans (Abs): 0 10*3/uL (ref 0.0–0.1)
Immature Granulocytes: 0 %
Lymphocytes Absolute: 2.2 10*3/uL (ref 0.7–3.1)
Lymphs: 30 %
MCH: 24.6 pg — ABNORMAL LOW (ref 26.6–33.0)
MCHC: 31.3 g/dL — ABNORMAL LOW (ref 31.5–35.7)
MCV: 79 fL (ref 79–97)
MONOCYTES: 7 %
MONOS ABS: 0.5 10*3/uL (ref 0.1–0.9)
Neutrophils Absolute: 4.5 10*3/uL (ref 1.4–7.0)
Neutrophils: 59 %
RBC: 4.52 x10E6/uL (ref 3.77–5.28)
RDW: 14.1 % (ref 12.3–15.4)
WBC: 7.4 10*3/uL (ref 3.4–10.8)

## 2018-07-17 LAB — LIPID PANEL WITH LDL/HDL RATIO
Cholesterol, Total: 151 mg/dL (ref 100–199)
HDL: 50 mg/dL (ref >39)
LDL Calculated: 79 mg/dL (ref 0–99)
LDl/HDL Ratio: 1.6 ratio (ref 0.0–3.2)
TRIGLYCERIDES: 110 mg/dL (ref 0–149)
VLDL CHOLESTEROL CAL: 22 mg/dL (ref 5–40)

## 2018-07-17 LAB — VITAMIN B12: VITAMIN B 12: 217 pg/mL — AB (ref 232–1245)

## 2018-07-17 LAB — HEMOGLOBIN A1C
ESTIMATED AVERAGE GLUCOSE: 200 mg/dL
Hgb A1c MFr Bld: 8.6 % — ABNORMAL HIGH (ref 4.8–5.6)

## 2018-07-17 LAB — T4, FREE: FREE T4: 1.08 ng/dL (ref 0.82–1.77)

## 2018-07-17 LAB — INSULIN, RANDOM: INSULIN: 24.2 u[IU]/mL (ref 2.6–24.9)

## 2018-07-17 LAB — TSH: TSH: 3.69 u[IU]/mL (ref 0.450–4.500)

## 2018-07-17 LAB — T3: T3 TOTAL: 140 ng/dL (ref 71–180)

## 2018-07-17 LAB — VITAMIN D 25 HYDROXY (VIT D DEFICIENCY, FRACTURES): Vit D, 25-Hydroxy: 32.5 ng/mL (ref 30.0–100.0)

## 2018-07-17 LAB — FOLATE: Folate: >20 ng/mL (ref >3.0)

## 2018-07-20 ENCOUNTER — Telehealth: Payer: Self-pay | Admitting: *Deleted

## 2018-07-20 NOTE — Telephone Encounter (Signed)
Patient returned call

## 2018-07-20 NOTE — Progress Notes (Signed)
.  Office: [phone]  /  Fax: [phone]   HPI:   Chief Complaint: OBESITY  Erin [name] (MR# [id number]) is a 57 y.o. female who presents on 07/20/2018 for obesity evaluation and treatment. Current BMI is Body mass index is 50.18 kg/m.[name] Kinga has struggled with obesity for years and has been unsuccessful in either losing weight or maintaining long term weight loss. Mckinzie was given information about our clinic  By her hairdresser. Alison works from 4:00 PM to 12:00 AM. Ivin Booty attended our information session and states she is currently in the action stage of change and ready to dedicate time achieving and maintaining a healthier weight.  Yoceline states her family eats meals together she thinks her family will eat healthier with  her her desired weight loss is 142 lbs she started gaining weight about 15 yrs ago her heaviest weight ever was 342 lbs. she has significant food cravings issues  she snacks frequently in the evenings she skips meals frequently she is frequently drinking liquids with calories she frequently makes poor food choices she frequently eats larger portions than normal  she has binge eating behaviors she struggles with emotional eating    Fatigue Allyson feels her energy is lower than it should be. This has worsened with weight gain and has not worsened recently. Azariya admits to daytime somnolence. Patient is at risk for obstructive sleep apnea. Patent has a history of symptoms of daytime fatigue and hypertension. Patient generally gets 5 hours of sleep per night, and states they generally have restful sleep. Snoring is present. Apneic episodes are present. Epworth Sleepiness Score is 8  EKG was done on 05/28/18 by Dr. Yvetta Coder.  Dyspnea on exertion Ivin Booty notes increasing shortness of breath with exercising and seems to be worsening over time with weight gain. She notes getting out of breath sooner with activity than she used to. This has not gotten worse recently.  EKG was done on 05/28/18 by Dr. Yvetta Coder. Legend denies orthopnea.  Diabetes II Jordyan has a diagnosis of diabetes type II and she has had this diagnosis for at least six years. Tinia reports that her PCP wanted to start her on Ozempic. Aarionna does not have her blood sugar log today. Last A1c was at 8.4 (in August). She has been working on intensive lifestyle modifications including diet, exercise, and weight loss to help control her blood glucose levels.  Hypertension PAISLY FINGERHUT is a 57 y.o. female with hypertension. Her blood pressure is slightly elevated today. She takes her blood pressure medications at night. Sharai sees cardiology. ELENI FRANK denies chest pain. She is working weight loss to help control her blood pressure with the goal of decreasing her risk of heart attack and stroke. Sharons blood pressure is currently controlled.  At risk for cardiovascular disease Leelah is at a higher than average risk for cardiovascular disease due to obesity, diabetes and hypertension. She currently denies any chest pain.  Vitamin D deficiency Lyndel has a diagnosis of vitamin D deficiency. She is currently taking OTC vit D. Abby admits to fatigue and denies nausea, vomiting or muscle weakness.  Depression Screen Adaja's Food and Mood (modified PHQ-9) score was  Depression screen PHQ 2/9 07/16/2018  Decreased Interest 3  Down, Depressed, Hopeless 1  PHQ - 2 Score 4  Altered sleeping 0  Tired, decreased energy 3  Change in appetite 0  Feeling bad or failure about yourself  0  Trouble concentrating 0  Moving slowly or fidgety/restless 1  Suicidal thoughts 0  PHQ-9 Score 8  Difficult doing work/chores Not difficult at all    ALLERGIES: No Known Allergies  MEDICATIONS: Current Outpatient Medications on File Prior to Visit  Medication Sig Dispense Refill  . aspirin 81 MG tablet Take 81 mg by mouth daily.    . calcium acetate (PHOSLO) 667 MG capsule Take 667 mg by mouth 1 day or 1  dose.    . Cholecalciferol (VITAMIN D-3 PO) Take 1,000 Units by mouth daily.     [name] glipiZIDE (GLUCOTROL XL) 10 MG 24 hr tablet Take 10 mg by mouth daily.  5  . ibuprofen (ADVIL,MOTRIN) 800 MG tablet TAKE 1 TABLET(S) BY MOUTH 2 TIMES A DAY AS NEEDED [PRN]  3  . losartan (COZAAR) 50 MG tablet Take 50 mg by mouth daily.  5  . metFORMIN (GLUCOPHAGE-XR) 500 MG 24 hr tablet Take 1,000 mg by mouth 2 (two) times daily.  5  . metoprolol tartrate (LOPRESSOR) 25 MG tablet Take 1 tablet (25 mg total) by mouth 2 (two) times daily. 60 tablet 11  . Multiple Vitamin (MULTIVITAMIN) capsule Take 1 capsule by mouth daily.    . rosuvastatin (CRESTOR) 10 MG tablet Take 1 tablet (10 mg total) by mouth daily. 90 tablet 3  . vitamin C (ASCORBIC ACID) 500 MG tablet Take 500 mg by mouth daily.     No current facility-administered medications on file prior to visit.     PAST MEDICAL HISTORY: Past Medical History:  Diagnosis Date  . Anemia   . Atypical chest pain 06/13/2016  . Back pain   . Breast nodule 2009   Right   . Diabetes mellitus   . Fibroid    Symptomatic  . H/O dysmenorrhea   . H/O hematuria   . H/O hypercholesterolemia   . H/O: menorrhagia   . H/O: obesity   . HPV in female   . Hypertension   . Microhematuria 2012  . Shortness of breath 06/13/2016  . Yeast vaginitis 2008    PAST SURGICAL HISTORY: Past Surgical History:  Procedure Laterality Date  . ABDOMINAL HYSTERECTOMY    . HYSTEROSCOPY W/D&C  2003  . POLYPECTOMY  2008  . TUBAL LIGATION  1984   Bilateral    SOCIAL HISTORY: Social History   Tobacco Use  . Smoking status: Never Smoker  . Smokeless tobacco: Never Used  Substance Use Topics  . Alcohol use: No  . Drug use: No    FAMILY HISTORY: Family History  Problem Relation Age of Onset  . Diabetes Mother   . Valvular heart disease Mother   . Diabetes Brother   . Hypertension Father   . Stroke Father     ROS: Review of Systems  Constitutional: Positive for  malaise/fatigue.  Respiratory: Positive for shortness of breath (on exertion).   Cardiovascular: Negative for chest pain and orthopnea.       + Chest Tightness  Gastrointestinal: Negative for nausea and vomiting.  Musculoskeletal:       Negative for muscle weakness    PHYSICAL EXAM: Blood pressure (!) 141/79, pulse 64, temperature 98.1 F (36.7 C), temperature source Oral, height 5\' 8"  (1.727 m), weight (!) 330 lb (149.7 kg), SpO2 100 %. Body mass index is 50.18 kg/m. Physical Exam  Constitutional: She is oriented to person, place, and time. She appears well-developed and well-nourished.  HENT:  Head: Normocephalic and atraumatic.  Nose: Nose normal.  Eyes: EOM are normal. No scleral icterus.  Neck: Normal range of motion.  Neck supple. No thyromegaly present.  Cardiovascular: Normal rate and regular rhythm.  Pulmonary/Chest: Effort normal. No respiratory distress.  Abdominal: Soft. There is no tenderness.  + Obesity  Musculoskeletal: Normal range of motion.  Range of Motion normal in all 4 extremities  Neurological: She is alert and oriented to person, place, and time. Coordination normal.  Skin: Skin is warm and dry.  Psychiatric: She has a normal mood and affect.  Vitals reviewed.   RECENT LABS AND TESTS: BMET    Component Value Date/Time   NA 141 07/15/2018 1022   NA 141 12/20/2016 1003   K 4.4 07/15/2018 1022   K 4.1 12/20/2016 1003   CL 101 07/15/2018 1022   CO2 26 07/15/2018 1022   CO2 28 12/20/2016 1003   GLUCOSE 145 (H) 07/15/2018 1022   GLUCOSE 139 12/20/2016 1003   BUN 9 07/15/2018 1022   BUN 10.1 12/20/2016 1003   CREATININE 0.49 (L) 07/15/2018 1022   CREATININE 0.7 12/20/2016 1003   CALCIUM 9.7 07/15/2018 1022   CALCIUM 9.5 12/20/2016 1003   GFRNONAA 109 07/15/2018 1022   GFRAA 125 07/15/2018 1022   Lab Results  Component Value Date   HGBA1C 8.6 (H) 07/16/2018   Lab Results  Component Value Date   INSULIN 24.2 07/16/2018   CBC    Component  Value Date/Time   WBC 7.4 07/16/2018 1235   WBC 8.4 01/15/2018 0954   WBC 7.2 12/20/2016 1003   RBC 4.52 07/16/2018 1235   RBC 4.22 01/15/2018 0954   RBC 4.22 01/15/2018 0954   HGB 11.1 07/16/2018 1235   HGB 10.9 (L) 12/20/2016 1003   HCT 35.5 07/16/2018 1235   HCT 34.8 12/20/2016 1003   PLT 312 01/15/2018 0954   PLT 317 12/20/2016 1003   MCV 79 07/16/2018 1235   MCV 80.6 12/20/2016 1003   MCH 24.6 (L) 07/16/2018 1235   MCH 25.6 01/15/2018 0954   MCHC 31.3 (L) 07/16/2018 1235   MCHC 31.2 (L) 01/15/2018 0954   RDW 14.1 07/16/2018 1235   RDW 14.9 (H) 12/20/2016 1003   LYMPHSABS 2.2 07/16/2018 1235   LYMPHSABS 2.0 12/20/2016 1003   MONOABS 0.5 01/15/2018 0954   MONOABS 0.5 12/20/2016 1003   EOSABS 0.2 07/16/2018 1235   BASOSABS 0.0 07/16/2018 1235   BASOSABS 0.0 12/20/2016 1003   Iron/TIBC/Ferritin/ %Sat    Component Value Date/Time   IRON 38 (L) 12/20/2016 1003   TIBC 244 12/20/2016 1003   FERRITIN 590 (H) 12/20/2016 1003   IRONPCTSAT 16 (L) 12/20/2016 1003   Lipid Panel     Component Value Date/Time   CHOL 151 07/16/2018 1235   TRIG 110 07/16/2018 1235   HDL 50 07/16/2018 1235   LDLCALC 79 07/16/2018 1235   Hepatic Function Panel     Component Value Date/Time   PROT 7.0 12/20/2016 1003   ALBUMIN 3.7 12/20/2016 1003   AST 15 12/20/2016 1003   ALT 13 12/20/2016 1003   ALKPHOS 87 12/20/2016 1003   BILITOT 0.49 12/20/2016 1003      Component Value Date/Time   TSH 3.690 07/16/2018 1235   Vitamin D There are no recent lab results  INDIRECT CALORIMETER done today shows a VO2 of 324 and a REE of 2253. Her calculated basal metabolic rate is 7253 thus her basal metabolic rate is better than expected.    ASSESSMENT AND PLAN: Other fatigue - Plan: CBC With Differential, Vitamin B12, Folate, T3, T4, free, TSH  Shortness of breath on exertion -  Plan: Lipid Panel With LDL/HDL Ratio  Type 2 diabetes mellitus without complication, with long-term current use of  insulin (HCC) - Plan: Hemoglobin A1c, Insulin, random  Essential hypertension  Vitamin D deficiency - Plan: VITAMIN D 25 Hydroxy (Vit-D Deficiency, Fractures)  Depression screening  At risk for heart disease  Class 3 severe obesity with serious comorbidity and body mass index (BMI) of 50.0 to 59.9 in adult, unspecified obesity type ([place])  PLAN:  Fatigue Almeter was informed that her fatigue may be related to obesity, depression or many other causes. Labs will be ordered, and in the meanwhile Tawnie has agreed to work on diet, exercise and weight loss to help with fatigue. Proper sleep hygiene was discussed including the need for 7-8 hours of quality sleep each night. A sleep study was not ordered based on symptoms and Epworth score. We will order indirect calorimetry today.  Dyspnea on exertion Cheryel's shortness of breath appears to be obesity related and exercise induced. She has agreed to work on weight loss and gradually increase exercise to treat her exercise induced shortness of breath. If Taralynn follows our instructions and loses weight without improvement of her shortness of breath, we will plan to refer to pulmonology. We will order indirect calorimetry and labs today. We will monitor this condition regularly. Yuette agrees to this plan.  Diabetes II Marisal has been given extensive diabetes education by myself today including ideal fasting and post-prandial blood glucose readings, individual ideal Hgb A1c goals and hypoglycemia prevention. Hypoglycemic protocol was discussed today. We discussed the importance of good blood sugar control to decrease the likelihood of diabetic complications such as nephropathy, neuropathy, limb loss, blindness, coronary artery disease, and death. We discussed the importance of intensive lifestyle modification including diet, exercise and weight loss as the first line treatment for diabetes. We will check Hgb A1c and insulin level today and Deicy will  follow up at the agreed upon time.  Hypertension We discussed sodium restriction, working on healthy weight loss, and a regular exercise program as the means to achieve improved blood pressure control. Deondria agreed with this plan and agreed to follow up as directed. We will continue to monitor her blood pressure as well as her progress with the above lifestyle modifications. She will continue her medications as prescribed and will watch for signs of hypotension as she continues her lifestyle modifications. We will check fasting lipid panel today.  Cardiovascular risk counseling Jearldine was given extended (15 minutes) coronary artery disease prevention counseling today. She is 57 y.o. female and has risk factors for heart disease including obesity, diabetes and hypertension We discussed intensive lifestyle modifications today with an emphasis on specific weight loss instructions and strategies. Pt was also informed of the importance of increasing exercise and decreasing saturated fats to help prevent heart disease.  Vitamin D Deficiency Konya was informed that low vitamin D levels contributes to fatigue and are associated with obesity, breast, and colon cancer. She will continue to take OTC vitamin D and will follow up for routine testing of vitamin D, at least 2-3 times per year. She was informed of the risk of over-replacement of vitamin D and agrees to not increase her dose unless she discusses this with [name] first. We will check vitamin D level today.  Depression Screen Makailee had a mildly positive depression screening. Depression is commonly associated with obesity and often results in emotional eating behaviors. We will monitor this closely and work on CBT to help improve the non-hunger eating  patterns. Referral to Psychology may be required if no improvement is seen as she continues in our clinic.  Obesity Cicily is currently in the action stage of change and her goal is to continue with weight  loss efforts She has agreed to follow the Category 3 plan Bonnita has been instructed to work up to a goal of 150 minutes of combined cardio and strengthening exercise per week for weight loss and overall health benefits. We discussed the following Behavioral Modification Strategies today: planning for success, increasing lean protein intake, increasing vegetables and work on meal planning and easy cooking plans  Massie has agreed to follow up with our clinic in 2 weeks. She was informed of the importance of frequent follow up visits to maximize her success with intensive lifestyle modifications for her multiple health conditions. She was informed we would discuss her lab results at her next visit unless there is a critical issue that needs to be addressed sooner. Raylee agreed to keep her next visit at the agreed upon time to discuss these results.    OBESITY BEHAVIORAL INTERVENTION VISIT  Today's visit was # 1   Starting weight: 330 lbs Starting date: 07/16/2018 Today's weight : 330 lbs Today's date: 07/16/2018 Total lbs lost to date: 0   ASK: We discussed the diagnosis of obesity with Debbe Mounts today and Ivin Booty agreed to give [name] permission to discuss obesity behavioral modification therapy today.  ASSESS: Aaylah has the diagnosis of obesity and her BMI today is 50.19 Noma is in the action stage of change   ADVISE: Analysse was educated on the multiple health risks of obesity as well as the benefit of weight loss to improve her health. She was advised of the need for long term treatment and the importance of lifestyle modifications to improve her current health and to decrease her risk of future health problems.  AGREE: Multiple dietary modification options and treatment options were discussed and  Karne agreed to follow the recommendations documented in the above note.  ARRANGE: Saryiah was educated on the importance of frequent visits to treat obesity as outlined per CMS and  USPSTF guidelines and agreed to schedule her next follow up appointment today.   I, Doreene Nest, am acting as transcriptionist for Eber Jones, MD   I have reviewed the above documentation for accuracy and completeness, and I agree with the above. - Ilene Qua, MD

## 2018-07-20 NOTE — Telephone Encounter (Signed)
-----   Message from Skeet Latch, MD sent at 07/16/2018  3:29 AM EST ----- Normal kidney function and electrolytes.  Glucose is high.

## 2018-07-20 NOTE — Telephone Encounter (Signed)
Left message to call back  

## 2018-07-24 NOTE — Telephone Encounter (Signed)
Advised patient of lab results  

## 2018-07-28 ENCOUNTER — Ambulatory Visit (HOSPITAL_COMMUNITY): Payer: 59

## 2018-07-28 ENCOUNTER — Ambulatory Visit (HOSPITAL_COMMUNITY)
Admission: RE | Admit: 2018-07-28 | Discharge: 2018-07-28 | Disposition: A | Discharge status: Home / Self Care | Payer: 59 | Source: Ambulatory Visit | Attending: Cardiovascular Disease | Admitting: Cardiovascular Disease

## 2018-07-28 DIAGNOSIS — K449 Diaphragmatic hernia without obstruction or gangrene: Secondary | ICD-10-CM | POA: Insufficient documentation

## 2018-07-28 DIAGNOSIS — R0789 Other chest pain: Secondary | ICD-10-CM | POA: Insufficient documentation

## 2018-07-28 MED ORDER — IOPAMIDOL (ISOVUE-370) INJECTION 76%
100.0000 mL | Freq: Once | INTRAVENOUS | Status: AC | PRN
  Administered 2018-07-28: 80 mL via INTRAVENOUS

## 2018-07-28 MED ORDER — NITROGLYCERIN 0.4 MG SL SUBL
0.8000 mg | SUBLINGUAL_TABLET | Freq: Once | SUBLINGUAL | Status: AC
  Administered 2018-07-28: 0.8 mg via SUBLINGUAL
  Filled 2018-07-28: qty 25

## 2018-07-28 MED ORDER — NITROGLYCERIN 0.4 MG SL SUBL
SUBLINGUAL_TABLET | SUBLINGUAL | Status: AC
  Filled 2018-07-28: qty 2

## 2018-07-30 ENCOUNTER — Ambulatory Visit (INDEPENDENT_AMBULATORY_CARE_PROVIDER_SITE_OTHER): Payer: 59 | Admitting: Family Medicine

## 2018-07-30 VITALS — BP 132/78 | HR 57 | Temp 98.0°F | Ht 68.0 in | Wt 329.0 lb

## 2018-07-30 DIAGNOSIS — Z6841 Body Mass Index (BMI) 40.0 and over, adult: Secondary | ICD-10-CM

## 2018-07-30 DIAGNOSIS — E559 Vitamin D deficiency, unspecified: Secondary | ICD-10-CM | POA: Diagnosis not present

## 2018-07-30 DIAGNOSIS — E119 Type 2 diabetes mellitus without complications: Secondary | ICD-10-CM

## 2018-07-30 DIAGNOSIS — E538 Deficiency of other specified B group vitamins: Secondary | ICD-10-CM | POA: Diagnosis not present

## 2018-07-30 DIAGNOSIS — Z794 Long term (current) use of insulin: Secondary | ICD-10-CM

## 2018-07-30 DIAGNOSIS — Z9189 Other specified personal risk factors, not elsewhere classified: Secondary | ICD-10-CM | POA: Diagnosis not present

## 2018-07-30 MED ORDER — VITAMIN D (ERGOCALCIFEROL) 1.25 MG (50000 UNIT) PO CAPS: 50000.0000 [IU] | ORAL_CAPSULE | ORAL | 0 refills | Status: DC

## 2018-07-31 DIAGNOSIS — R0789 Other chest pain: Secondary | ICD-10-CM | POA: Diagnosis not present

## 2018-08-04 NOTE — Progress Notes (Signed)
Office: [phone]  /  Fax: [phone]   HPI:   Chief Complaint: OBESITY Erin [name] is here to discuss her progress with her obesity treatment plan. She is on the  follow the Category 3 plan and is following her eating plan approximately 30 % of the time. She states she is exercising 0 minutes 0 times per week. Erin [name] took a few days to get on meal plan and failed to prepare all food for days she worked. She failed to find 45 calorie bread. She voices concern about Thanksgiving as there will be significant food temptations.  Her weight is (!) 329 lb (149.2 kg) today and has had a weight loss of 1 pounds over a period of 2 weeks since her last visit. She has lost 1 lb since starting treatment with [name].  Diabetes II Erin [name] has a diagnosis of diabetes type II. Erin [name] states she does not have BG log today and denies any hypoglycemic episodes. Last A1c was 8.6 and Insulin level of 24.2. She has been working on intensive lifestyle modifications including diet, exercise, and weight loss to help control her blood glucose levels. She is currently on glipizide and metformin.   Vitamin D deficiency Erin [name] has a diagnosis of vitamin D deficiency. She is currently taking vit D 1,000 IU and denies nausea, vomiting or muscle weakness. She reports fatigue.   Ref. Range 07/16/2018 12:35  Vitamin D, 25-Hydroxy Latest Ref Range: 30.0 - 100.0 ng/mL 32.5   B12 Deficiency  Erin [name] has a diagnosis of B12 insufficiency and notes fatigue. This is not a new diagnosis. She is not a vegetarian and does not have a previous diagnosis of pernicious anemia. She does not have a history of weight loss surgery. She is currently taking a multivitamin daily and metformin.   At risk for osteopenia and osteoporosis Erin [name] is at higher risk of osteopenia and osteoporosis due to vitamin D deficiency.   ALLERGIES: No Known Allergies  MEDICATIONS: Current Outpatient Medications on File Prior to Visit  Medication Sig Dispense  Refill  . aspirin 81 MG tablet Take 81 mg by mouth daily.    . calcium acetate (PHOSLO) 667 MG capsule Take 667 mg by mouth 1 day or 1 dose.    . Cholecalciferol (VITAMIN D-3 PO) Take 1,000 Units by mouth daily.     [name] glipiZIDE (GLUCOTROL XL) 10 MG 24 hr tablet Take 10 mg by mouth daily.  5  . ibuprofen (ADVIL,MOTRIN) 800 MG tablet TAKE 1 TABLET(S) BY MOUTH 2 TIMES A DAY AS NEEDED [PRN]  3  . losartan (COZAAR) 50 MG tablet Take 50 mg by mouth daily.  5  . metFORMIN (GLUCOPHAGE-XR) 500 MG 24 hr tablet Take 1,000 mg by mouth 2 (two) times daily.  5  . metoprolol tartrate (LOPRESSOR) 25 MG tablet Take 1 tablet (25 mg total) by mouth 2 (two) times daily. 60 tablet 11  . Multiple Vitamin (MULTIVITAMIN) capsule Take 1 capsule by mouth daily.    . rosuvastatin (CRESTOR) 10 MG tablet Take 1 tablet (10 mg total) by mouth daily. 90 tablet 3  . vitamin C (ASCORBIC ACID) 500 MG tablet Take 500 mg by mouth daily.     No current facility-administered medications on file prior to visit.     PAST MEDICAL HISTORY: Past Medical History:  Diagnosis Date  . Anemia   . Atypical chest pain 06/13/2016  . Back pain   . Breast nodule 2009   Right   . Diabetes mellitus   .  Fibroid    Symptomatic  . H/O dysmenorrhea   . H/O hematuria   . H/O hypercholesterolemia   . H/O: menorrhagia   . H/O: obesity   . HPV in female   . Hypertension   . Microhematuria 2012  . Shortness of breath 06/13/2016  . Yeast vaginitis 2008    PAST SURGICAL HISTORY: Past Surgical History:  Procedure Laterality Date  . ABDOMINAL HYSTERECTOMY    . HYSTEROSCOPY W/D&C  2003  . POLYPECTOMY  2008  . TUBAL LIGATION  1984   Bilateral    SOCIAL HISTORY: Social History   Tobacco Use  . Smoking status: Never Smoker  . Smokeless tobacco: Never Used  Substance Use Topics  . Alcohol use: No  . Drug use: No    FAMILY HISTORY: Family History  Problem Relation Age of Onset  . Diabetes Mother   . Valvular heart disease Mother    . Diabetes Brother   . Hypertension Father   . Stroke Father     Ref. Range 07/16/2018 12:35  Vitamin D, 25-Hydroxy Latest Ref Range: 30.0 - 100.0 ng/mL 32.5   ROS: Review of Systems  Constitutional: Positive for malaise/fatigue and weight loss.  Gastrointestinal: Negative for nausea and vomiting.  Musculoskeletal:       Negative for muscle weakness  Endo/Heme/Allergies:       Negative for hypoglycemia    PHYSICAL EXAM: Blood pressure 132/78, pulse (!) 57, temperature 98 F (36.7 C), temperature source Oral, height 5\' 8"  (1.727 m), weight (!) 329 lb (149.2 kg), SpO2 100 %. Body mass index is 50.02 kg/m. Physical Exam  Constitutional: She is oriented to person, place, and time. She appears well-developed and well-nourished.  HENT:  Head: Normocephalic.  Neck: Normal range of motion.  Cardiovascular: Normal rate.  Pulmonary/Chest: Effort normal.  Musculoskeletal: Normal range of motion.  Neurological: She is alert and oriented to person, place, and time.  Skin: Skin is warm and dry.  Psychiatric: She has a normal mood and affect. Her behavior is normal.  Vitals reviewed.   RECENT LABS AND TESTS: BMET    Component Value Date/Time   NA 141 07/15/2018 1022   NA 141 12/20/2016 1003   K 4.4 07/15/2018 1022   K 4.1 12/20/2016 1003   CL 101 07/15/2018 1022   CO2 26 07/15/2018 1022   CO2 28 12/20/2016 1003   GLUCOSE 145 (H) 07/15/2018 1022   GLUCOSE 139 12/20/2016 1003   BUN 9 07/15/2018 1022   BUN 10.1 12/20/2016 1003   CREATININE 0.49 (L) 07/15/2018 1022   CREATININE 0.7 12/20/2016 1003   CALCIUM 9.7 07/15/2018 1022   CALCIUM 9.5 12/20/2016 1003   GFRNONAA 109 07/15/2018 1022   GFRAA 125 07/15/2018 1022   Lab Results  Component Value Date   HGBA1C 8.6 (H) 07/16/2018   Lab Results  Component Value Date   INSULIN 24.2 07/16/2018   CBC    Component Value Date/Time   WBC 7.4 07/16/2018 1235   WBC 8.4 01/15/2018 0954   WBC 7.2 12/20/2016 1003   RBC 4.52  07/16/2018 1235   RBC 4.22 01/15/2018 0954   RBC 4.22 01/15/2018 0954   HGB 11.1 07/16/2018 1235   HGB 10.9 (L) 12/20/2016 1003   HCT 35.5 07/16/2018 1235   HCT 34.8 12/20/2016 1003   PLT 312 01/15/2018 0954   PLT 317 12/20/2016 1003   MCV 79 07/16/2018 1235   MCV 80.6 12/20/2016 1003   MCH 24.6 (L) 07/16/2018 1235   MCH  25.6 01/15/2018 0954   MCHC 31.3 (L) 07/16/2018 1235   MCHC 31.2 (L) 01/15/2018 0954   RDW 14.1 07/16/2018 1235   RDW 14.9 (H) 12/20/2016 1003   LYMPHSABS 2.2 07/16/2018 1235   LYMPHSABS 2.0 12/20/2016 1003   MONOABS 0.5 01/15/2018 0954   MONOABS 0.5 12/20/2016 1003   EOSABS 0.2 07/16/2018 1235   BASOSABS 0.0 07/16/2018 1235   BASOSABS 0.0 12/20/2016 1003   Iron/TIBC/Ferritin/ %Sat    Component Value Date/Time   IRON 38 (L) 12/20/2016 1003   TIBC 244 12/20/2016 1003   FERRITIN 590 (H) 12/20/2016 1003   IRONPCTSAT 16 (L) 12/20/2016 1003   Lipid Panel     Component Value Date/Time   CHOL 151 07/16/2018 1235   TRIG 110 07/16/2018 1235   HDL 50 07/16/2018 1235   LDLCALC 79 07/16/2018 1235   Hepatic Function Panel     Component Value Date/Time   PROT 7.0 12/20/2016 1003   ALBUMIN 3.7 12/20/2016 1003   AST 15 12/20/2016 1003   ALT 13 12/20/2016 1003   ALKPHOS 87 12/20/2016 1003   BILITOT 0.49 12/20/2016 1003      Component Value Date/Time   TSH 3.690 07/16/2018 1235   TSH 5.22 (H) 06/13/2016 0918   TSH 1.960 03/08/2015 1257    ASSESSMENT AND PLAN: Type 2 diabetes mellitus without complication, with long-term current use of insulin (HCC)  Vitamin D deficiency - Plan: Vitamin D, Ergocalciferol, (DRISDOL) 1.25 MG (50000 UT) CAPS capsule  Vitamin B12 deficiency  At risk for osteoporosis  Class 3 severe obesity with serious comorbidity and body mass index (BMI) of 50.0 to 59.9 in adult, unspecified obesity type ([place])  PLAN: Diabetes II Erin [name] has been given extensive diabetes education by myself today including ideal fasting and  post-prandial blood glucose readings, individual ideal HgA1c goals  and hypoglycemia prevention. We discussed the importance of good blood sugar control to decrease the likelihood of diabetic complications such as nephropathy, neuropathy, limb loss, blindness, coronary artery disease, and death. We discussed the importance of intensive lifestyle modification including diet, exercise and weight loss as the first line treatment for diabetes. We discussed hypoglycemia again today, encouraged her to follow the meal plan. Erin [name] agrees to continue her diabetes medications and will follow up at the agreed upon time.  Vitamin D Deficiency Erin [name] was informed that low vitamin D levels contributes to fatigue and are associated with obesity, breast, and colon cancer. She agrees to continue to take prescription Vit D @50 ,000 IU every week #4 with no refills and will follow up for routine testing of vitamin D, at least 2-3 times per year. She was informed of the risk of over-replacement of vitamin D and agrees to not increase her dose unless she discusses this with [name] first. Agrees to follow up with our clinic as directed.   B12 Deficiency Erin [name] will work on increasing B12 rich foods in her diet. B12 supplementation was not prescribed today. We will plan on rechecking labs in the next 3 months.  At risk for osteopenia and osteoporosis Erin [name] was given extended  (30 minutes) osteoporosis prevention counseling today. Erin [name] is at risk for osteopenia and osteoporsis due to her vitamin D deficiency. She was encouraged to take her vitamin D and follow her higher calcium diet and increase strengthening exercise to help strengthen her bones and decrease her risk of osteopenia and osteoporosis.  Obesity Erin [name] is currently in the action stage of change. As such, her goal is to continue with weight  loss efforts She has agreed to follow the Category 3 plan Erin [name] has been instructed to work up to a goal of 150 minutes of  combined cardio and strengthening exercise per week for weight loss and overall health benefits. We discussed the following Behavioral Modification Stratagies today: increasing lean protein intake, increasing vegetables, planning for success, and work on meal planning and easy cooking plans.    Erin [name] has agreed to follow up with our clinic in 2 weeks. She was informed of the importance of frequent follow up visits to maximize her success with intensive lifestyle modifications for her multiple health conditions.   OBESITY BEHAVIORAL INTERVENTION VISIT  Today's visit was # 2   Starting weight: 330 lb Starting date: 07/16/18 Today's weight : Weight: (!) 329 lb (149.2 kg)  Today's date: 07/30/18 Total lbs lost to date: 1 lb    ASK: We discussed the diagnosis of obesity with Erin [name] today and Erin [name] agreed to give [name] permission to discuss obesity behavioral modification therapy today.  ASSESS: Erin [name] has the diagnosis of obesity and her BMI today is 50.04 Erin [name] is in the action stage of change    ADVISE: Erin [name] was educated on the multiple health risks of obesity as well as the benefit of weight loss to improve her health. She was advised of the need for long term treatment and the importance of lifestyle modifications to improve her current health and to decrease her risk of future health problems.  AGREE: Multiple dietary modification options and treatment options were discussed and  Erin [name] agreed to follow the recommendations documented in the above note.  ARRANGE: Erin [name] was educated on the importance of frequent visits to treat obesity as outlined per CMS and USPSTF guidelines and agreed to schedule her next follow up appointment today.  I, Renee Ramus, am acting as transcriptionist for Ilene Qua, MD   I have reviewed the above documentation for accuracy and completeness, and I agree with the above. - Ilene Qua, MD

## 2018-08-20 ENCOUNTER — Ambulatory Visit (INDEPENDENT_AMBULATORY_CARE_PROVIDER_SITE_OTHER): Payer: Self-pay | Admitting: Family Medicine

## 2018-08-27 ENCOUNTER — Encounter (INDEPENDENT_AMBULATORY_CARE_PROVIDER_SITE_OTHER): Payer: Self-pay

## 2018-08-27 ENCOUNTER — Ambulatory Visit (INDEPENDENT_AMBULATORY_CARE_PROVIDER_SITE_OTHER): Payer: Self-pay | Admitting: Family Medicine

## 2018-09-15 DIAGNOSIS — E1165 Type 2 diabetes mellitus with hyperglycemia: Secondary | ICD-10-CM | POA: Diagnosis not present

## 2018-09-15 DIAGNOSIS — E785 Hyperlipidemia, unspecified: Secondary | ICD-10-CM | POA: Diagnosis not present

## 2018-09-15 DIAGNOSIS — E559 Vitamin D deficiency, unspecified: Secondary | ICD-10-CM | POA: Diagnosis not present

## 2018-09-15 DIAGNOSIS — D500 Iron deficiency anemia secondary to blood loss (chronic): Secondary | ICD-10-CM | POA: Diagnosis not present

## 2018-09-15 DIAGNOSIS — I10 Essential (primary) hypertension: Secondary | ICD-10-CM | POA: Diagnosis not present

## 2018-09-15 DIAGNOSIS — E119 Type 2 diabetes mellitus without complications: Secondary | ICD-10-CM | POA: Diagnosis not present

## 2018-09-22 ENCOUNTER — Ambulatory Visit (INDEPENDENT_AMBULATORY_CARE_PROVIDER_SITE_OTHER): Payer: 59 | Admitting: Family Medicine

## 2018-09-22 ENCOUNTER — Encounter (INDEPENDENT_AMBULATORY_CARE_PROVIDER_SITE_OTHER): Payer: Self-pay | Admitting: Family Medicine

## 2018-09-22 VITALS — BP 134/77 | HR 63 | Temp 97.9°F | Ht 68.0 in | Wt 329.0 lb

## 2018-09-22 DIAGNOSIS — E1165 Type 2 diabetes mellitus with hyperglycemia: Secondary | ICD-10-CM

## 2018-09-22 DIAGNOSIS — Z6841 Body Mass Index (BMI) 40.0 and over, adult: Secondary | ICD-10-CM

## 2018-09-22 DIAGNOSIS — I10 Essential (primary) hypertension: Secondary | ICD-10-CM

## 2018-09-23 NOTE — Progress Notes (Signed)
Office: [phone]  /  Fax: [phone]   HPI:   Chief Complaint: OBESITY Erin [name] is here to discuss her progress with her obesity treatment plan. She is on the Category 3 plan and is following her eating plan approximately 0 % of the time. She states she is exercising 0 minutes 0 times per week. Erin [name] has had a stressful and busy last few weeks. She is ready to be more strict and consistent on the plan. She has most food for the plan in the house, she is going to the store tomorrow. She consistently reports wanting to go out to eat.  Her weight is (!) 329 lb (149.2 kg) today and has not lost weight since her last visit. She has lost 1 lb since starting treatment with [name].  Hypertension Erin [name] is a 58 y.o. female with hypertension. Keyaira's blood pressure is controlled today. She denies chest pain, chest pressure, or headaches. She is working weight loss to help control her blood pressure with the goal of decreasing her risk of heart attack and stroke.   Diabetes II with Hyperglycemia Erin [name] has a diagnosis of diabetes type II. Erin [name] is on statin. ARB, and metformin. She states fasting BGs range around 175. Her Hgb A1c went from 8.4 to 8.6. She denies hypoglycemia. She has been working on intensive lifestyle modifications including diet, exercise, and weight loss to help control her blood glucose levels.  ASSESSMENT AND PLAN:  Essential hypertension  Type 2 diabetes mellitus with hyperglycemia, without long-term current use of insulin (HCC)  Class 3 severe obesity with serious comorbidity and body mass index (BMI) of 50.0 to 59.9 in adult, unspecified obesity type ([place])  PLAN:  Hypertension We discussed sodium restriction, working on healthy weight loss, and a regular exercise program as the means to achieve improved blood pressure control. Erin [name] agreed with this plan and agreed to follow up as directed. We will continue to monitor her blood pressure as well as her progress  with the above lifestyle modifications. Erin [name] agrees to continue her current medications and will watch for signs of hypotension as she continues her lifestyle modifications. Erin [name] agrees to follow up with our clinic in 2 weeks.  Diabetes II with Hyperglycemia Erin [name] has been given extensive diabetes education by myself today including ideal fasting and post-prandial blood glucose readings, individual ideal Hgb A1c goals and hypoglycemia prevention. We discussed the importance of good blood sugar control to decrease the likelihood of diabetic complications such as nephropathy, neuropathy, limb loss, blindness, coronary artery disease, and death. We discussed the importance of intensive lifestyle modification including diet, exercise and weight loss as the first line treatment for diabetes. Erin [name] agrees to continue her current diabetes medications and we will discuss change of medicine at next appointment. Erin [name] agrees to follow up with our clinic in 2 weeks.  I spent > than 50% of the 15 minute visit on counseling as documented in the note.  Obesity Erin [name] is currently in the action stage of change. As such, her goal is to continue with weight loss efforts She has agreed to follow the Category 3 plan Erin [name] has been instructed to work up to a goal of 150 minutes of combined cardio and strengthening exercise per week for weight loss and overall health benefits. We discussed the following Behavioral Modification Strategies today: increasing lean protein intake, increasing vegetables, work on meal planning and easy cooking plans, better snacking choices, and planning for success   Erin [name] has agreed to follow up  with our clinic in 2 weeks. She was informed of the importance of frequent follow up visits to maximize her success with intensive lifestyle modifications for her multiple health conditions.  ALLERGIES: No Known Allergies  MEDICATIONS: Current Outpatient Medications on File Prior to  Visit  Medication Sig Dispense Refill  . aspirin 81 MG tablet Take 81 mg by mouth daily.    . calcium acetate (PHOSLO) 667 MG capsule Take 667 mg by mouth 1 day or 1 dose.    . Cholecalciferol (VITAMIN D-3 PO) Take 1,000 Units by mouth daily.     [name] glipiZIDE (GLUCOTROL XL) 10 MG 24 hr tablet Take 10 mg by mouth daily.  5  . ibuprofen (ADVIL,MOTRIN) 800 MG tablet TAKE 1 TABLET(S) BY MOUTH 2 TIMES A DAY AS NEEDED [PRN]  3  . losartan (COZAAR) 50 MG tablet Take 50 mg by mouth daily.  5  . metFORMIN (GLUCOPHAGE-XR) 500 MG 24 hr tablet Take 1,000 mg by mouth 2 (two) times daily.  5  . metoprolol tartrate (LOPRESSOR) 25 MG tablet Take 1 tablet (25 mg total) by mouth 2 (two) times daily. 60 tablet 11  . Multiple Vitamin (MULTIVITAMIN) capsule Take 1 capsule by mouth daily.    . rosuvastatin (CRESTOR) 10 MG tablet Take 1 tablet (10 mg total) by mouth daily. 90 tablet 3  . vitamin C (ASCORBIC ACID) 500 MG tablet Take 500 mg by mouth daily.    . Vitamin D, Ergocalciferol, (DRISDOL) 1.25 MG (50000 UT) CAPS capsule Take 1 capsule (50,000 Units total) by mouth every 7 (seven) days. 4 capsule 0   No current facility-administered medications on file prior to visit.     PAST MEDICAL HISTORY: Past Medical History:  Diagnosis Date  . Anemia   . Atypical chest pain 06/13/2016  . Back pain   . Breast nodule 2009   Right   . Diabetes mellitus   . Fibroid    Symptomatic  . H/O dysmenorrhea   . H/O hematuria   . H/O hypercholesterolemia   . H/O: menorrhagia   . H/O: obesity   . HPV in female   . Hypertension   . Microhematuria 2012  . Shortness of breath 06/13/2016  . Yeast vaginitis 2008    PAST SURGICAL HISTORY: Past Surgical History:  Procedure Laterality Date  . ABDOMINAL HYSTERECTOMY    . HYSTEROSCOPY W/D&C  2003  . POLYPECTOMY  2008  . TUBAL LIGATION  1984   Bilateral    SOCIAL HISTORY: Social History   Tobacco Use  . Smoking status: Never Smoker  . Smokeless tobacco: Never Used    Substance Use Topics  . Alcohol use: No  . Drug use: No    FAMILY HISTORY: Family History  Problem Relation Age of Onset  . Diabetes Mother   . Valvular heart disease Mother   . Diabetes Brother   . Hypertension Father   . Stroke Father     ROS: Review of Systems  Constitutional: Negative for weight loss.  Cardiovascular: Negative for chest pain.       Negative chest pressure  Neurological: Negative for headaches.  Endo/Heme/Allergies:       Negative hypoglycemia    PHYSICAL EXAM: Blood pressure 134/77, pulse 63, temperature 97.9 F (36.6 C), temperature source Oral, height 5\' 8"  (1.727 m), weight (!) 329 lb (149.2 kg), SpO2 99 %. Body mass index is 50.02 kg/m. Physical Exam Vitals signs reviewed.  Constitutional:      Appearance: Normal appearance. She is  obese.  Cardiovascular:     Rate and Rhythm: Normal rate.     Pulses: Normal pulses.  Pulmonary:     Effort: Pulmonary effort is normal.  Musculoskeletal: Normal range of motion.  Skin:    General: Skin is warm and dry.  Neurological:     Mental Status: She is alert and oriented to person, place, and time.  Psychiatric:        Mood and Affect: Mood normal.        Behavior: Behavior normal.     RECENT LABS AND TESTS: BMET    Component Value Date/Time   NA 141 07/15/2018 1022   NA 141 12/20/2016 1003   K 4.4 07/15/2018 1022   K 4.1 12/20/2016 1003   CL 101 07/15/2018 1022   CO2 26 07/15/2018 1022   CO2 28 12/20/2016 1003   GLUCOSE 145 (H) 07/15/2018 1022   GLUCOSE 139 12/20/2016 1003   BUN 9 07/15/2018 1022   BUN 10.1 12/20/2016 1003   CREATININE 0.49 (L) 07/15/2018 1022   CREATININE 0.7 12/20/2016 1003   CALCIUM 9.7 07/15/2018 1022   CALCIUM 9.5 12/20/2016 1003   GFRNONAA 109 07/15/2018 1022   GFRAA 125 07/15/2018 1022   Lab Results  Component Value Date   HGBA1C 8.6 (H) 07/16/2018   Lab Results  Component Value Date   INSULIN 24.2 07/16/2018   CBC    Component Value Date/Time    WBC 7.4 07/16/2018 1235   WBC 8.4 01/15/2018 0954   WBC 7.2 12/20/2016 1003   RBC 4.52 07/16/2018 1235   RBC 4.22 01/15/2018 0954   RBC 4.22 01/15/2018 0954   HGB 11.1 07/16/2018 1235   HGB 10.9 (L) 12/20/2016 1003   HCT 35.5 07/16/2018 1235   HCT 34.8 12/20/2016 1003   PLT 312 01/15/2018 0954   PLT 317 12/20/2016 1003   MCV 79 07/16/2018 1235   MCV 80.6 12/20/2016 1003   MCH 24.6 (L) 07/16/2018 1235   MCH 25.6 01/15/2018 0954   MCHC 31.3 (L) 07/16/2018 1235   MCHC 31.2 (L) 01/15/2018 0954   RDW 14.1 07/16/2018 1235   RDW 14.9 (H) 12/20/2016 1003   LYMPHSABS 2.2 07/16/2018 1235   LYMPHSABS 2.0 12/20/2016 1003   MONOABS 0.5 01/15/2018 0954   MONOABS 0.5 12/20/2016 1003   EOSABS 0.2 07/16/2018 1235   BASOSABS 0.0 07/16/2018 1235   BASOSABS 0.0 12/20/2016 1003   Iron/TIBC/Ferritin/ %Sat    Component Value Date/Time   IRON 38 (L) 12/20/2016 1003   TIBC 244 12/20/2016 1003   FERRITIN 590 (H) 12/20/2016 1003   IRONPCTSAT 16 (L) 12/20/2016 1003   Lipid Panel     Component Value Date/Time   CHOL 151 07/16/2018 1235   TRIG 110 07/16/2018 1235   HDL 50 07/16/2018 1235   LDLCALC 79 07/16/2018 1235   Hepatic Function Panel     Component Value Date/Time   PROT 7.0 12/20/2016 1003   ALBUMIN 3.7 12/20/2016 1003   AST 15 12/20/2016 1003   ALT 13 12/20/2016 1003   ALKPHOS 87 12/20/2016 1003   BILITOT 0.49 12/20/2016 1003      Component Value Date/Time   TSH 3.690 07/16/2018 1235   TSH 5.22 (H) 06/13/2016 0918   TSH 1.960 03/08/2015 1257      OBESITY BEHAVIORAL INTERVENTION VISIT  Today's visit was # 3   Starting weight: 330 lbs Starting date: 07/16/18 Today's weight : 329 lbs Today's date: 09/22/2018 Total lbs lost to date: 1  ASK: We discussed the diagnosis of obesity with Debbe Mounts today and Mark agreed to give [name] permission to discuss obesity behavioral modification therapy today.  ASSESS: Shanay has the diagnosis of obesity and her BMI today is  50.04 Timica is in the action stage of change   ADVISE: Dimitri was educated on the multiple health risks of obesity as well as the benefit of weight loss to improve her health. She was advised of the need for long term treatment and the importance of lifestyle modifications to improve her current health and to decrease her risk of future health problems.  AGREE: Multiple dietary modification options and treatment options were discussed and  Nelle agreed to follow the recommendations documented in the above note.  ARRANGE: Tayler was educated on the importance of frequent visits to treat obesity as outlined per CMS and USPSTF guidelines and agreed to schedule her next follow up appointment today.  I, Yolander Goodie, am acting as transcriptionist for Erin Qua, MD  I have reviewed the above documentation for accuracy and completeness, and I agree with the above. - Erin Qua, MD

## 2018-10-08 DIAGNOSIS — E1165 Type 2 diabetes mellitus with hyperglycemia: Secondary | ICD-10-CM | POA: Diagnosis not present

## 2018-10-08 DIAGNOSIS — E119 Type 2 diabetes mellitus without complications: Secondary | ICD-10-CM | POA: Diagnosis not present

## 2018-10-13 ENCOUNTER — Ambulatory Visit (INDEPENDENT_AMBULATORY_CARE_PROVIDER_SITE_OTHER): Payer: 59 | Admitting: Family Medicine

## 2018-10-13 ENCOUNTER — Encounter (INDEPENDENT_AMBULATORY_CARE_PROVIDER_SITE_OTHER): Payer: Self-pay | Admitting: Family Medicine

## 2018-10-13 VITALS — BP 135/78 | HR 68 | Temp 98.2°F | Ht 68.0 in | Wt 326.0 lb

## 2018-10-13 DIAGNOSIS — Z9189 Other specified personal risk factors, not elsewhere classified: Secondary | ICD-10-CM

## 2018-10-13 DIAGNOSIS — E1165 Type 2 diabetes mellitus with hyperglycemia: Secondary | ICD-10-CM

## 2018-10-13 DIAGNOSIS — E559 Vitamin D deficiency, unspecified: Secondary | ICD-10-CM | POA: Diagnosis not present

## 2018-10-13 DIAGNOSIS — Z6841 Body Mass Index (BMI) 40.0 and over, adult: Secondary | ICD-10-CM

## 2018-10-13 MED ORDER — VITAMIN D (ERGOCALCIFEROL) 1.25 MG (50000 UNIT) PO CAPS: 50000.0000 [IU] | ORAL_CAPSULE | ORAL | 0 refills | Status: DC

## 2018-10-14 NOTE — Progress Notes (Signed)
Office: [phone]  /  Fax: [phone]   HPI:   Chief Complaint: OBESITY Erin [name] is here to discuss her progress with her obesity treatment plan. She is on the Category 3 plan and is following her eating plan approximately 50 % of the time. She states she is exercising 0 minutes 0 times per week. Erin [name] was following the plan for the majority of the time except for [employer] party 2 days ago. She feels like she isn't hungry, but questions whether eating at night prior to going to bed is beneficial or will it cause weight gain.  Her weight is (!) 326 lb (147.9 kg) today and has had a weight loss of 3 pounds over a period of 3 weeks since her last visit. She has lost 4 lbs since starting treatment with [name].  Diabetes II with Hyperglycemia Erin [name] has a diagnosis of diabetes type II. [name] states fasting BGs range in 130's, and she denies hypoglycemia. She is on metformin, ARB, and statin. Last A1c was 8.6 on 07/16/18. She has been working on intensive lifestyle modifications including diet, exercise, and weight loss to help control her blood glucose levels.  Vitamin D Deficiency Erin [name] has a diagnosis of vitamin D deficiency. She is currently taking prescription Vit D. She notes fatigue and denies nausea, vomiting or muscle weakness.  At risk for osteopenia and osteoporosis Erin [name] is at higher risk of osteopenia and osteoporosis due to vitamin D deficiency.   ASSESSMENT AND PLAN:  Type 2 diabetes mellitus with hyperglycemia, without long-term current use of insulin (HCC)  Vitamin D deficiency - Plan: Vitamin D, Ergocalciferol, (DRISDOL) 1.25 MG (50000 UT) CAPS capsule  At risk for osteoporosis  Class 3 severe obesity with serious comorbidity and body mass index (BMI) of 45.0 to 49.9 in adult, unspecified obesity type ([place])  PLAN:  Diabetes II with Hyperglycemia Erin [name] has been given extensive diabetes education by myself today including ideal fasting and post-prandial blood  glucose readings, individual ideal Hgb A1c goals and hypoglycemia prevention. We discussed the importance of good blood sugar control to decrease the likelihood of diabetic complications such as nephropathy, neuropathy, limb loss, blindness, coronary artery disease, and death. We discussed the importance of intensive lifestyle modification including diet, exercise and weight loss as the first line treatment for diabetes. Erin [name] agrees to continue her current diabetes medications and is to consider initiation of Rybelsus. Erin [name] agrees to follow up with our clinic in 2 weeks.  Vitamin D Deficiency Erin [name] was informed that low vitamin D levels contributes to fatigue and are associated with obesity, breast, and colon cancer. Erin [name] agrees to continue taking prescription Vit D @50 ,000 IU every week #4 and we will refill for 1 month. She will follow up for routine testing of vitamin D, at least 2-3 times per year. She was informed of the risk of over-replacement of vitamin D and agrees to not increase her dose unless she discusses this with [name] first. Erin [name] agrees to follow up with our clinic in 2 weeks.  At risk for osteopenia and osteoporosis Erin [name] is at higher risk of osteopenia and osteoporosis due to vitamin D deficiency.   Obesity Erin [name] is currently in the action stage of change. As such, her goal is to continue with weight loss efforts She has agreed to follow the Category 3 plan Erin [name] has been instructed to work up to a goal of 150 minutes of combined cardio and strengthening exercise per week for weight loss and overall health benefits. We discussed  the following Behavioral Modification Strategies today: increasing lean protein intake, increasing vegetables, work on meal planning and easy cooking plans, and planning for success   Erin [name] has agreed to follow up with our clinic in 2 weeks. She was informed of the importance of frequent follow up visits to maximize her success with intensive  lifestyle modifications for her multiple health conditions.  ALLERGIES: No Known Allergies  MEDICATIONS: Current Outpatient Medications on File Prior to Visit  Medication Sig Dispense Refill  . aspirin 81 MG tablet Take 81 mg by mouth daily.    . calcium acetate (PHOSLO) 667 MG capsule Take 667 mg by mouth 1 day or 1 dose.    . Cholecalciferol (VITAMIN D-3 PO) Take 1,000 Units by mouth daily.     [name] glipiZIDE (GLUCOTROL XL) 10 MG 24 hr tablet Take 10 mg by mouth daily.  5  . ibuprofen (ADVIL,MOTRIN) 800 MG tablet TAKE 1 TABLET(S) BY MOUTH 2 TIMES A DAY AS NEEDED [PRN]  3  . losartan (COZAAR) 50 MG tablet Take 50 mg by mouth daily.  5  . metFORMIN (GLUCOPHAGE-XR) 500 MG 24 hr tablet Take 1,000 mg by mouth 2 (two) times daily.  5  . metoprolol tartrate (LOPRESSOR) 25 MG tablet Take 1 tablet (25 mg total) by mouth 2 (two) times daily. 60 tablet 11  . Multiple Vitamin (MULTIVITAMIN) capsule Take 1 capsule by mouth daily.    . rosuvastatin (CRESTOR) 10 MG tablet Take 1 tablet (10 mg total) by mouth daily. 90 tablet 3  . vitamin C (ASCORBIC ACID) 500 MG tablet Take 500 mg by mouth daily.     No current facility-administered medications on file prior to visit.     PAST MEDICAL HISTORY: Past Medical History:  Diagnosis Date  . Anemia   . Atypical chest pain 06/13/2016  . Back pain   . Breast nodule 2009   Right   . Diabetes mellitus   . Fibroid    Symptomatic  . H/O dysmenorrhea   . H/O hematuria   . H/O hypercholesterolemia   . H/O: menorrhagia   . H/O: obesity   . HPV in female   . Hypertension   . Microhematuria 2012  . Shortness of breath 06/13/2016  . Yeast vaginitis 2008    PAST SURGICAL HISTORY: Past Surgical History:  Procedure Laterality Date  . ABDOMINAL HYSTERECTOMY    . HYSTEROSCOPY W/D&C  2003  . POLYPECTOMY  2008  . TUBAL LIGATION  1984   Bilateral    SOCIAL HISTORY: Social History   Tobacco Use  . Smoking status: Never Smoker  . Smokeless tobacco:  Never Used  Substance Use Topics  . Alcohol use: No  . Drug use: No    FAMILY HISTORY: Family History  Problem Relation Age of Onset  . Diabetes Mother   . Valvular heart disease Mother   . Diabetes Brother   . Hypertension Father   . Stroke Father     ROS: Review of Systems  Constitutional: Positive for malaise/fatigue and weight loss.  Gastrointestinal: Negative for nausea and vomiting.  Musculoskeletal:       Negative muscle weakness  Endo/Heme/Allergies:       Negative hypoglycemia    PHYSICAL EXAM: Blood pressure 135/78, pulse 68, temperature 98.2 F (36.8 C), temperature source Oral, height 5\' 8"  (1.727 m), weight (!) 326 lb (147.9 kg), SpO2 97 %. Body mass index is 49.57 kg/m. Physical Exam Vitals signs reviewed.  Constitutional:      Appearance:  Normal appearance. She is obese.  Cardiovascular:     Rate and Rhythm: Normal rate.     Pulses: Normal pulses.  Pulmonary:     Effort: Pulmonary effort is normal.     Breath sounds: Normal breath sounds.  Musculoskeletal: Normal range of motion.  Skin:    General: Skin is warm and dry.  Neurological:     Mental Status: She is alert and oriented to person, place, and time.  Psychiatric:        Mood and Affect: Mood normal.        Behavior: Behavior normal.     RECENT LABS AND TESTS: BMET    Component Value Date/Time   NA 141 07/15/2018 1022   NA 141 12/20/2016 1003   K 4.4 07/15/2018 1022   K 4.1 12/20/2016 1003   CL 101 07/15/2018 1022   CO2 26 07/15/2018 1022   CO2 28 12/20/2016 1003   GLUCOSE 145 (H) 07/15/2018 1022   GLUCOSE 139 12/20/2016 1003   BUN 9 07/15/2018 1022   BUN 10.1 12/20/2016 1003   CREATININE 0.49 (L) 07/15/2018 1022   CREATININE 0.7 12/20/2016 1003   CALCIUM 9.7 07/15/2018 1022   CALCIUM 9.5 12/20/2016 1003   GFRNONAA 109 07/15/2018 1022   GFRAA 125 07/15/2018 1022   Lab Results  Component Value Date   HGBA1C 8.6 (H) 07/16/2018   Lab Results  Component Value Date    INSULIN 24.2 07/16/2018   CBC    Component Value Date/Time   WBC 7.4 07/16/2018 1235   WBC 8.4 01/15/2018 0954   WBC 7.2 12/20/2016 1003   RBC 4.52 07/16/2018 1235   RBC 4.22 01/15/2018 0954   RBC 4.22 01/15/2018 0954   HGB 11.1 07/16/2018 1235   HGB 10.9 (L) 12/20/2016 1003   HCT 35.5 07/16/2018 1235   HCT 34.8 12/20/2016 1003   PLT 312 01/15/2018 0954   PLT 317 12/20/2016 1003   MCV 79 07/16/2018 1235   MCV 80.6 12/20/2016 1003   MCH 24.6 (L) 07/16/2018 1235   MCH 25.6 01/15/2018 0954   MCHC 31.3 (L) 07/16/2018 1235   MCHC 31.2 (L) 01/15/2018 0954   RDW 14.1 07/16/2018 1235   RDW 14.9 (H) 12/20/2016 1003   LYMPHSABS 2.2 07/16/2018 1235   LYMPHSABS 2.0 12/20/2016 1003   MONOABS 0.5 01/15/2018 0954   MONOABS 0.5 12/20/2016 1003   EOSABS 0.2 07/16/2018 1235   BASOSABS 0.0 07/16/2018 1235   BASOSABS 0.0 12/20/2016 1003   Iron/TIBC/Ferritin/ %Sat    Component Value Date/Time   IRON 38 (L) 12/20/2016 1003   TIBC 244 12/20/2016 1003   FERRITIN 590 (H) 12/20/2016 1003   IRONPCTSAT 16 (L) 12/20/2016 1003   Lipid Panel     Component Value Date/Time   CHOL 151 07/16/2018 1235   TRIG 110 07/16/2018 1235   HDL 50 07/16/2018 1235   LDLCALC 79 07/16/2018 1235   Hepatic Function Panel     Component Value Date/Time   PROT 7.0 12/20/2016 1003   ALBUMIN 3.7 12/20/2016 1003   AST 15 12/20/2016 1003   ALT 13 12/20/2016 1003   ALKPHOS 87 12/20/2016 1003   BILITOT 0.49 12/20/2016 1003      Component Value Date/Time   TSH 3.690 07/16/2018 1235   TSH 5.22 (H) 06/13/2016 0918   TSH 1.960 03/08/2015 1257      OBESITY BEHAVIORAL INTERVENTION VISIT  Today's visit was # 4   Starting weight: 330 lbs Starting date: 07/16/18 Today's weight : 326  lbs  Today's date: 10/13/2018 Total lbs lost to date: 4    ASK: We discussed the diagnosis of obesity with Debbe Mounts today and Soraiya agreed to give [name] permission to discuss obesity behavioral modification therapy  today.  ASSESS: Shironda has the diagnosis of obesity and her BMI today is 49.58 Harper is in the action stage of change   ADVISE: Mattalyn was educated on the multiple health risks of obesity as well as the benefit of weight loss to improve her health. She was advised of the need for long term treatment and the importance of lifestyle modifications to improve her current health and to decrease her risk of future health problems.  AGREE: Multiple dietary modification options and treatment options were discussed and  Laverta agreed to follow the recommendations documented in the above note.  ARRANGE: Toba was educated on the importance of frequent visits to treat obesity as outlined per CMS and USPSTF guidelines and agreed to schedule her next follow up appointment today.  I, Schelly Chuba, am acting as transcriptionist for Ilene Qua, MD  I have reviewed the above documentation for accuracy and completeness, and I agree with the above. - Ilene Qua, MD

## 2018-10-28 ENCOUNTER — Ambulatory Visit (INDEPENDENT_AMBULATORY_CARE_PROVIDER_SITE_OTHER): Payer: Self-pay | Admitting: Family Medicine

## 2018-11-01 ENCOUNTER — Other Ambulatory Visit (INDEPENDENT_AMBULATORY_CARE_PROVIDER_SITE_OTHER): Payer: Self-pay | Admitting: Family Medicine

## 2018-11-01 DIAGNOSIS — E559 Vitamin D deficiency, unspecified: Secondary | ICD-10-CM

## 2018-11-11 ENCOUNTER — Ambulatory Visit (INDEPENDENT_AMBULATORY_CARE_PROVIDER_SITE_OTHER): Payer: Self-pay | Admitting: Family Medicine

## 2018-11-26 ENCOUNTER — Ambulatory Visit (INDEPENDENT_AMBULATORY_CARE_PROVIDER_SITE_OTHER): Payer: Self-pay | Admitting: Family Medicine

## 2018-12-15 ENCOUNTER — Other Ambulatory Visit: Payer: Self-pay

## 2018-12-15 ENCOUNTER — Ambulatory Visit (INDEPENDENT_AMBULATORY_CARE_PROVIDER_SITE_OTHER): Payer: 59 | Admitting: Family Medicine

## 2018-12-15 ENCOUNTER — Encounter (INDEPENDENT_AMBULATORY_CARE_PROVIDER_SITE_OTHER): Payer: Self-pay | Admitting: Family Medicine

## 2018-12-15 DIAGNOSIS — E119 Type 2 diabetes mellitus without complications: Secondary | ICD-10-CM

## 2018-12-15 DIAGNOSIS — E559 Vitamin D deficiency, unspecified: Secondary | ICD-10-CM | POA: Diagnosis not present

## 2018-12-15 DIAGNOSIS — Z6841 Body Mass Index (BMI) 40.0 and over, adult: Secondary | ICD-10-CM | POA: Diagnosis not present

## 2018-12-15 MED ORDER — VITAMIN D (ERGOCALCIFEROL) 1.25 MG (50000 UNIT) PO CAPS: 50000.0000 [IU] | ORAL_CAPSULE | ORAL | 0 refills | Status: DC

## 2018-12-15 NOTE — Progress Notes (Signed)
Office: [phone]  /  Fax: [phone] TeleHealth Visit:  Erin [name] has verbally consented to this TeleHealth visit today. The patient is located at home, the provider is located at the [facility] office. The participants in this visit include the listed provider and patient and provider's assistant. The visit was conducted today via webex.  HPI:   Chief Complaint: OBESITY Erin [name] is here to discuss her progress with her obesity treatment plan. She is on the Category 3 plan and is following her eating plan approximately 70 % of the time. She states she is exercising 0 minutes 0 times per week. Erin [name] is working 7 days per week and is feeling stressed. She hasn't been since early February. She has some food of the meal plan, meat especially is harder to find. She is waiting for some down time to relax.  We were unable to weigh the patient today for this TeleHealth visit. She feels as if she has lost 6 lbs since her last visit. She has lost 4 lbs since starting treatment with [name].  Diabetes II with Hyperglycemia Erin [name] has a diagnosis of diabetes type II. Any states fasting BGs range at 160 and post prandial range at 125 at night by bedtime. She notes occasional feelings of low blood sugars, only about 2-3 times in the past few months. Last A1c was 8.6. She has been working on intensive lifestyle modifications including diet, exercise, and weight loss to help control her blood glucose levels.  Vitamin D Deficiency Erin [name] has a diagnosis of vitamin D deficiency. She is currently taking prescription Vit D. She notes fatigue and denies nausea, vomiting or muscle weakness.  ASSESSMENT AND PLAN:  Type 2 diabetes mellitus without complication, without long-term current use of insulin (HCC)  Vitamin D deficiency - Plan: Vitamin D, Ergocalciferol, (DRISDOL) 1.25 MG (50000 UT) CAPS capsule  Class 3 severe obesity with serious comorbidity and body mass index (BMI) of 45.0 to  49.9 in adult, unspecified obesity type ([place])  PLAN:  Diabetes II with Hyperglycemia Erin [name] has been given extensive diabetes education by myself today including ideal fasting and post-prandial blood glucose readings, individual ideal Hgb A1c goals and hypoglycemia prevention. We discussed the importance of good blood sugar control to decrease the likelihood of diabetic complications such as nephropathy, neuropathy, limb loss, blindness, coronary artery disease, and death. We discussed the importance of intensive lifestyle modification including diet, exercise and weight loss as the first line treatment for diabetes. Erin [name] is to discuss Rybelsus with her primary care physician tomorrow. We will order labs at next appointment. Erin [name] agrees to follow up with our clinic in 2 weeks.  Vitamin D Deficiency Erin [name] was informed that low vitamin D levels contributes to fatigue and are associated with obesity, breast, and colon cancer. Erin [name] agrees to continue taking prescription Vit D @50 ,000 IU every week #4 and we will refill for 1 month. She will follow up for routine testing of vitamin D, at least 2-3 times per year. She was informed of the risk of over-replacement of vitamin D and agrees to not increase her dose unless she discusses this with [name] first. We will order labs at next appointment. Erin [name] agrees to follow up with our clinic in 2 weeks.  Obesity Erin [name] is currently in the action stage of change. As such, her goal is to continue with weight loss efforts She has agreed to follow the Category 3 plan Erin [name] has been instructed to work up to a goal of  150 minutes of combined cardio and strengthening exercise per week for weight loss and overall health benefits. We discussed the following Behavioral Modification Strategies today: increasing lean protein intake, increasing vegetables and work on meal planning and easy cooking plans, better snacking choices, and planning for success   Erin [name] has  agreed to follow up with our clinic in 2 weeks. She was informed of the importance of frequent follow up visits to maximize her success with intensive lifestyle modifications for her multiple health conditions.  ALLERGIES: No Known Allergies  MEDICATIONS: Current Outpatient Medications on File Prior to Visit  Medication Sig Dispense Refill  . aspirin 81 MG tablet Take 81 mg by mouth daily.    . calcium acetate (PHOSLO) 667 MG capsule Take 667 mg by mouth 1 day or 1 dose.    . Cholecalciferol (VITAMIN D-3 PO) Take 1,000 Units by mouth daily.     [name] glipiZIDE (GLUCOTROL XL) 10 MG 24 hr tablet Take 10 mg by mouth daily.  5  . ibuprofen (ADVIL,MOTRIN) 800 MG tablet TAKE 1 TABLET(S) BY MOUTH 2 TIMES A DAY AS NEEDED [PRN]  3  . losartan (COZAAR) 50 MG tablet Take 50 mg by mouth daily.  5  . metFORMIN (GLUCOPHAGE-XR) 500 MG 24 hr tablet Take 1,000 mg by mouth 2 (two) times daily.  5  . metoprolol tartrate (LOPRESSOR) 25 MG tablet Take 1 tablet (25 mg total) by mouth 2 (two) times daily. 60 tablet 11  . Multiple Vitamin (MULTIVITAMIN) capsule Take 1 capsule by mouth daily.    . rosuvastatin (CRESTOR) 10 MG tablet Take 1 tablet (10 mg total) by mouth daily. 90 tablet 3  . vitamin C (ASCORBIC ACID) 500 MG tablet Take 500 mg by mouth daily.     No current facility-administered medications on file prior to visit.     PAST MEDICAL HISTORY: Past Medical History:  Diagnosis Date  . Anemia   . Atypical chest pain 06/13/2016  . Back pain   . Breast nodule 2009   Right   . Diabetes mellitus   . Fibroid    Symptomatic  . H/O dysmenorrhea   . H/O hematuria   . H/O hypercholesterolemia   . H/O: menorrhagia   . H/O: obesity   . HPV in female   . Hypertension   . Microhematuria 2012  . Shortness of breath 06/13/2016  . Yeast vaginitis 2008    PAST SURGICAL HISTORY: Past Surgical History:  Procedure Laterality Date  . ABDOMINAL HYSTERECTOMY    . HYSTEROSCOPY W/D&C  2003  . POLYPECTOMY  2008   . TUBAL LIGATION  1984   Bilateral    SOCIAL HISTORY: Social History   Tobacco Use  . Smoking status: Never Smoker  . Smokeless tobacco: Never Used  Substance Use Topics  . Alcohol use: No  . Drug use: No    FAMILY HISTORY: Family History  Problem Relation Age of Onset  . Diabetes Mother   . Valvular heart disease Mother   . Diabetes Brother   . Hypertension Father   . Stroke Father     ROS: Review of Systems  Constitutional: Positive for malaise/fatigue and weight loss.  Gastrointestinal: Negative for nausea and vomiting.  Musculoskeletal:       Negative muscle weakness  Endo/Heme/Allergies:       Negative hypoglycemia    PHYSICAL EXAM: Pt in no acute distress  RECENT LABS AND TESTS: BMET    Component Value Date/Time   NA 141 07/15/2018 1022  NA 141 12/20/2016 1003   K 4.4 07/15/2018 1022   K 4.1 12/20/2016 1003   CL 101 07/15/2018 1022   CO2 26 07/15/2018 1022   CO2 28 12/20/2016 1003   GLUCOSE 145 (H) 07/15/2018 1022   GLUCOSE 139 12/20/2016 1003   BUN 9 07/15/2018 1022   BUN 10.1 12/20/2016 1003   CREATININE 0.49 (L) 07/15/2018 1022   CREATININE 0.7 12/20/2016 1003   CALCIUM 9.7 07/15/2018 1022   CALCIUM 9.5 12/20/2016 1003   GFRNONAA 109 07/15/2018 1022   GFRAA 125 07/15/2018 1022   Lab Results  Component Value Date   HGBA1C 8.6 (H) 07/16/2018   Lab Results  Component Value Date   INSULIN 24.2 07/16/2018   CBC    Component Value Date/Time   WBC 7.4 07/16/2018 1235   WBC 8.4 01/15/2018 0954   WBC 7.2 12/20/2016 1003   RBC 4.52 07/16/2018 1235   RBC 4.22 01/15/2018 0954   RBC 4.22 01/15/2018 0954   HGB 11.1 07/16/2018 1235   HGB 10.9 (L) 12/20/2016 1003   HCT 35.5 07/16/2018 1235   HCT 34.8 12/20/2016 1003   PLT 312 01/15/2018 0954   PLT 317 12/20/2016 1003   MCV 79 07/16/2018 1235   MCV 80.6 12/20/2016 1003   MCH 24.6 (L) 07/16/2018 1235   MCH 25.6 01/15/2018 0954   MCHC 31.3 (L) 07/16/2018 1235   MCHC 31.2 (L) 01/15/2018  0954   RDW 14.1 07/16/2018 1235   RDW 14.9 (H) 12/20/2016 1003   LYMPHSABS 2.2 07/16/2018 1235   LYMPHSABS 2.0 12/20/2016 1003   MONOABS 0.5 01/15/2018 0954   MONOABS 0.5 12/20/2016 1003   EOSABS 0.2 07/16/2018 1235   BASOSABS 0.0 07/16/2018 1235   BASOSABS 0.0 12/20/2016 1003   Iron/TIBC/Ferritin/ %Sat    Component Value Date/Time   IRON 38 (L) 12/20/2016 1003   TIBC 244 12/20/2016 1003   FERRITIN 590 (H) 12/20/2016 1003   IRONPCTSAT 16 (L) 12/20/2016 1003   Lipid Panel     Component Value Date/Time   CHOL 151 07/16/2018 1235   TRIG 110 07/16/2018 1235   HDL 50 07/16/2018 1235   LDLCALC 79 07/16/2018 1235   Hepatic Function Panel     Component Value Date/Time   PROT 7.0 12/20/2016 1003   ALBUMIN 3.7 12/20/2016 1003   AST 15 12/20/2016 1003   ALT 13 12/20/2016 1003   ALKPHOS 87 12/20/2016 1003   BILITOT 0.49 12/20/2016 1003      Component Value Date/Time   TSH 3.690 07/16/2018 1235   TSH 5.22 (H) 06/13/2016 0918   TSH 1.960 03/08/2015 1257      I, Trixie Dredge, am acting as transcriptionist for Ilene Qua, MD  I have reviewed the above documentation for accuracy and completeness, and I agree with the above. - Ilene Qua, MD

## 2018-12-21 DIAGNOSIS — E119 Type 2 diabetes mellitus without complications: Secondary | ICD-10-CM | POA: Diagnosis not present

## 2018-12-21 DIAGNOSIS — E785 Hyperlipidemia, unspecified: Secondary | ICD-10-CM | POA: Diagnosis not present

## 2018-12-21 DIAGNOSIS — D638 Anemia in other chronic diseases classified elsewhere: Secondary | ICD-10-CM | POA: Diagnosis not present

## 2018-12-21 DIAGNOSIS — I10 Essential (primary) hypertension: Secondary | ICD-10-CM | POA: Diagnosis not present

## 2018-12-29 ENCOUNTER — Encounter (INDEPENDENT_AMBULATORY_CARE_PROVIDER_SITE_OTHER): Payer: Self-pay | Admitting: Family Medicine

## 2018-12-29 ENCOUNTER — Other Ambulatory Visit: Payer: Self-pay

## 2018-12-29 ENCOUNTER — Encounter (INDEPENDENT_AMBULATORY_CARE_PROVIDER_SITE_OTHER): Payer: Self-pay

## 2018-12-29 ENCOUNTER — Ambulatory Visit (INDEPENDENT_AMBULATORY_CARE_PROVIDER_SITE_OTHER): Payer: 59 | Admitting: Family Medicine

## 2018-12-29 DIAGNOSIS — E119 Type 2 diabetes mellitus without complications: Secondary | ICD-10-CM | POA: Diagnosis not present

## 2018-12-29 DIAGNOSIS — E559 Vitamin D deficiency, unspecified: Secondary | ICD-10-CM | POA: Diagnosis not present

## 2018-12-29 DIAGNOSIS — Z6841 Body Mass Index (BMI) 40.0 and over, adult: Secondary | ICD-10-CM

## 2018-12-29 MED ORDER — SEMAGLUTIDE 3 MG PO TABS: 3.0000 mg | ORAL_TABLET | Freq: Every day | ORAL | 0 refills | Status: DC

## 2018-12-29 MED ORDER — VITAMIN D (ERGOCALCIFEROL) 1.25 MG (50000 UNIT) PO CAPS: 50000.0000 [IU] | ORAL_CAPSULE | ORAL | 0 refills | Status: DC

## 2018-12-30 ENCOUNTER — Telehealth: Payer: Self-pay | Admitting: Hematology

## 2018-12-30 NOTE — Telephone Encounter (Signed)
Per sch msg, called to see if patient would like phone visit or to reschedule. No answer, rescheduled for 1 mth, left msg.

## 2019-01-01 DIAGNOSIS — E119 Type 2 diabetes mellitus without complications: Secondary | ICD-10-CM | POA: Diagnosis not present

## 2019-01-01 DIAGNOSIS — E1165 Type 2 diabetes mellitus with hyperglycemia: Secondary | ICD-10-CM | POA: Diagnosis not present

## 2019-01-03 NOTE — Progress Notes (Signed)
Office: [phone]  /  Fax: [phone] TeleHealth Visit:  Erin [name] has verbally consented to this TeleHealth visit today. The patient is located at home, the provider is located at the [facility] office. The participants in this visit include the listed provider and patient. Erin [name] was unable to use realtime audiovisual technology today and the telehealth visit was conducted via telephone.   HPI:   Chief Complaint: OBESITY Erin [name] is here to discuss her progress with her obesity treatment plan. She is on the Category 3 plan and is following her eating plan approximately 60 % of the time. She states she is exercising 0 minutes 0 times per week. Erin [name] voices the last few weeks were fairly busy. She is still working 7 days and occasionally eating out secondary to not having food prepared.  We were unable to weigh the patient today for this TeleHealth visit. She feels as if she has maintained her weight since her last visit. She has lost 4 lbs since starting treatment with Erin [name].  Vitamin D Deficiency Erin [name] has a diagnosis of vitamin D deficiency. She is currently taking prescription Vit D. She notes fatigue and denies nausea, vomiting or muscle weakness.  Diabetes II with Hyperglycemia Gaytha has a diagnosis of diabetes type II. Erin [name] states BGs range in 170's in the morning. She denies any hypoglycemic episodes. She is open to trying Rybelsus. She has been working on intensive lifestyle modifications including diet, exercise, and weight loss to help control her blood glucose levels.  ASSESSMENT AND PLAN:  Vitamin D deficiency - Plan: Vitamin D, Ergocalciferol, (DRISDOL) 1.25 MG (50000 UT) CAPS capsule  Type 2 diabetes mellitus without complication, without long-term current use of insulin (HCC) - Plan: Semaglutide (RYBELSUS) 3 MG TABS  Class 3 severe obesity with serious comorbidity and body mass index (BMI) of 45.0 to 49.9 in adult, unspecified obesity type ([place])   PLAN:  Vitamin D Deficiency Analyce was informed that low vitamin D levels contributes to fatigue and are associated with obesity, breast, and colon cancer. Erin [name] agrees to continue taking prescription Vit D @50 ,000 IU every week #4 and we will refill for 1 month. She will follow up for routine testing of vitamin D, at least 2-3 times per year. She was informed of the risk of over-replacement of vitamin D and agrees to not increase her dose unless she discusses this with Erin [name] first. Erin [name] agrees to follow up with our clinic in 2 weeks.  Diabetes II with Hyperglycemia Erin [name] has been given extensive diabetes education by myself today including ideal fasting and post-prandial blood glucose readings, individual ideal Hgb A1c goals and hypoglycemia prevention. We discussed the importance of good blood sugar control to decrease the likelihood of diabetic complications such as nephropathy, neuropathy, limb loss, blindness, coronary artery disease, and death. We discussed the importance of intensive lifestyle modification including diet, exercise and weight loss as the first line treatment for diabetes. Erin [name] agrees to start Rybelsus 3 mg PO q AM #30 with no refills, She is to stop glipizide when starting Rybelsus. Erin [name] agrees to follow up with our clinic in 2 weeks.  Obesity Erin [name] is currently in the action stage of change. As such, her goal is to continue with weight loss efforts She has agreed to follow the Category 3 plan  Erin [name] is to meal prep more food to have more food readily available. Erin [name] has been instructed to work up to a goal of 150 minutes of combined cardio and strengthening  exercise per week for weight loss and overall health benefits. We discussed the following Behavioral Modification Strategies today: increasing lean protein intake, decrease eating out and avoiding temptations, keeping healthy foods in the home, better snacking choices, and planning for success   Erin [name] has agreed  to follow up with our clinic in 2 weeks. She was informed of the importance of frequent follow up visits to maximize her success with intensive lifestyle modifications for her multiple health conditions.  ALLERGIES: No Known Allergies  MEDICATIONS: Current Outpatient Medications on File Prior to Visit  Medication Sig Dispense Refill  . aspirin 81 MG tablet Take 81 mg by mouth daily.    . calcium acetate (PHOSLO) 667 MG capsule Take 667 mg by mouth 1 day or 1 dose.    . Cholecalciferol (VITAMIN D-3 PO) Take 1,000 Units by mouth daily.     Erin [name] ibuprofen (ADVIL,MOTRIN) 800 MG tablet TAKE 1 TABLET(S) BY MOUTH 2 TIMES A DAY AS NEEDED [PRN]  3  . losartan (COZAAR) 50 MG tablet Take 50 mg by mouth daily.  5  . metFORMIN (GLUCOPHAGE-XR) 500 MG 24 hr tablet Take 1,000 mg by mouth 2 (two) times daily.  5  . metoprolol tartrate (LOPRESSOR) 25 MG tablet Take 1 tablet (25 mg total) by mouth 2 (two) times daily. 60 tablet 11  . Multiple Vitamin (MULTIVITAMIN) capsule Take 1 capsule by mouth daily.    . rosuvastatin (CRESTOR) 10 MG tablet Take 1 tablet (10 mg total) by mouth daily. 90 tablet 3  . vitamin C (ASCORBIC ACID) 500 MG tablet Take 500 mg by mouth daily.     No current facility-administered medications on file prior to visit.     PAST MEDICAL HISTORY: Past Medical History:  Diagnosis Date  . Anemia   . Atypical chest pain 06/13/2016  . Back pain   . Breast nodule 2009   Right   . Diabetes mellitus   . Fibroid    Symptomatic  . H/O dysmenorrhea   . H/O hematuria   . H/O hypercholesterolemia   . H/O: menorrhagia   . H/O: obesity   . HPV in female   . Hypertension   . Microhematuria 2012  . Shortness of breath 06/13/2016  . Yeast vaginitis 2008    PAST SURGICAL HISTORY: Past Surgical History:  Procedure Laterality Date  . ABDOMINAL HYSTERECTOMY    . HYSTEROSCOPY W/D&C  2003  . POLYPECTOMY  2008  . TUBAL LIGATION  1984   Bilateral    SOCIAL HISTORY: Social History    Tobacco Use  . Smoking status: Never Smoker  . Smokeless tobacco: Never Used  Substance Use Topics  . Alcohol use: No  . Drug use: No    FAMILY HISTORY: Family History  Problem Relation Age of Onset  . Diabetes Mother   . Valvular heart disease Mother   . Diabetes Brother   . Hypertension Father   . Stroke Father     ROS: Review of Systems  Constitutional: Positive for malaise/fatigue. Negative for weight loss.  Gastrointestinal: Negative for nausea and vomiting.  Musculoskeletal:       Negative muscle weakness  Endo/Heme/Allergies:       Negative hypoglycemia    PHYSICAL EXAM: Pt in no acute distress  RECENT LABS AND TESTS: BMET    Component Value Date/Time   NA 141 07/15/2018 1022   NA 141 12/20/2016 1003   K 4.4 07/15/2018 1022   K 4.1 12/20/2016 1003   CL 101 07/15/2018  1022   CO2 26 07/15/2018 1022   CO2 28 12/20/2016 1003   GLUCOSE 145 (H) 07/15/2018 1022   GLUCOSE 139 12/20/2016 1003   BUN 9 07/15/2018 1022   BUN 10.1 12/20/2016 1003   CREATININE 0.49 (L) 07/15/2018 1022   CREATININE 0.7 12/20/2016 1003   CALCIUM 9.7 07/15/2018 1022   CALCIUM 9.5 12/20/2016 1003   GFRNONAA 109 07/15/2018 1022   GFRAA 125 07/15/2018 1022   Lab Results  Component Value Date   HGBA1C 8.6 (H) 07/16/2018   Lab Results  Component Value Date   INSULIN 24.2 07/16/2018   CBC    Component Value Date/Time   WBC 7.4 07/16/2018 1235   WBC 8.4 01/15/2018 0954   WBC 7.2 12/20/2016 1003   RBC 4.52 07/16/2018 1235   RBC 4.22 01/15/2018 0954   RBC 4.22 01/15/2018 0954   HGB 11.1 07/16/2018 1235   HGB 10.9 (L) 12/20/2016 1003   HCT 35.5 07/16/2018 1235   HCT 34.8 12/20/2016 1003   PLT 312 01/15/2018 0954   PLT 317 12/20/2016 1003   MCV 79 07/16/2018 1235   MCV 80.6 12/20/2016 1003   MCH 24.6 (L) 07/16/2018 1235   MCH 25.6 01/15/2018 0954   MCHC 31.3 (L) 07/16/2018 1235   MCHC 31.2 (L) 01/15/2018 0954   RDW 14.1 07/16/2018 1235   RDW 14.9 (H) 12/20/2016 1003    LYMPHSABS 2.2 07/16/2018 1235   LYMPHSABS 2.0 12/20/2016 1003   MONOABS 0.5 01/15/2018 0954   MONOABS 0.5 12/20/2016 1003   EOSABS 0.2 07/16/2018 1235   BASOSABS 0.0 07/16/2018 1235   BASOSABS 0.0 12/20/2016 1003   Iron/TIBC/Ferritin/ %Sat    Component Value Date/Time   IRON 38 (L) 12/20/2016 1003   TIBC 244 12/20/2016 1003   FERRITIN 590 (H) 12/20/2016 1003   IRONPCTSAT 16 (L) 12/20/2016 1003   Lipid Panel     Component Value Date/Time   CHOL 151 07/16/2018 1235   TRIG 110 07/16/2018 1235   HDL 50 07/16/2018 1235   LDLCALC 79 07/16/2018 1235   Hepatic Function Panel     Component Value Date/Time   PROT 7.0 12/20/2016 1003   ALBUMIN 3.7 12/20/2016 1003   AST 15 12/20/2016 1003   ALT 13 12/20/2016 1003   ALKPHOS 87 12/20/2016 1003   BILITOT 0.49 12/20/2016 1003      Component Value Date/Time   TSH 3.690 07/16/2018 1235   TSH 5.22 (H) 06/13/2016 0918   TSH 1.960 03/08/2015 1257      I, Trixie Dredge, am acting as transcriptionist for Ilene Qua, MD  I have reviewed the above documentation for accuracy and completeness, and I agree with the above. - Ilene Qua, MD

## 2019-01-08 ENCOUNTER — Other Ambulatory Visit (INDEPENDENT_AMBULATORY_CARE_PROVIDER_SITE_OTHER): Payer: Self-pay | Admitting: Family Medicine

## 2019-01-08 DIAGNOSIS — E559 Vitamin D deficiency, unspecified: Secondary | ICD-10-CM

## 2019-01-11 ENCOUNTER — Telehealth: Payer: Self-pay | Admitting: Orthopaedic Surgery

## 2019-01-11 NOTE — Telephone Encounter (Signed)
Tyler called inquiring status of request for records that was faxed in March. I informed we didn't have. She will re fax to [phone]

## 2019-01-12 ENCOUNTER — Other Ambulatory Visit: Payer: Self-pay

## 2019-01-12 ENCOUNTER — Ambulatory Visit (INDEPENDENT_AMBULATORY_CARE_PROVIDER_SITE_OTHER): Payer: 59 | Admitting: Family Medicine

## 2019-01-12 ENCOUNTER — Encounter (INDEPENDENT_AMBULATORY_CARE_PROVIDER_SITE_OTHER): Payer: Self-pay | Admitting: Family Medicine

## 2019-01-12 DIAGNOSIS — Z6841 Body Mass Index (BMI) 40.0 and over, adult: Secondary | ICD-10-CM

## 2019-01-12 DIAGNOSIS — E1165 Type 2 diabetes mellitus with hyperglycemia: Secondary | ICD-10-CM

## 2019-01-12 DIAGNOSIS — I10 Essential (primary) hypertension: Secondary | ICD-10-CM

## 2019-01-12 NOTE — Progress Notes (Signed)
Office: [phone]  /  Fax: [phone] TeleHealth Visit:  Erin [name] has verbally consented to this TeleHealth visit today. The patient is located at home, the provider is located at the [employer] [facility] office. The participants in this visit include the listed provider and patient. Alicen was unable to use realtime audiovisual technology today and the telehealth visit was conducted via telephone.   HPI:   Chief Complaint: OBESITY Erin [name] is here to discuss her progress with her obesity treatment plan. She is on the Category 3 plan and is following her eating plan approximately 65 % of the time. She states she is walking for 30 minutes 5 times per week. Erin [name]'s weight is of 323 lbs. She is occasionally eating off the plan in terms of eating takeout. She is trying to make smart choices when eating out.  We were unable to weigh the patient today for this TeleHealth visit. She feels as if she has maintained her weight since her last visit. She has lost 4 lbs since starting treatment with [name].  Diabetes II with Hyperglycemia Mally has a diagnosis of diabetes type II. Erin [name] hasn't started Rybelsus yet. She states fasting BGs range between 150 and 160. She is on metformin 1,000 mg BID. She denies hypoglycemia. Last A1c was 8.6. She has been working on intensive lifestyle modifications including diet, exercise, and weight loss to help control her blood glucose levels.  Hypertension KRISHAUNA SCHATZMAN is a 58 y.o. female with hypertension. Lashona's blood pressure was controlled at work (120/70's). She denies chest pain, chest pressure, or headaches. She is working on weight loss to help control her blood pressure with the goal of decreasing her risk of heart attack and stroke.   ASSESSMENT AND PLAN:  Type 2 diabetes mellitus with hyperglycemia, without long-term current use of insulin (HCC)  Essential hypertension  Class 3 severe obesity with serious comorbidity and body mass index  (BMI) of 45.0 to 49.9 in adult, unspecified obesity type ([place])  PLAN:  Diabetes II with Hyperglycemia Erin [name] has been given extensive diabetes education by myself today including ideal fasting and post-prandial blood glucose readings, individual ideal Hgb A1c goals and hypoglycemia prevention. We discussed the importance of good blood sugar control to decrease the likelihood of diabetic complications such as nephropathy, neuropathy, limb loss, blindness, coronary artery disease, and death. We discussed the importance of intensive lifestyle modification including diet, exercise and weight loss as the first line treatment for diabetes. Erin [name] agrees to continue her diabetes medications, and she is to start Rybelsus when she is able to get it filled. Erin [name] agrees to follow up with our clinic in 2 weeks.  Hypertension We discussed sodium restriction, working on healthy weight loss, and a regular exercise program as the means to achieve improved blood pressure control. Erin [name] agreed with this plan and agreed to follow up as directed. We will continue to monitor her blood pressure as well as her progress with the above lifestyle modifications. Erin [name] agrees to continue taking Losartan, no change in dose and will watch for signs of hypotension as she continues her lifestyle modifications. Erin [name] agrees to follow up with our clinic in 2 weeks.  Obesity Erin [name] is currently in the action stage of change. As such, her goal is to continue with weight loss efforts She has agreed to follow the Category 3 plan Erin [name] has been instructed to work up to a goal of 150 minutes of combined cardio and strengthening exercise per week for weight loss  and overall health benefits. We discussed the following Behavioral Modification Strategies today: increasing lean protein intake, increasing vegetables and work on meal planning and easy cooking plans, keeping healthy foods in the home, and planning for success   Erin [name] has  agreed to follow up with our clinic in 2 weeks. She was informed of the importance of frequent follow up visits to maximize her success with intensive lifestyle modifications for her multiple health conditions.  ALLERGIES: No Known Allergies  MEDICATIONS: Current Outpatient Medications on File Prior to Visit  Medication Sig Dispense Refill  . aspirin 81 MG tablet Take 81 mg by mouth daily.    . calcium acetate (PHOSLO) 667 MG capsule Take 667 mg by mouth 1 day or 1 dose.    . Cholecalciferol (VITAMIN D-3 PO) Take 1,000 Units by mouth daily.     [name] ibuprofen (ADVIL,MOTRIN) 800 MG tablet TAKE 1 TABLET(S) BY MOUTH 2 TIMES A DAY AS NEEDED [PRN]  3  . losartan (COZAAR) 50 MG tablet Take 50 mg by mouth daily.  5  . metFORMIN (GLUCOPHAGE-XR) 500 MG 24 hr tablet Take 1,000 mg by mouth 2 (two) times daily.  5  . metoprolol tartrate (LOPRESSOR) 25 MG tablet Take 1 tablet (25 mg total) by mouth 2 (two) times daily. 60 tablet 11  . Multiple Vitamin (MULTIVITAMIN) capsule Take 1 capsule by mouth daily.    . rosuvastatin (CRESTOR) 10 MG tablet Take 1 tablet (10 mg total) by mouth daily. 90 tablet 3  . Semaglutide (RYBELSUS) 3 MG TABS Take 3 mg by mouth daily. 30 tablet 0  . vitamin C (ASCORBIC ACID) 500 MG tablet Take 500 mg by mouth daily.    . Vitamin D, Ergocalciferol, (DRISDOL) 1.25 MG (50000 UT) CAPS capsule Take 1 capsule (50,000 Units total) by mouth every 7 (seven) days. 4 capsule 0   No current facility-administered medications on file prior to visit.     PAST MEDICAL HISTORY: Past Medical History:  Diagnosis Date  . Anemia   . Atypical chest pain 06/13/2016  . Back pain   . Breast nodule 2009   Right   . Diabetes mellitus   . Fibroid    Symptomatic  . H/O dysmenorrhea   . H/O hematuria   . H/O hypercholesterolemia   . H/O: menorrhagia   . H/O: obesity   . HPV in female   . Hypertension   . Microhematuria 2012  . Shortness of breath 06/13/2016  . Yeast vaginitis 2008    PAST  SURGICAL HISTORY: Past Surgical History:  Procedure Laterality Date  . ABDOMINAL HYSTERECTOMY    . HYSTEROSCOPY W/D&C  2003  . POLYPECTOMY  2008  . TUBAL LIGATION  1984   Bilateral    SOCIAL HISTORY: Social History   Tobacco Use  . Smoking status: Never Smoker  . Smokeless tobacco: Never Used  Substance Use Topics  . Alcohol use: No  . Drug use: No    FAMILY HISTORY: Family History  Problem Relation Age of Onset  . Diabetes Mother   . Valvular heart disease Mother   . Diabetes Brother   . Hypertension Father   . Stroke Father     ROS: Review of Systems  Constitutional: Negative for weight loss.  Cardiovascular: Negative for chest pain.       Negative chest pressure  Neurological: Negative for headaches.  Endo/Heme/Allergies:       Negative hypoglycemia    PHYSICAL EXAM: Pt in no acute distress  RECENT LABS  AND TESTS: BMET    Component Value Date/Time   NA 141 07/15/2018 1022   NA 141 12/20/2016 1003   K 4.4 07/15/2018 1022   K 4.1 12/20/2016 1003   CL 101 07/15/2018 1022   CO2 26 07/15/2018 1022   CO2 28 12/20/2016 1003   GLUCOSE 145 (H) 07/15/2018 1022   GLUCOSE 139 12/20/2016 1003   BUN 9 07/15/2018 1022   BUN 10.1 12/20/2016 1003   CREATININE 0.49 (L) 07/15/2018 1022   CREATININE 0.7 12/20/2016 1003   CALCIUM 9.7 07/15/2018 1022   CALCIUM 9.5 12/20/2016 1003   GFRNONAA 109 07/15/2018 1022   GFRAA 125 07/15/2018 1022   Lab Results  Component Value Date   HGBA1C 8.6 (H) 07/16/2018   Lab Results  Component Value Date   INSULIN 24.2 07/16/2018   CBC    Component Value Date/Time   WBC 7.4 07/16/2018 1235   WBC 8.4 01/15/2018 0954   WBC 7.2 12/20/2016 1003   RBC 4.52 07/16/2018 1235   RBC 4.22 01/15/2018 0954   RBC 4.22 01/15/2018 0954   HGB 11.1 07/16/2018 1235   HGB 10.9 (L) 12/20/2016 1003   HCT 35.5 07/16/2018 1235   HCT 34.8 12/20/2016 1003   PLT 312 01/15/2018 0954   PLT 317 12/20/2016 1003   MCV 79 07/16/2018 1235   MCV  80.6 12/20/2016 1003   MCH 24.6 (L) 07/16/2018 1235   MCH 25.6 01/15/2018 0954   MCHC 31.3 (L) 07/16/2018 1235   MCHC 31.2 (L) 01/15/2018 0954   RDW 14.1 07/16/2018 1235   RDW 14.9 (H) 12/20/2016 1003   LYMPHSABS 2.2 07/16/2018 1235   LYMPHSABS 2.0 12/20/2016 1003   MONOABS 0.5 01/15/2018 0954   MONOABS 0.5 12/20/2016 1003   EOSABS 0.2 07/16/2018 1235   BASOSABS 0.0 07/16/2018 1235   BASOSABS 0.0 12/20/2016 1003   Iron/TIBC/Ferritin/ %Sat    Component Value Date/Time   IRON 38 (L) 12/20/2016 1003   TIBC 244 12/20/2016 1003   FERRITIN 590 (H) 12/20/2016 1003   IRONPCTSAT 16 (L) 12/20/2016 1003   Lipid Panel     Component Value Date/Time   CHOL 151 07/16/2018 1235   TRIG 110 07/16/2018 1235   HDL 50 07/16/2018 1235   LDLCALC 79 07/16/2018 1235   Hepatic Function Panel     Component Value Date/Time   PROT 7.0 12/20/2016 1003   ALBUMIN 3.7 12/20/2016 1003   AST 15 12/20/2016 1003   ALT 13 12/20/2016 1003   ALKPHOS 87 12/20/2016 1003   BILITOT 0.49 12/20/2016 1003      Component Value Date/Time   TSH 3.690 07/16/2018 1235   TSH 5.22 (H) 06/13/2016 0918   TSH 1.960 03/08/2015 1257      I, Trixie Dredge, am acting as transcriptionist for Ilene Qua, MD   I have reviewed the above documentation for accuracy and completeness, and I agree with the above. - Ilene Qua, MD

## 2019-01-15 ENCOUNTER — Other Ambulatory Visit: Payer: 59

## 2019-01-15 ENCOUNTER — Ambulatory Visit: Payer: 59 | Admitting: Hematology

## 2019-01-19 ENCOUNTER — Other Ambulatory Visit: Payer: Self-pay | Admitting: Cardiovascular Disease

## 2019-01-26 ENCOUNTER — Ambulatory Visit (INDEPENDENT_AMBULATORY_CARE_PROVIDER_SITE_OTHER): Payer: 59 | Admitting: Family Medicine

## 2019-01-26 ENCOUNTER — Other Ambulatory Visit: Payer: Self-pay

## 2019-01-26 DIAGNOSIS — E559 Vitamin D deficiency, unspecified: Secondary | ICD-10-CM

## 2019-01-26 DIAGNOSIS — E119 Type 2 diabetes mellitus without complications: Secondary | ICD-10-CM | POA: Diagnosis not present

## 2019-01-26 DIAGNOSIS — Z6841 Body Mass Index (BMI) 40.0 and over, adult: Secondary | ICD-10-CM

## 2019-01-26 MED ORDER — VITAMIN D (ERGOCALCIFEROL) 1.25 MG (50000 UNIT) PO CAPS: 50000.0000 [IU] | ORAL_CAPSULE | ORAL | 0 refills | Status: DC

## 2019-01-26 NOTE — Progress Notes (Signed)
Office: [phone]  /  Fax: [phone] TeleHealth Visit:  Erin [name] has verbally consented to this TeleHealth visit today. The patient is located at home, the provider is located at the [facility] office. The participants in this visit include the listed provider and patient. The visit was conducted today via audio.  HPI:   Chief Complaint: OBESITY Erin [name] is here to discuss her progress with her obesity treatment plan. She is on the Category 3 plan and is following her eating plan approximately 75 % of the time. She states she is walking for 30 minutes 3 times per week. Erin [name] weighed earlier this week and had lost 3 lbs. She denies hunger. She states someone at her work tested positive for coronavirus. She find dinner meal to be the most difficult secondary to fatigue and lack of planning.  We were unable to weigh the patient today for this TeleHealth visit. She feels as if she has lost weight since her last visit. She has lost 4-7 lbs since starting treatment with [name].  Vitamin D Deficiency Erin [name] has a diagnosis of vitamin D deficiency. She is currently taking prescription Vit D. She notes fatigue and denies nausea, vomiting or muscle weakness.  Diabetes II with Hyperglycemia Erin [name] has a diagnosis of diabetes type II. Erin [name] states she is starting Rybelsus this weekend secondary to being off work this weekend. Last A1c was 8.6. She denies hypoglycemia. She has been working on intensive lifestyle modifications including diet, exercise, and weight loss to help control her blood glucose levels.  ASSESSMENT AND PLAN:  Vitamin D deficiency - Plan: Vitamin D, Ergocalciferol, (DRISDOL) 1.25 MG (50000 UT) CAPS capsule  Type 2 diabetes mellitus without complication, without long-term current use of insulin (HCC)  Class 3 severe obesity with serious comorbidity and body mass index (BMI) of 45.0 to 49.9 in adult, unspecified obesity type ([place])  PLAN:  Vitamin D  Deficiency Erin [name] was informed that low vitamin D levels contributes to fatigue and are associated with obesity, breast, and colon cancer. Erin [name] agrees to continue taking prescription Vit D @50 ,000 IU every week #4 and we will refill for 1 month. She will follow up for routine testing of vitamin D, at least 2-3 times per year. She was informed of the risk of over-replacement of vitamin D and agrees to not increase her dose unless she discusses this with [name] first. Erin [name] agrees to follow up with our clinic in 2 weeks.  Diabetes II with Hyperglycemia Erin [name] has been given extensive diabetes education by myself today including ideal fasting and post-prandial blood glucose readings, individual ideal Hgb A1c goals and hypoglycemia prevention. We discussed the importance of good blood sugar control to decrease the likelihood of diabetic complications such as nephropathy, neuropathy, limb loss, blindness, coronary artery disease, and death. We discussed the importance of intensive lifestyle modification including diet, exercise and weight loss as the first line treatment for diabetes. Erin [name] agrees to continue her diabetes medications and we will follow up at her next appointment about Rybelsus. Erin [name] agrees to follow up with our clinic in 2 weeks.  Obesity Erin [name] is currently in the action stage of change. As such, her goal is to continue with weight loss efforts She has agreed to follow the Category 3 plan Erin [name] has been instructed to work up to a goal of 150 minutes of combined cardio and strengthening exercise per week for weight loss and overall health benefits. We discussed the following Behavioral Modification Strategies today: increasing lean  protein intake, increasing vegetables and work on meal planning and easy cooking plans, keeping healthy foods in the home, and planning for success   Erin [name] has agreed to follow up with our clinic in 2 weeks. She was informed of the importance of frequent  follow up visits to maximize her success with intensive lifestyle modifications for her multiple health conditions.  ALLERGIES: No Known Allergies  MEDICATIONS: Current Outpatient Medications on File Prior to Visit  Medication Sig Dispense Refill  . aspirin 81 MG tablet Take 81 mg by mouth daily.    . calcium acetate (PHOSLO) 667 MG capsule Take 667 mg by mouth 1 day or 1 dose.    . Cholecalciferol (VITAMIN D-3 PO) Take 1,000 Units by mouth daily.     [name] ibuprofen (ADVIL,MOTRIN) 800 MG tablet TAKE 1 TABLET(S) BY MOUTH 2 TIMES A DAY AS NEEDED [PRN]  3  . losartan (COZAAR) 50 MG tablet Take 50 mg by mouth daily.  5  . metFORMIN (GLUCOPHAGE-XR) 500 MG 24 hr tablet Take 1,000 mg by mouth 2 (two) times daily.  5  . metoprolol tartrate (LOPRESSOR) 25 MG tablet TAKE 1 TABLET BY MOUTH TWICE A DAY 180 tablet 1  . Multiple Vitamin (MULTIVITAMIN) capsule Take 1 capsule by mouth daily.    . rosuvastatin (CRESTOR) 10 MG tablet Take 1 tablet (10 mg total) by mouth daily. 90 tablet 3  . Semaglutide (RYBELSUS) 3 MG TABS Take 3 mg by mouth daily. 30 tablet 0  . vitamin C (ASCORBIC ACID) 500 MG tablet Take 500 mg by mouth daily.     No current facility-administered medications on file prior to visit.     PAST MEDICAL HISTORY: Past Medical History:  Diagnosis Date  . Anemia   . Atypical chest pain 06/13/2016  . Back pain   . Breast nodule 2009   Right   . Diabetes mellitus   . Fibroid    Symptomatic  . H/O dysmenorrhea   . H/O hematuria   . H/O hypercholesterolemia   . H/O: menorrhagia   . H/O: obesity   . HPV in female   . Hypertension   . Microhematuria 2012  . Shortness of breath 06/13/2016  . Yeast vaginitis 2008    PAST SURGICAL HISTORY: Past Surgical History:  Procedure Laterality Date  . ABDOMINAL HYSTERECTOMY    . HYSTEROSCOPY W/D&C  2003  . POLYPECTOMY  2008  . TUBAL LIGATION  1984   Bilateral    SOCIAL HISTORY: Social History   Tobacco Use  . Smoking status: Never  Smoker  . Smokeless tobacco: Never Used  Substance Use Topics  . Alcohol use: No  . Drug use: No    FAMILY HISTORY: Family History  Problem Relation Age of Onset  . Diabetes Mother   . Valvular heart disease Mother   . Diabetes Brother   . Hypertension Father   . Stroke Father     ROS: Review of Systems  Constitutional: Positive for malaise/fatigue and weight loss.  Gastrointestinal: Negative for nausea and vomiting.  Musculoskeletal:       Negative muscle weakness  Endo/Heme/Allergies:       Negative hypoglycemia    PHYSICAL EXAM: Pt in no acute distress  RECENT LABS AND TESTS: BMET    Component Value Date/Time   NA 141 07/15/2018 1022   NA 141 12/20/2016 1003   K 4.4 07/15/2018 1022   K 4.1 12/20/2016 1003   CL 101 07/15/2018 1022   CO2 26 07/15/2018 1022  CO2 28 12/20/2016 1003   GLUCOSE 145 (H) 07/15/2018 1022   GLUCOSE 139 12/20/2016 1003   BUN 9 07/15/2018 1022   BUN 10.1 12/20/2016 1003   CREATININE 0.49 (L) 07/15/2018 1022   CREATININE 0.7 12/20/2016 1003   CALCIUM 9.7 07/15/2018 1022   CALCIUM 9.5 12/20/2016 1003   GFRNONAA 109 07/15/2018 1022   GFRAA 125 07/15/2018 1022   Lab Results  Component Value Date   HGBA1C 8.6 (H) 07/16/2018   Lab Results  Component Value Date   INSULIN 24.2 07/16/2018   CBC    Component Value Date/Time   WBC 7.4 07/16/2018 1235   WBC 8.4 01/15/2018 0954   WBC 7.2 12/20/2016 1003   RBC 4.52 07/16/2018 1235   RBC 4.22 01/15/2018 0954   RBC 4.22 01/15/2018 0954   HGB 11.1 07/16/2018 1235   HGB 10.9 (L) 12/20/2016 1003   HCT 35.5 07/16/2018 1235   HCT 34.8 12/20/2016 1003   PLT 312 01/15/2018 0954   PLT 317 12/20/2016 1003   MCV 79 07/16/2018 1235   MCV 80.6 12/20/2016 1003   MCH 24.6 (L) 07/16/2018 1235   MCH 25.6 01/15/2018 0954   MCHC 31.3 (L) 07/16/2018 1235   MCHC 31.2 (L) 01/15/2018 0954   RDW 14.1 07/16/2018 1235   RDW 14.9 (H) 12/20/2016 1003   LYMPHSABS 2.2 07/16/2018 1235   LYMPHSABS 2.0  12/20/2016 1003   MONOABS 0.5 01/15/2018 0954   MONOABS 0.5 12/20/2016 1003   EOSABS 0.2 07/16/2018 1235   BASOSABS 0.0 07/16/2018 1235   BASOSABS 0.0 12/20/2016 1003   Iron/TIBC/Ferritin/ %Sat    Component Value Date/Time   IRON 38 (L) 12/20/2016 1003   TIBC 244 12/20/2016 1003   FERRITIN 590 (H) 12/20/2016 1003   IRONPCTSAT 16 (L) 12/20/2016 1003   Lipid Panel     Component Value Date/Time   CHOL 151 07/16/2018 1235   TRIG 110 07/16/2018 1235   HDL 50 07/16/2018 1235   LDLCALC 79 07/16/2018 1235   Hepatic Function Panel     Component Value Date/Time   PROT 7.0 12/20/2016 1003   ALBUMIN 3.7 12/20/2016 1003   AST 15 12/20/2016 1003   ALT 13 12/20/2016 1003   ALKPHOS 87 12/20/2016 1003   BILITOT 0.49 12/20/2016 1003      Component Value Date/Time   TSH 3.690 07/16/2018 1235   TSH 5.22 (H) 06/13/2016 0918   TSH 1.960 03/08/2015 1257      I, Trixie Dredge, am acting as transcriptionist for Ilene Qua, MD   I have reviewed the above documentation for accuracy and completeness, and I agree with the above. - Ilene Qua, MD

## 2019-01-27 ENCOUNTER — Encounter (INDEPENDENT_AMBULATORY_CARE_PROVIDER_SITE_OTHER): Payer: Self-pay | Admitting: Family Medicine

## 2019-02-07 ENCOUNTER — Other Ambulatory Visit (INDEPENDENT_AMBULATORY_CARE_PROVIDER_SITE_OTHER): Payer: Self-pay | Admitting: Family Medicine

## 2019-02-07 DIAGNOSIS — E559 Vitamin D deficiency, unspecified: Secondary | ICD-10-CM

## 2019-02-09 ENCOUNTER — Ambulatory Visit (INDEPENDENT_AMBULATORY_CARE_PROVIDER_SITE_OTHER): Payer: 59 | Admitting: Family Medicine

## 2019-02-09 ENCOUNTER — Encounter (INDEPENDENT_AMBULATORY_CARE_PROVIDER_SITE_OTHER): Payer: Self-pay | Admitting: Family Medicine

## 2019-02-09 ENCOUNTER — Other Ambulatory Visit: Payer: Self-pay

## 2019-02-09 DIAGNOSIS — I10 Essential (primary) hypertension: Secondary | ICD-10-CM

## 2019-02-09 DIAGNOSIS — E1165 Type 2 diabetes mellitus with hyperglycemia: Secondary | ICD-10-CM | POA: Diagnosis not present

## 2019-02-09 DIAGNOSIS — Z6841 Body Mass Index (BMI) 40.0 and over, adult: Secondary | ICD-10-CM | POA: Diagnosis not present

## 2019-02-09 MED ORDER — SEMAGLUTIDE 7 MG PO TABS: 7.0000 mg | ORAL_TABLET | Freq: Every day | ORAL | 0 refills | Status: DC

## 2019-02-09 NOTE — Progress Notes (Signed)
Office: [phone]  /  Fax: [phone] TeleHealth Visit:  Erin [name] has verbally consented to this TeleHealth visit today. The patient is located at home, the provider is located at the [facility] office. The participants in this visit include the listed provider and patient. The visit was conducted today via audio.  HPI:   Chief Complaint: OBESITY Erin [name] is here to discuss her progress with her obesity treatment plan. She is on the Category 3 plan and is following her eating plan approximately 80 % of the time. She states she is walking at work 7 times per week. [name] is doing a good amount of meal prep and planning, which has been making it easier to follow the plan. She is only occasionally stopping at a drive thru 2 times a week.  We were unable to weigh the patient today for this TeleHealth visit. She feels as if she has lost 9 lbs since her last visit. She has lost 4-7 lbs since starting treatment with [name].  Diabetes II with Hyperglycemia Erin [name] has a diagnosis of diabetes type II. Erin [name] states fasting BGs average at 200 (normally between 160 and 170). She denies hypoglycemia episodes. She denies GI side effects of Rybelsus. Last A1c was 8.6. She has been working on intensive lifestyle modifications including diet, exercise, and weight loss to help control her blood glucose levels.  Hypertension [name] is a 58 y.o. female with hypertension. [name]'s blood pressure was previously controlled. She denies chest pain, chest pressure, and headaches. She is working on weight loss to help control her blood pressure with the goal of decreasing her risk of heart attack and stroke.   ASSESSMENT AND PLAN:  Essential hypertension  Type 2 diabetes mellitus with hyperglycemia, without long-term current use of insulin (HCC) - Plan: Semaglutide (RYBELSUS) 7 MG TABS  Class 3 severe obesity with serious comorbidity and body mass index (BMI) of 45.0 to 49.9 in adult,  unspecified obesity type ([place])  PLAN:  Diabetes II with Hyperglycemia Erin [name] has been given extensive diabetes education by myself today including ideal fasting and post-prandial blood glucose readings, individual ideal Hgb A1c goals and hypoglycemia prevention. We discussed the importance of good blood sugar control to decrease the likelihood of diabetic complications such as nephropathy, neuropathy, limb loss, blindness, coronary artery disease, and death. We discussed the importance of intensive lifestyle modification including diet, exercise and weight loss as the first line treatment for diabetes. Erin [name] agrees to continue taking Rybelsus 7 mg PO q AM #30 and we will refill for 1 month. Erin [name] agrees to follow up with our clinic in 2 weeks.  Hypertension We discussed sodium restriction, working on healthy weight loss, and a regular exercise program as the means to achieve improved blood pressure control. Erin [name] agreed with this plan and agreed to follow up as directed. We will continue to monitor her blood pressure as well as her progress with the above lifestyle modifications. Erin [name] agrees to continue her current medications and will watch for signs of hypotension as she continues her lifestyle modifications. Erin [name] agrees to follow up with our clinic in 2 weeks.  Obesity Erin [name] is currently in the action stage of change. As such, her goal is to continue with weight loss efforts She has agreed to follow the Category 3 plan Erin [name] has been instructed to work up to a goal of 150 minutes of combined cardio and strengthening exercise per week for weight loss and overall health benefits. We discussed the  following Behavioral Modification Strategies today: increasing lean protein intake, increasing vegetables and work on meal planning and easy cooking plans, keeping healthy foods in the home, better snacking choices, and planning for success   Erin [name] has agreed to follow up with our clinic in 2  weeks. She was informed of the importance of frequent follow up visits to maximize her success with intensive lifestyle modifications for her multiple health conditions.  ALLERGIES: No Known Allergies  MEDICATIONS: Current Outpatient Medications on File Prior to Visit  Medication Sig Dispense Refill  . aspirin 81 MG tablet Take 81 mg by mouth daily.    . calcium acetate (PHOSLO) 667 MG capsule Take 667 mg by mouth 1 day or 1 dose.    . Cholecalciferol (VITAMIN D-3 PO) Take 1,000 Units by mouth daily.     [name] ibuprofen (ADVIL,MOTRIN) 800 MG tablet TAKE 1 TABLET(S) BY MOUTH 2 TIMES A DAY AS NEEDED [PRN]  3  . losartan (COZAAR) 50 MG tablet Take 50 mg by mouth daily.  5  . metFORMIN (GLUCOPHAGE-XR) 500 MG 24 hr tablet Take 1,000 mg by mouth 2 (two) times daily.  5  . metoprolol tartrate (LOPRESSOR) 25 MG tablet TAKE 1 TABLET BY MOUTH TWICE A DAY 180 tablet 1  . Multiple Vitamin (MULTIVITAMIN) capsule Take 1 capsule by mouth daily.    . rosuvastatin (CRESTOR) 10 MG tablet Take 1 tablet (10 mg total) by mouth daily. 90 tablet 3  . vitamin C (ASCORBIC ACID) 500 MG tablet Take 500 mg by mouth daily.    . Vitamin D, Ergocalciferol, (DRISDOL) 1.25 MG (50000 UT) CAPS capsule Take 1 capsule (50,000 Units total) by mouth every 7 (seven) days. 4 capsule 0   No current facility-administered medications on file prior to visit.     PAST MEDICAL HISTORY: Past Medical History:  Diagnosis Date  . Anemia   . Atypical chest pain 06/13/2016  . Back pain   . Breast nodule 2009   Right   . Diabetes mellitus   . Fibroid    Symptomatic  . H/O dysmenorrhea   . H/O hematuria   . H/O hypercholesterolemia   . H/O: menorrhagia   . H/O: obesity   . HPV in female   . Hypertension   . Microhematuria 2012  . Shortness of breath 06/13/2016  . Yeast vaginitis 2008    PAST SURGICAL HISTORY: Past Surgical History:  Procedure Laterality Date  . ABDOMINAL HYSTERECTOMY    . HYSTEROSCOPY W/D&C  2003  .  POLYPECTOMY  2008  . TUBAL LIGATION  1984   Bilateral    SOCIAL HISTORY: Social History   Tobacco Use  . Smoking status: Never Smoker  . Smokeless tobacco: Never Used  Substance Use Topics  . Alcohol use: No  . Drug use: No    FAMILY HISTORY: Family History  Problem Relation Age of Onset  . Diabetes Mother   . Valvular heart disease Mother   . Diabetes Brother   . Hypertension Father   . Stroke Father     ROS: Review of Systems  Constitutional: Positive for weight loss.  Cardiovascular: Negative for chest pain.       Negative chest pressure  Neurological: Negative for headaches.  Endo/Heme/Allergies:       Negative hypoglycemia    PHYSICAL EXAM: Pt in no acute distress  RECENT LABS AND TESTS: BMET    Component Value Date/Time   NA 141 07/15/2018 1022   NA 141 12/20/2016 1003   K  4.4 07/15/2018 1022   K 4.1 12/20/2016 1003   CL 101 07/15/2018 1022   CO2 26 07/15/2018 1022   CO2 28 12/20/2016 1003   GLUCOSE 145 (H) 07/15/2018 1022   GLUCOSE 139 12/20/2016 1003   BUN 9 07/15/2018 1022   BUN 10.1 12/20/2016 1003   CREATININE 0.49 (L) 07/15/2018 1022   CREATININE 0.7 12/20/2016 1003   CALCIUM 9.7 07/15/2018 1022   CALCIUM 9.5 12/20/2016 1003   GFRNONAA 109 07/15/2018 1022   GFRAA 125 07/15/2018 1022   Lab Results  Component Value Date   HGBA1C 8.6 (H) 07/16/2018   Lab Results  Component Value Date   INSULIN 24.2 07/16/2018   CBC    Component Value Date/Time   WBC 7.4 07/16/2018 1235   WBC 8.4 01/15/2018 0954   WBC 7.2 12/20/2016 1003   RBC 4.52 07/16/2018 1235   RBC 4.22 01/15/2018 0954   RBC 4.22 01/15/2018 0954   HGB 11.1 07/16/2018 1235   HGB 10.9 (L) 12/20/2016 1003   HCT 35.5 07/16/2018 1235   HCT 34.8 12/20/2016 1003   PLT 312 01/15/2018 0954   PLT 317 12/20/2016 1003   MCV 79 07/16/2018 1235   MCV 80.6 12/20/2016 1003   MCH 24.6 (L) 07/16/2018 1235   MCH 25.6 01/15/2018 0954   MCHC 31.3 (L) 07/16/2018 1235   MCHC 31.2 (L)  01/15/2018 0954   RDW 14.1 07/16/2018 1235   RDW 14.9 (H) 12/20/2016 1003   LYMPHSABS 2.2 07/16/2018 1235   LYMPHSABS 2.0 12/20/2016 1003   MONOABS 0.5 01/15/2018 0954   MONOABS 0.5 12/20/2016 1003   EOSABS 0.2 07/16/2018 1235   BASOSABS 0.0 07/16/2018 1235   BASOSABS 0.0 12/20/2016 1003   Iron/TIBC/Ferritin/ %Sat    Component Value Date/Time   IRON 38 (L) 12/20/2016 1003   TIBC 244 12/20/2016 1003   FERRITIN 590 (H) 12/20/2016 1003   IRONPCTSAT 16 (L) 12/20/2016 1003   Lipid Panel     Component Value Date/Time   CHOL 151 07/16/2018 1235   TRIG 110 07/16/2018 1235   HDL 50 07/16/2018 1235   LDLCALC 79 07/16/2018 1235   Hepatic Function Panel     Component Value Date/Time   PROT 7.0 12/20/2016 1003   ALBUMIN 3.7 12/20/2016 1003   AST 15 12/20/2016 1003   ALT 13 12/20/2016 1003   ALKPHOS 87 12/20/2016 1003   BILITOT 0.49 12/20/2016 1003      Component Value Date/Time   TSH 3.690 07/16/2018 1235   TSH 5.22 (H) 06/13/2016 0918   TSH 1.960 03/08/2015 1257      I, Trixie Dredge, am acting as transcriptionist for Ilene Qua, MD  I have reviewed the above documentation for accuracy and completeness, and I agree with the above. - Ilene Qua, MD

## 2019-02-18 ENCOUNTER — Other Ambulatory Visit: Payer: Self-pay | Admitting: *Deleted

## 2019-02-18 DIAGNOSIS — D638 Anemia in other chronic diseases classified elsewhere: Secondary | ICD-10-CM

## 2019-02-22 ENCOUNTER — Inpatient Hospital Stay: Payer: 59 | Attending: Hematology

## 2019-02-22 ENCOUNTER — Inpatient Hospital Stay: Payer: 59 | Admitting: Hematology

## 2019-02-25 ENCOUNTER — Ambulatory Visit (INDEPENDENT_AMBULATORY_CARE_PROVIDER_SITE_OTHER): Payer: 59 | Admitting: Family Medicine

## 2019-02-25 ENCOUNTER — Other Ambulatory Visit: Payer: Self-pay

## 2019-02-25 ENCOUNTER — Encounter (INDEPENDENT_AMBULATORY_CARE_PROVIDER_SITE_OTHER): Payer: Self-pay | Admitting: Family Medicine

## 2019-02-25 DIAGNOSIS — E1165 Type 2 diabetes mellitus with hyperglycemia: Secondary | ICD-10-CM

## 2019-02-25 DIAGNOSIS — Z6841 Body Mass Index (BMI) 40.0 and over, adult: Secondary | ICD-10-CM | POA: Diagnosis not present

## 2019-02-25 DIAGNOSIS — I10 Essential (primary) hypertension: Secondary | ICD-10-CM | POA: Diagnosis not present

## 2019-02-26 ENCOUNTER — Ambulatory Visit: Payer: 59 | Admitting: Orthopaedic Surgery

## 2019-02-28 NOTE — Progress Notes (Signed)
Office: [phone]  /  Fax: [phone] TeleHealth Visit:  Erin [name] has verbally consented to this TeleHealth visit today. The patient is located at home, the provider is located at the [employer] [facility] office. The participants in this visit include the listed provider and patient. Erin [name] was unable to use realtime audiovisual technology today and the telehealth visit was conducted via telephone (time of 14 minutes).  HPI:   Chief Complaint: OBESITY Erin [name] is here to discuss her progress with her obesity treatment plan. She is on the  follow the Category 3 plan and is following her eating plan approximately 80 % of the time. She states she is exercising 0 minutes 0 times per week. Erin [name] has been preparing food 2-3 days in advance in order to better stay on meal plan.  We were unable to weigh the patient today for this TeleHealth visit. She feels as if she has maintained weight since her last visit. She has lost 4-7 lbs since starting treatment with Erin [name].  Diabetes II Avanell has a diagnosis of diabetes type II. Fredia states fasting BGs 160 and denies any hypoglycemic episodes. Last A1c was 8.6. She has been working on intensive lifestyle modifications including diet, exercise, and weight loss to help control her blood glucose levels. Denies GI side effects of Rybelsus. She has not started 7 mg of Rybelsus yet.   Hypertension [name] is a 58 y.o. female with hypertension.  [name] denies chest pain/pressure, headache,  or shortness of breath on exertion. She is working weight loss to help control her blood pressure with the goal of decreasing her risk of heart attack and stroke. Sharons is not checking blood pressure.     ASSESSMENT AND PLAN:  Type 2 diabetes mellitus with hyperglycemia, without long-term current use of insulin (HCC)  Essential hypertension  Class 3 severe obesity with serious comorbidity and body mass index (BMI) of 45.0 to 49.9 in adult,  unspecified obesity type ([place])  PLAN:  Obesity Erin [name] is currently in the action stage of change. As such, her goal is to continue with weight loss efforts She has agreed to follow the Category 3 plan Erin [name] has been instructed to work up to a goal of 150 minutes of combined cardio and strengthening exercise per week for weight loss and overall health benefits. We discussed the following Behavioral Modification Stratagies today: keeping healthy foods in the home, planning for success,  increasing lean protein intake, increasing vegetables and work on meal planning and easy cooking plans   Erin [name] has agreed to follow up with our clinic in 2 weeks. She was informed of the importance of frequent follow up visits to maximize her success with intensive lifestyle modifications for her multiple health conditions.  ALLERGIES: No Known Allergies  MEDICATIONS: Current Outpatient Medications on File Prior to Visit  Medication Sig Dispense Refill  . aspirin 81 MG tablet Take 81 mg by mouth daily.    . calcium acetate (PHOSLO) 667 MG capsule Take 667 mg by mouth 1 day or 1 dose.    . Cholecalciferol (VITAMIN D-3 PO) Take 1,000 Units by mouth daily.     [name] ibuprofen (ADVIL,MOTRIN) 800 MG tablet TAKE 1 TABLET(S) BY MOUTH 2 TIMES A DAY AS NEEDED [PRN]  3  . losartan (COZAAR) 50 MG tablet Take 50 mg by mouth daily.  5  . metFORMIN (GLUCOPHAGE-XR) 500 MG 24 hr tablet Take 1,000 mg by mouth 2 (two) times daily.  5  .  metoprolol tartrate (LOPRESSOR) 25 MG tablet TAKE 1 TABLET BY MOUTH TWICE A DAY 180 tablet 1  . Multiple Vitamin (MULTIVITAMIN) capsule Take 1 capsule by mouth daily.    . rosuvastatin (CRESTOR) 10 MG tablet Take 1 tablet (10 mg total) by mouth daily. 90 tablet 3  . Semaglutide (RYBELSUS) 7 MG TABS Take 7 mg by mouth daily. 30 tablet 0  . vitamin C (ASCORBIC ACID) 500 MG tablet Take 500 mg by mouth daily.    . Vitamin D, Ergocalciferol, (DRISDOL) 1.25 MG (50000 UT) CAPS capsule Take 1 capsule  (50,000 Units total) by mouth every 7 (seven) days. 4 capsule 0   No current facility-administered medications on file prior to visit.     PAST MEDICAL HISTORY: Past Medical History:  Diagnosis Date  . Anemia   . Atypical chest pain 06/13/2016  . Back pain   . Breast nodule 2009   Right   . Diabetes mellitus   . Fibroid    Symptomatic  . H/O dysmenorrhea   . H/O hematuria   . H/O hypercholesterolemia   . H/O: menorrhagia   . H/O: obesity   . HPV in female   . Hypertension   . Microhematuria 2012  . Shortness of breath 06/13/2016  . Yeast vaginitis 2008    PAST SURGICAL HISTORY: Past Surgical History:  Procedure Laterality Date  . ABDOMINAL HYSTERECTOMY    . HYSTEROSCOPY W/D&C  2003  . POLYPECTOMY  2008  . TUBAL LIGATION  1984   Bilateral    SOCIAL HISTORY: Social History   Tobacco Use  . Smoking status: Never Smoker  . Smokeless tobacco: Never Used  Substance Use Topics  . Alcohol use: No  . Drug use: No    FAMILY HISTORY: Family History  Problem Relation Age of Onset  . Diabetes Mother   . Valvular heart disease Mother   . Diabetes Brother   . Hypertension Father   . Stroke Father     ROS: Review of Systems  Respiratory: Negative for shortness of breath.   Cardiovascular: Negative for chest pain.  Gastrointestinal: Negative for diarrhea, nausea and vomiting.  Neurological: Negative for headaches.  Endo/Heme/Allergies:       Negative for hypoglycemia    PHYSICAL EXAM: Pt in no acute distress  RECENT LABS AND TESTS: BMET    Component Value Date/Time   NA 141 07/15/2018 1022   NA 141 12/20/2016 1003   K 4.4 07/15/2018 1022   K 4.1 12/20/2016 1003   CL 101 07/15/2018 1022   CO2 26 07/15/2018 1022   CO2 28 12/20/2016 1003   GLUCOSE 145 (H) 07/15/2018 1022   GLUCOSE 139 12/20/2016 1003   BUN 9 07/15/2018 1022   BUN 10.1 12/20/2016 1003   CREATININE 0.49 (L) 07/15/2018 1022   CREATININE 0.7 12/20/2016 1003   CALCIUM 9.7 07/15/2018 1022    CALCIUM 9.5 12/20/2016 1003   GFRNONAA 109 07/15/2018 1022   GFRAA 125 07/15/2018 1022   Lab Results  Component Value Date   HGBA1C 8.6 (H) 07/16/2018   Lab Results  Component Value Date   INSULIN 24.2 07/16/2018   CBC    Component Value Date/Time   WBC 7.4 07/16/2018 1235   WBC 8.4 01/15/2018 0954   WBC 7.2 12/20/2016 1003   RBC 4.52 07/16/2018 1235   RBC 4.22 01/15/2018 0954   RBC 4.22 01/15/2018 0954   HGB 11.1 07/16/2018 1235   HGB 10.9 (L) 12/20/2016 1003   HCT 35.5 07/16/2018 1235  HCT 34.8 12/20/2016 1003   PLT 312 01/15/2018 0954   PLT 317 12/20/2016 1003   MCV 79 07/16/2018 1235   MCV 80.6 12/20/2016 1003   MCH 24.6 (L) 07/16/2018 1235   MCH 25.6 01/15/2018 0954   MCHC 31.3 (L) 07/16/2018 1235   MCHC 31.2 (L) 01/15/2018 0954   RDW 14.1 07/16/2018 1235   RDW 14.9 (H) 12/20/2016 1003   LYMPHSABS 2.2 07/16/2018 1235   LYMPHSABS 2.0 12/20/2016 1003   MONOABS 0.5 01/15/2018 0954   MONOABS 0.5 12/20/2016 1003   EOSABS 0.2 07/16/2018 1235   BASOSABS 0.0 07/16/2018 1235   BASOSABS 0.0 12/20/2016 1003   Iron/TIBC/Ferritin/ %Sat    Component Value Date/Time   IRON 38 (L) 12/20/2016 1003   TIBC 244 12/20/2016 1003   FERRITIN 590 (H) 12/20/2016 1003   IRONPCTSAT 16 (L) 12/20/2016 1003   Lipid Panel     Component Value Date/Time   CHOL 151 07/16/2018 1235   TRIG 110 07/16/2018 1235   HDL 50 07/16/2018 1235   LDLCALC 79 07/16/2018 1235   Hepatic Function Panel     Component Value Date/Time   PROT 7.0 12/20/2016 1003   ALBUMIN 3.7 12/20/2016 1003   AST 15 12/20/2016 1003   ALT 13 12/20/2016 1003   ALKPHOS 87 12/20/2016 1003   BILITOT 0.49 12/20/2016 1003      Component Value Date/Time   TSH 3.690 07/16/2018 1235   TSH 5.22 (H) 06/13/2016 0918   TSH 1.960 03/08/2015 1257      I, Renee Ramus, am acting as Location manager for Ilene Qua, MD    I have reviewed the above documentation for accuracy and completeness, and I agree with  the above. - Ilene Qua, MD

## 2019-03-05 ENCOUNTER — Other Ambulatory Visit (INDEPENDENT_AMBULATORY_CARE_PROVIDER_SITE_OTHER): Payer: Self-pay | Admitting: Family Medicine

## 2019-03-05 DIAGNOSIS — E1165 Type 2 diabetes mellitus with hyperglycemia: Secondary | ICD-10-CM

## 2019-03-10 ENCOUNTER — Ambulatory Visit (INDEPENDENT_AMBULATORY_CARE_PROVIDER_SITE_OTHER): Payer: Worker's Compensation | Admitting: Orthopaedic Surgery

## 2019-03-10 ENCOUNTER — Other Ambulatory Visit: Payer: Self-pay

## 2019-03-10 ENCOUNTER — Telehealth: Payer: Self-pay

## 2019-03-10 ENCOUNTER — Ambulatory Visit: Payer: Self-pay

## 2019-03-10 ENCOUNTER — Encounter: Payer: Self-pay | Admitting: Orthopaedic Surgery

## 2019-03-10 VITALS — Ht 69.0 in | Wt 320.0 lb

## 2019-03-10 DIAGNOSIS — M545 Low back pain, unspecified: Secondary | ICD-10-CM

## 2019-03-10 NOTE — Progress Notes (Signed)
Office Visit Note   Patient: Erin [name]           Date of Birth: 1960-09-21           MRN: [id number] Visit Date: 03/10/2019              Requested by: No referring provider defined for this encounter. PCP: Erin Austin, MD (Inactive)   Assessment & Plan: Visit Diagnoses:  1. Low back pain, unspecified back pain laterality, unspecified chronicity, unspecified whether sciatica present     Plan: Patient like to continue regular work.  Work slip given for regular work.  She needs to work on weight loss to help unload her lumbar spine where she has some disc degeneration.  She states she has recently lost some weight.  She has appointment coming up with a weight loss clinic.  At this point she does not need to be reimaged.  She can continue regular work activity and I will recheck her in 2 months.  Follow-Up Instructions: Return in about 2 months (around 05/11/2019).   Orders:  Orders Placed This Encounter  Procedures  . XR Lumbar Spine 2-3 Views   No orders of the defined types were placed in this encounter.     Procedures: No procedures performed   Clinical Data: No additional findings.   Subjective: Chief Complaint  Patient presents with  . Lower Back - Pain, Injury    HPI 58 year old female returns for Worker's Comp. visit concerning reinjured her back on 12/16 1/19.  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  She felt pain in her back that radiated into her left buttocks more of a cramping type sensation.  She is taken some ibuprofen also muscle relaxant.  Previously she was seen for on-the-job injury back in 2007 2010.  Past MRI in 2016 showed some disc degeneration at L23, disc dehydration mild bulge at L3-4 and at L4-5.  Patient states she has been going to the weight loss clinic but they have been doing tele-visits only due to COVID 19.  She does have diabetes and is trying exercise to get her A1c improved.  Patient denies any  associated bowel bladder symptoms she is continued to do regular work activity.  Currently she is working 7 days a week and states some of her job activities have increased over several years.  Review of Systems positive for diabetes, hypertension, low back pain with some disc degeneration.  Morbid obesity BMI 47.   Objective: Vital Signs: Ht 5\' 9"  (1.753 m)   Wt (!) 320 lb (145.2 kg)   BMI 47.26 kg/m   Physical Exam Constitutional:      Appearance: She is well-developed.  HENT:     Head: Normocephalic.     Right Ear: External ear normal.     Left Ear: External ear normal.  Eyes:     Pupils: Pupils are equal, round, and reactive to light.  Neck:     Thyroid: No thyromegaly.     Trachea: No tracheal deviation.  Cardiovascular:     Rate and Rhythm: Normal rate.  Pulmonary:     Effort: Pulmonary effort is normal.  Abdominal:     Palpations: Abdomen is soft.  Skin:    General: Skin is warm and dry.  Neurological:     Mental Status: She is alert and oriented to person, place, and time.  Psychiatric:  Behavior: Behavior normal.     Ortho Exam patient has some left sciatic notch tenderness some discomfort with forward bending.  She gets on and off the exam table knee and ankle jerk are intact. Negative straight leg raising 90 degrees negative logroll of the hips.  No quad weakness.  Anterior tib gastrocsoleus takes good resistance testing.  EHL is normal. Specialty Comments:  No specialty comments available.  Imaging: No results found.   PMFS History: Patient Active Problem List   Diagnosis Date Noted  . Morbid (severe) obesity due to excess calories ([place]) 11/25/2016  . Shortness of breath 06/13/2016  . Atypical chest pain 06/13/2016  . Anemia of chronic disease 03/08/2015  . High blood pressure 07/22/2012  . Diabetes mellitus ([place]) 07/22/2012   Past Medical History:  Diagnosis Date  . Anemia   . Atypical chest pain 06/13/2016  . Back pain   . Breast nodule  2009   Right   . Diabetes mellitus   . Fibroid    Symptomatic  . H/O dysmenorrhea   . H/O hematuria   . H/O hypercholesterolemia   . H/O: menorrhagia   . H/O: obesity   . HPV in female   . Hypertension   . Microhematuria 2012  . Shortness of breath 06/13/2016  . Yeast vaginitis 2008    Family History  Problem Relation Age of Onset  . Diabetes Mother   . Valvular heart disease Mother   . Diabetes Brother   . Hypertension Father   . Stroke Father     Past Surgical History:  Procedure Laterality Date  . ABDOMINAL HYSTERECTOMY    . HYSTEROSCOPY W/D&C  2003  . POLYPECTOMY  2008  . TUBAL LIGATION  1984   Bilateral   Social History   Occupational History  . Occupation: Glass blower/designer  Tobacco Use  . Smoking status: Never Smoker  . Smokeless tobacco: Never Used  Substance and Sexual Activity  . Alcohol use: No  . Drug use: No  . Sexual activity: Never    Comment: hysterectomy

## 2019-03-10 NOTE — Telephone Encounter (Signed)
Faxed todays office note and work note to wc adjustor

## 2019-03-11 ENCOUNTER — Telehealth (INDEPENDENT_AMBULATORY_CARE_PROVIDER_SITE_OTHER): Payer: 59 | Admitting: Family Medicine

## 2019-03-11 ENCOUNTER — Encounter (INDEPENDENT_AMBULATORY_CARE_PROVIDER_SITE_OTHER): Payer: Self-pay | Admitting: Family Medicine

## 2019-03-11 DIAGNOSIS — E1165 Type 2 diabetes mellitus with hyperglycemia: Secondary | ICD-10-CM | POA: Diagnosis not present

## 2019-03-11 DIAGNOSIS — E559 Vitamin D deficiency, unspecified: Secondary | ICD-10-CM | POA: Diagnosis not present

## 2019-03-11 DIAGNOSIS — Z6841 Body Mass Index (BMI) 40.0 and over, adult: Secondary | ICD-10-CM

## 2019-03-11 MED ORDER — VITAMIN D (ERGOCALCIFEROL) 1.25 MG (50000 UNIT) PO CAPS: 50000.0000 [IU] | ORAL_CAPSULE | ORAL | 0 refills | Status: DC

## 2019-03-11 NOTE — Progress Notes (Signed)
Office: [phone]  /  Fax: [phone] TeleHealth Visit:  Erin [name] has verbally consented to this TeleHealth visit today. The patient is located at home, the provider is located at the [employer] [facility] office. The participants in this visit include the listed provider and patient. Micha was unable to use realtime audiovisual technology today and the telehealth visit was conducted via audio only (13 minutes).   HPI:   Chief Complaint: OBESITY Erin [name] is here to discuss her progress with her obesity treatment plan. She is on the Category 3 plan and is following her eating plan approximately 80 % of the time. She states she is doing a lot of walking at work. Erin [name] reports a weight of 320 lbs 4 days ago. She says work has been increasingly stressful, and she has had to skip meals or go out to eat every so often.  We were unable to weigh the patient today for this TeleHealth visit. She feels as if she has lost weight since her last visit. She has lost 4-7 lbs since starting treatment with [name].  Vitamin D Deficiency Erin [name] has a diagnosis of vitamin D deficiency. She is currently taking prescription Vit D. She notes fatigue and denies nausea, vomiting or muscle weakness.  Diabetes II with Hyperglycemia Erin [name] has a diagnosis of diabetes type II. Erin [name] notes elevated blood sugars of 100 (that felt hypoglycemic). She states her BGs range around 130's, and BGs at 131 last night. She is Rybelsus 7 mg now with no GI side effects. Last A1c was 8.6. She has been working on intensive lifestyle modifications including diet, exercise, and weight loss to help control her blood glucose levels.  ASSESSMENT AND PLAN:  Vitamin D deficiency - Plan: Vitamin D, Ergocalciferol, (DRISDOL) 1.25 MG (50000 UT) CAPS capsule  Type 2 diabetes mellitus with hyperglycemia, without long-term current use of insulin (HCC)  Class 3 severe obesity with serious comorbidity and body mass index (BMI) of 45.0  to 49.9 in adult, unspecified obesity type ([place])  PLAN:  Vitamin D Deficiency Erin [name] was informed that low vitamin D levels contributes to fatigue and are associated with obesity, breast, and colon cancer. Dedria agrees to change prescription Vit D to 50,000 IU every 14 days #4 with no refills. She will follow up for routine testing of vitamin D, at least 2-3 times per year. She was informed of the risk of over-replacement of vitamin D and agrees to not increase her dose unless she discusses this with [name] first. Erin [name] agrees to follow up with our clinic in 2 weeks.  Diabetes II with Hyperglycemia Erin [name] has been given extensive diabetes education by myself today including ideal fasting and post-prandial blood glucose readings, individual ideal Hgb A1c goals and hypoglycemia prevention. We discussed the importance of good blood sugar control to decrease the likelihood of diabetic complications such as nephropathy, neuropathy, limb loss, blindness, coronary artery disease, and death. We discussed the importance of intensive lifestyle modification including diet, exercise and weight loss as the first line treatment for diabetes. Erin [name] agrees to continue taking Rybelsus, no changes in dosage. Erin [name] agrees to follow up with our clinic in 2 weeks.  Obesity Erin [name] is currently in the action stage of change. As such, her goal is to continue with weight loss efforts She has agreed to follow the Category 3 plan Erin [name] has been instructed to work up to a goal of 150 minutes of combined cardio and strengthening exercise per week for weight loss and overall health  benefits. We discussed the following Behavioral Modification Strategies today: increasing lean protein intake, increasing vegetables and work on meal planning and easy cooking plans, keeping healthy foods in the home, and planning for success   Erin [name] has agreed to follow up with our clinic in 2 weeks. She was informed of the importance of frequent  follow up visits to maximize her success with intensive lifestyle modifications for her multiple health conditions.  ALLERGIES: No Known Allergies  MEDICATIONS: Current Outpatient Medications on File Prior to Visit  Medication Sig Dispense Refill  . aspirin 81 MG tablet Take 81 mg by mouth daily.    . calcium acetate (PHOSLO) 667 MG capsule Take 667 mg by mouth 1 day or 1 dose.    . Cholecalciferol (VITAMIN D-3 PO) Take 1,000 Units by mouth daily.     [name] ibuprofen (ADVIL,MOTRIN) 800 MG tablet TAKE 1 TABLET(S) BY MOUTH 2 TIMES A DAY AS NEEDED [PRN]  3  . losartan (COZAAR) 50 MG tablet Take 50 mg by mouth daily.  5  . metFORMIN (GLUCOPHAGE-XR) 500 MG 24 hr tablet Take 1,000 mg by mouth 2 (two) times daily.  5  . metoprolol tartrate (LOPRESSOR) 25 MG tablet TAKE 1 TABLET BY MOUTH TWICE A DAY 180 tablet 1  . Multiple Vitamin (MULTIVITAMIN) capsule Take 1 capsule by mouth daily.    . rosuvastatin (CRESTOR) 10 MG tablet Take 1 tablet (10 mg total) by mouth daily. 90 tablet 3  . Semaglutide (RYBELSUS) 7 MG TABS Take 7 mg by mouth daily. 30 tablet 0  . vitamin C (ASCORBIC ACID) 500 MG tablet Take 500 mg by mouth daily.     No current facility-administered medications on file prior to visit.     PAST MEDICAL HISTORY: Past Medical History:  Diagnosis Date  . Anemia   . Atypical chest pain 06/13/2016  . Back pain   . Breast nodule 2009   Right   . Diabetes mellitus   . Fibroid    Symptomatic  . H/O dysmenorrhea   . H/O hematuria   . H/O hypercholesterolemia   . H/O: menorrhagia   . H/O: obesity   . HPV in female   . Hypertension   . Microhematuria 2012  . Shortness of breath 06/13/2016  . Yeast vaginitis 2008    PAST SURGICAL HISTORY: Past Surgical History:  Procedure Laterality Date  . ABDOMINAL HYSTERECTOMY    . HYSTEROSCOPY W/D&C  2003  . POLYPECTOMY  2008  . TUBAL LIGATION  1984   Bilateral    SOCIAL HISTORY: Social History   Tobacco Use  . Smoking status: Never  Smoker  . Smokeless tobacco: Never Used  Substance Use Topics  . Alcohol use: No  . Drug use: No    FAMILY HISTORY: Family History  Problem Relation Age of Onset  . Diabetes Mother   . Valvular heart disease Mother   . Diabetes Brother   . Hypertension Father   . Stroke Father     ROS: Review of Systems  Constitutional: Positive for malaise/fatigue and weight loss.  Gastrointestinal: Negative for nausea and vomiting.  Musculoskeletal:       Negative muscle weakness  Endo/Heme/Allergies:       Negative hypoglycemia    PHYSICAL EXAM: Pt in no acute distress  RECENT LABS AND TESTS: BMET    Component Value Date/Time   NA 141 07/15/2018 1022   NA 141 12/20/2016 1003   K 4.4 07/15/2018 1022   K 4.1 12/20/2016 1003  CL 101 07/15/2018 1022   CO2 26 07/15/2018 1022   CO2 28 12/20/2016 1003   GLUCOSE 145 (H) 07/15/2018 1022   GLUCOSE 139 12/20/2016 1003   BUN 9 07/15/2018 1022   BUN 10.1 12/20/2016 1003   CREATININE 0.49 (L) 07/15/2018 1022   CREATININE 0.7 12/20/2016 1003   CALCIUM 9.7 07/15/2018 1022   CALCIUM 9.5 12/20/2016 1003   GFRNONAA 109 07/15/2018 1022   GFRAA 125 07/15/2018 1022   Lab Results  Component Value Date   HGBA1C 8.6 (H) 07/16/2018   Lab Results  Component Value Date   INSULIN 24.2 07/16/2018   CBC    Component Value Date/Time   WBC 7.4 07/16/2018 1235   WBC 8.4 01/15/2018 0954   WBC 7.2 12/20/2016 1003   RBC 4.52 07/16/2018 1235   RBC 4.22 01/15/2018 0954   RBC 4.22 01/15/2018 0954   HGB 11.1 07/16/2018 1235   HGB 10.9 (L) 12/20/2016 1003   HCT 35.5 07/16/2018 1235   HCT 34.8 12/20/2016 1003   PLT 312 01/15/2018 0954   PLT 317 12/20/2016 1003   MCV 79 07/16/2018 1235   MCV 80.6 12/20/2016 1003   MCH 24.6 (L) 07/16/2018 1235   MCH 25.6 01/15/2018 0954   MCHC 31.3 (L) 07/16/2018 1235   MCHC 31.2 (L) 01/15/2018 0954   RDW 14.1 07/16/2018 1235   RDW 14.9 (H) 12/20/2016 1003   LYMPHSABS 2.2 07/16/2018 1235   LYMPHSABS 2.0  12/20/2016 1003   MONOABS 0.5 01/15/2018 0954   MONOABS 0.5 12/20/2016 1003   EOSABS 0.2 07/16/2018 1235   BASOSABS 0.0 07/16/2018 1235   BASOSABS 0.0 12/20/2016 1003   Iron/TIBC/Ferritin/ %Sat    Component Value Date/Time   IRON 38 (L) 12/20/2016 1003   TIBC 244 12/20/2016 1003   FERRITIN 590 (H) 12/20/2016 1003   IRONPCTSAT 16 (L) 12/20/2016 1003   Lipid Panel     Component Value Date/Time   CHOL 151 07/16/2018 1235   TRIG 110 07/16/2018 1235   HDL 50 07/16/2018 1235   LDLCALC 79 07/16/2018 1235   Hepatic Function Panel     Component Value Date/Time   PROT 7.0 12/20/2016 1003   ALBUMIN 3.7 12/20/2016 1003   AST 15 12/20/2016 1003   ALT 13 12/20/2016 1003   ALKPHOS 87 12/20/2016 1003   BILITOT 0.49 12/20/2016 1003      Component Value Date/Time   TSH 3.690 07/16/2018 1235   TSH 5.22 (H) 06/13/2016 0918   TSH 1.960 03/08/2015 1257      I, Trixie Dredge, am acting as transcriptionist for Ilene Qua, MD  I have reviewed the above documentation for accuracy and completeness, and I agree with the above. - Ilene Qua, MD

## 2019-03-12 ENCOUNTER — Other Ambulatory Visit (INDEPENDENT_AMBULATORY_CARE_PROVIDER_SITE_OTHER): Payer: Self-pay | Admitting: Family Medicine

## 2019-03-12 DIAGNOSIS — E559 Vitamin D deficiency, unspecified: Secondary | ICD-10-CM

## 2019-03-25 ENCOUNTER — Telehealth (INDEPENDENT_AMBULATORY_CARE_PROVIDER_SITE_OTHER): Payer: 59 | Admitting: Family Medicine

## 2019-03-25 ENCOUNTER — Other Ambulatory Visit: Payer: Self-pay

## 2019-03-25 ENCOUNTER — Encounter (INDEPENDENT_AMBULATORY_CARE_PROVIDER_SITE_OTHER): Payer: Self-pay | Admitting: Family Medicine

## 2019-03-25 DIAGNOSIS — E1165 Type 2 diabetes mellitus with hyperglycemia: Secondary | ICD-10-CM | POA: Diagnosis not present

## 2019-03-25 DIAGNOSIS — E559 Vitamin D deficiency, unspecified: Secondary | ICD-10-CM | POA: Diagnosis not present

## 2019-03-25 DIAGNOSIS — Z6841 Body Mass Index (BMI) 40.0 and over, adult: Secondary | ICD-10-CM | POA: Diagnosis not present

## 2019-03-25 MED ORDER — VITAMIN D (ERGOCALCIFEROL) 1.25 MG (50000 UNIT) PO CAPS: 50000.0000 [IU] | ORAL_CAPSULE | ORAL | 0 refills | Status: DC

## 2019-03-25 MED ORDER — RYBELSUS 7 MG PO TABS: 7.0000 mg | ORAL_TABLET | Freq: Every day | ORAL | 0 refills | Status: DC

## 2019-03-30 NOTE — Progress Notes (Signed)
Office: [phone]  /  Fax: [phone] TeleHealth Visit:  Erin [name] has verbally consented to this TeleHealth visit today. The patient is located at home, the provider is located at the [employer] [facility] office. The participants in this visit include the listed provider and patient and any and all parties involved. The visit was conducted today via telephone. Erin [name] was unable to use realtime audiovisual technology today and the telehealth visit was conducted via telephone.  HPI:   Chief Complaint: OBESITY Erin [name] is here to discuss her progress with her obesity treatment plan. She is on the Category 3 plan and is following her eating plan approximately 65 to 70 % of the time. She states she is exercising 0 minutes 0 times per week. Erin [name] has struggled to stay on the plan, with being on vacation for the last two weeks. She has not cooked much, and she has eaten out more. Erin [name] went to the back doctor, and the back doctor told her she needs to lose weight to help with back pain.  We were unable to weigh the patient today for this TeleHealth visit. She feels as if she has maintained weight since her last visit. She has lost 4 lbs since starting treatment with [name].  Diabetes II Rise has a diagnosis of diabetes type II. Erin [name] states fasting blood sugar was 131 this morning, and her blood sugars are ranging in the 140's. Erin [name] denies any GI side effects of Rybelsus. She has been working on intensive lifestyle modifications including diet, exercise, and weight loss to help control her blood glucose levels.  Vitamin D deficiency Erin [name] has a diagnosis of vitamin D deficiency. She is currently taking vit D. Erin [name] admits fatigue and denies nausea, vomiting or muscle weakness.  ASSESSMENT AND PLAN:  Class 3 severe obesity with serious comorbidity and body mass index (BMI) of 45.0 to 49.9 in adult, unspecified obesity type ([name])  Type 2 diabetes mellitus with hyperglycemia,  without long-term current use of insulin ([name]) - Plan: Semaglutide (RYBELSUS) 7 MG TABS  Vitamin D deficiency - Plan: Vitamin D, Ergocalciferol, (DRISDOL) 1.25 MG (50000 UT) CAPS capsule  PLAN:  Diabetes II Erin [name] has been given extensive diabetes education by myself today including ideal fasting and post-prandial blood glucose readings, individual ideal Hgb A1c goals and hypoglycemia prevention. We discussed the importance of good blood sugar control to decrease the likelihood of diabetic complications such as nephropathy, neuropathy, limb loss, blindness, coronary artery disease, and death. We discussed the importance of intensive lifestyle modification including diet, exercise and weight loss as the first line treatment for diabetes. We will check labs at the next appointment. Erin [name] agrees to continue Rybelsus 7 mg qAM #30 with no refills and follow up at the agreed upon time.  Vitamin D Deficiency Erin [name] was informed that low vitamin D levels contributes to fatigue and are associated with obesity, breast, and colon cancer. She agrees to continue to take prescription Vit D @50 ,000 IU every week #4 with no refills and will follow up for routine testing of vitamin D, at least 2-3 times per year. She was informed of the risk of over-replacement of vitamin D and agrees to not increase her dose unless she discusses this with [name] first. Erin [name] agrees to follow up as directed.  Obesity Erin [name] is currently in the action stage of change. As such, her goal is to continue with weight loss efforts She has agreed to follow the Category 3 plan Erin [name] has been instructed to  work up to a goal of 150 minutes of combined cardio and strengthening exercise per week for weight loss and overall health benefits. We discussed the following Behavioral Modification Strategies today: planning for success, keeping healthy foods in the home, better snacking choices, increasing lean protein intake, increasing vegetables and  work on meal planning and easy cooking plans  Erin [name] has agreed to follow up with our clinic in 3 weeks. She was informed of the importance of frequent follow up visits to maximize her success with intensive lifestyle modifications for her multiple health conditions.  ALLERGIES: No Known Allergies  MEDICATIONS: Current Outpatient Medications on File Prior to Visit  Medication Sig Dispense Refill   aspirin 81 MG tablet Take 81 mg by mouth daily.     calcium acetate (PHOSLO) 667 MG capsule Take 667 mg by mouth 1 day or 1 dose.     Cholecalciferol (VITAMIN D-3 PO) Take 1,000 Units by mouth daily.      ibuprofen (ADVIL,MOTRIN) 800 MG tablet TAKE 1 TABLET(S) BY MOUTH 2 TIMES A DAY AS NEEDED [PRN]  3   losartan (COZAAR) 50 MG tablet Take 50 mg by mouth daily.  5   metFORMIN (GLUCOPHAGE-XR) 500 MG 24 hr tablet Take 1,000 mg by mouth 2 (two) times daily.  5   metoprolol tartrate (LOPRESSOR) 25 MG tablet TAKE 1 TABLET BY MOUTH TWICE A DAY 180 tablet 1   Multiple Vitamin (MULTIVITAMIN) capsule Take 1 capsule by mouth daily.     rosuvastatin (CRESTOR) 10 MG tablet Take 1 tablet (10 mg total) by mouth daily. 90 tablet 3   vitamin C (ASCORBIC ACID) 500 MG tablet Take 500 mg by mouth daily.     No current facility-administered medications on file prior to visit.     PAST MEDICAL HISTORY: Past Medical History:  Diagnosis Date   Anemia    Atypical chest pain 06/13/2016   Back pain    Breast nodule 2009   Right    Diabetes mellitus    Fibroid    Symptomatic   H/O dysmenorrhea    H/O hematuria    H/O hypercholesterolemia    H/O: menorrhagia    H/O: obesity    HPV in female    Hypertension    Microhematuria 2012   Shortness of breath 06/13/2016   Yeast vaginitis 2008    PAST SURGICAL HISTORY: Past Surgical History:  Procedure Laterality Date   ABDOMINAL HYSTERECTOMY     HYSTEROSCOPY W/D&C  2003   POLYPECTOMY  2008   TUBAL LIGATION  1984   Bilateral     SOCIAL HISTORY: Social History   Tobacco Use   Smoking status: Never Smoker   Smokeless tobacco: Never Used  Substance Use Topics   Alcohol use: No   Drug use: No    FAMILY HISTORY: Family History  Problem Relation Age of Onset   Diabetes Mother    Valvular heart disease Mother    Diabetes Brother    Hypertension Father    Stroke Father     ROS: Review of Systems  Constitutional: Positive for malaise/fatigue. Negative for weight loss.  Gastrointestinal: Negative for nausea and vomiting.  Musculoskeletal:       Negative for muscle weakness    PHYSICAL EXAM: Pt in no acute distress  RECENT LABS AND TESTS: BMET    Component Value Date/Time   NA 141 07/15/2018 1022   NA 141 12/20/2016 1003   K 4.4 07/15/2018 1022   K 4.1 12/20/2016 1003  CL 101 07/15/2018 1022   CO2 26 07/15/2018 1022   CO2 28 12/20/2016 1003   GLUCOSE 145 (H) 07/15/2018 1022   GLUCOSE 139 12/20/2016 1003   BUN 9 07/15/2018 1022   BUN 10.1 12/20/2016 1003   CREATININE 0.49 (L) 07/15/2018 1022   CREATININE 0.7 12/20/2016 1003   CALCIUM 9.7 07/15/2018 1022   CALCIUM 9.5 12/20/2016 1003   GFRNONAA 109 07/15/2018 1022   GFRAA 125 07/15/2018 1022   Lab Results  Component Value Date   HGBA1C 8.6 (H) 07/16/2018   Lab Results  Component Value Date   INSULIN 24.2 07/16/2018   CBC    Component Value Date/Time   WBC 7.4 07/16/2018 1235   WBC 8.4 01/15/2018 0954   WBC 7.2 12/20/2016 1003   RBC 4.52 07/16/2018 1235   RBC 4.22 01/15/2018 0954   RBC 4.22 01/15/2018 0954   HGB 11.1 07/16/2018 1235   HGB 10.9 (L) 12/20/2016 1003   HCT 35.5 07/16/2018 1235   HCT 34.8 12/20/2016 1003   PLT 312 01/15/2018 0954   PLT 317 12/20/2016 1003   MCV 79 07/16/2018 1235   MCV 80.6 12/20/2016 1003   MCH 24.6 (L) 07/16/2018 1235   MCH 25.6 01/15/2018 0954   MCHC 31.3 (L) 07/16/2018 1235   MCHC 31.2 (L) 01/15/2018 0954   RDW 14.1 07/16/2018 1235   RDW 14.9 (H) 12/20/2016 1003    LYMPHSABS 2.2 07/16/2018 1235   LYMPHSABS 2.0 12/20/2016 1003   MONOABS 0.5 01/15/2018 0954   MONOABS 0.5 12/20/2016 1003   EOSABS 0.2 07/16/2018 1235   BASOSABS 0.0 07/16/2018 1235   BASOSABS 0.0 12/20/2016 1003   Iron/TIBC/Ferritin/ %Sat    Component Value Date/Time   IRON 38 (L) 12/20/2016 1003   TIBC 244 12/20/2016 1003   FERRITIN 590 (H) 12/20/2016 1003   IRONPCTSAT 16 (L) 12/20/2016 1003   Lipid Panel     Component Value Date/Time   CHOL 151 07/16/2018 1235   TRIG 110 07/16/2018 1235   HDL 50 07/16/2018 1235   LDLCALC 79 07/16/2018 1235   Hepatic Function Panel     Component Value Date/Time   PROT 7.0 12/20/2016 1003   ALBUMIN 3.7 12/20/2016 1003   AST 15 12/20/2016 1003   ALT 13 12/20/2016 1003   ALKPHOS 87 12/20/2016 1003   BILITOT 0.49 12/20/2016 1003      Component Value Date/Time   TSH 3.690 07/16/2018 1235   TSH 5.22 (H) 06/13/2016 0918   TSH 1.960 03/08/2015 1257     Ref. Range 07/16/2018 12:35  Vitamin D, 25-Hydroxy Latest Ref Range: 30.0 - 100.0 ng/mL 32.5    I, Doreene Nest, am acting as Location manager for Eber Jones, MD  I have reviewed the above documentation for accuracy and completeness, and I agree with the above. - Ilene Qua, MD

## 2019-04-09 ENCOUNTER — Other Ambulatory Visit (INDEPENDENT_AMBULATORY_CARE_PROVIDER_SITE_OTHER): Payer: Self-pay | Admitting: Family Medicine

## 2019-04-09 DIAGNOSIS — E559 Vitamin D deficiency, unspecified: Secondary | ICD-10-CM

## 2019-04-14 ENCOUNTER — Other Ambulatory Visit: Payer: Self-pay

## 2019-04-14 ENCOUNTER — Ambulatory Visit (INDEPENDENT_AMBULATORY_CARE_PROVIDER_SITE_OTHER): Payer: 59 | Admitting: Family Medicine

## 2019-04-14 VITALS — BP 120/69 | HR 65 | Temp 97.7°F | Ht 68.0 in | Wt 314.0 lb

## 2019-04-14 DIAGNOSIS — E559 Vitamin D deficiency, unspecified: Secondary | ICD-10-CM

## 2019-04-14 DIAGNOSIS — E1165 Type 2 diabetes mellitus with hyperglycemia: Secondary | ICD-10-CM | POA: Diagnosis not present

## 2019-04-14 DIAGNOSIS — Z9189 Other specified personal risk factors, not elsewhere classified: Secondary | ICD-10-CM | POA: Diagnosis not present

## 2019-04-14 DIAGNOSIS — Z6841 Body Mass Index (BMI) 40.0 and over, adult: Secondary | ICD-10-CM

## 2019-04-15 LAB — COMPREHENSIVE METABOLIC PANEL
ALT: 12 IU/L (ref 0–32)
AST: 14 IU/L (ref 0–40)
Albumin/Globulin Ratio: 1.7 (ref 1.2–2.2)
Albumin: 4.2 g/dL (ref 3.8–4.9)
Alkaline Phosphatase: 83 IU/L (ref 39–117)
BUN/Creatinine Ratio: 18 (ref 9–23)
BUN: 9 mg/dL (ref 6–24)
Bilirubin Total: 0.3 mg/dL (ref 0.0–1.2)
CO2: 24 mmol/L (ref 20–29)
Calcium: 9.5 mg/dL (ref 8.7–10.2)
Chloride: 99 mmol/L (ref 96–106)
Creatinine, Ser: 0.51 mg/dL — ABNORMAL LOW (ref 0.57–1.00)
GFR calc Af Amer: 123 mL/min/{1.73_m2} (ref >59)
GFR calc non Af Amer: 107 mL/min/{1.73_m2} (ref >59)
Globulin, Total: 2.5 g/dL (ref 1.5–4.5)
Glucose: 103 mg/dL — ABNORMAL HIGH (ref 65–99)
Potassium: 4.1 mmol/L (ref 3.5–5.2)
Sodium: 140 mmol/L (ref 134–144)
Total Protein: 6.7 g/dL (ref 6.0–8.5)

## 2019-04-15 LAB — LIPID PANEL WITH LDL/HDL RATIO
Cholesterol, Total: 134 mg/dL (ref 100–199)
HDL: 47 mg/dL (ref >39)
LDL Calculated: 68 mg/dL (ref 0–99)
LDl/HDL Ratio: 1.4 ratio (ref 0.0–3.2)
Triglycerides: 95 mg/dL (ref 0–149)
VLDL Cholesterol Cal: 19 mg/dL (ref 5–40)

## 2019-04-15 LAB — VITAMIN D 25 HYDROXY (VIT D DEFICIENCY, FRACTURES): Vit D, 25-Hydroxy: 47.4 ng/mL (ref 30.0–100.0)

## 2019-04-15 LAB — HEMOGLOBIN A1C
Est. average glucose Bld gHb Est-mCnc: 183 mg/dL
Hgb A1c MFr Bld: 8 % — ABNORMAL HIGH (ref 4.8–5.6)

## 2019-04-15 LAB — INSULIN, RANDOM: INSULIN: 16.3 u[IU]/mL (ref 2.6–24.9)

## 2019-04-15 LAB — VITAMIN B12: Vitamin B-12: 175 pg/mL — ABNORMAL LOW (ref 232–1245)

## 2019-04-15 NOTE — Progress Notes (Signed)
Office: [phone]  /  Fax: [phone]   HPI:   Chief Complaint: OBESITY Erin [name] is here to discuss her progress with her obesity treatment plan. Erin is on the Category 3 plan and is following her eating plan approximately 75 % of the time. Erin states Erin is walking at work for exercise. Erin [name] reports Erin has been back to work for the last two weeks. Erin is working approximately seven days most of the time. Erin [name] reports that Erin still craves ice cream, and Erin has been indulging in [place] bars at night. Erin is able to get in 6 to 7 ounces of protein at dinner. Erin is working on getting all of the meat in at all of her meals. Her weight is (!) 314 lb (142.4 kg) today and has had a weight loss of 12 pounds since her last in-office visit. Erin has lost 16 lbs since starting treatment with [name].  Diabetes II with hyperglycemia, non insulin Erin [name] has a diagnosis of diabetes type II. Erin [name] states fasting BGs range in the 130's and Erin denies any hypoglycemic episodes. Her highest blood sugar has been 152. Erin has been working on intensive lifestyle modifications including diet, exercise, and weight loss to help control her blood glucose levels.  Vitamin D deficiency Erin [name] has a diagnosis of vitamin D deficiency. Her last vitamin D level was at 32.5 Erin is currently taking vit D. Erin [name] admits to fatigue and Erin denies nausea, vomiting or muscle weakness.  At risk for osteopenia and osteoporosis Erin [name] is at higher risk of osteopenia and osteoporosis due to vitamin D deficiency.   ASSESSMENT AND PLAN:  Type 2 diabetes mellitus with hyperglycemia, without long-term current use of insulin (Erin [name]) - Plan: Comprehensive metabolic panel, Hemoglobin A1c, Insulin, random, Vitamin B12, Lipid Panel With LDL/HDL Ratio  Vitamin D deficiency - Plan: VITAMIN D 25 Hydroxy (Vit-D Deficiency, Fractures)  At risk for osteoporosis  Class 3 severe obesity with serious comorbidity and body mass index (BMI) of  45.0 to 49.9 in adult, unspecified obesity type (Erin [name])  PLAN:  Diabetes II hyperglycemia, non insulin Erin [name] has been given extensive diabetes education by myself today including ideal fasting and post-prandial blood glucose readings, individual ideal Hgb A1c goals and hypoglycemia prevention. We discussed the importance of good blood sugar control to decrease the likelihood of diabetic complications such as nephropathy, neuropathy, limb loss, blindness, coronary artery disease, and death. We discussed the importance of intensive lifestyle modification including diet, exercise and weight loss as the first line treatment for diabetes. We will check Hgb A1c, insulin level, FLP, CMP and vitamin B12 level today. Erin [name] agrees to continue Rybelsus 7 mg (no refill needed) and Erin will follow up at the agreed upon time.  Vitamin D Deficiency Erin [name] was informed that low vitamin D levels contributes to fatigue and are associated with obesity, breast, and colon cancer. Erin [name] will continue to take prescription Vit D @50 ,000 IU every week (no refill needed) and Erin will follow up for routine testing of vitamin D, at least 2-3 times per year. Erin was informed of the risk of over-replacement of vitamin D and agrees to not increase her dose unless Erin discusses this with [name] first. We will check vitamin D level today and Erin [name] will follow up as directed.  At risk for osteopenia and osteoporosis Erin [name] was given extended  (15 minutes) osteoporosis prevention counseling today. Erin [name] is at risk for osteopenia and osteoporosis due to her vitamin D deficiency. Erin was encouraged to  take her vitamin D and follow her higher calcium diet and increase strengthening exercise to help strengthen her bones and decrease her risk of osteopenia and osteoporosis.  Obesity Erin [name] is currently in the action stage of change. As such, her goal is to continue with weight loss efforts Erin has agreed to follow the Category 3  plan Erin [name] has been instructed to work up to a goal of 150 minutes of combined cardio and strengthening exercise per week for weight loss and overall health benefits. We discussed the following Behavioral Modification Strategies today: planning for success, keeping healthy foods in the home, increasing lean protein intake, increasing vegetables and work on meal planning and easy cooking plans  Erin [name] has agreed to follow up with our clinic in 2 to 3 weeks. Erin was informed of the importance of frequent follow up visits to maximize her success with intensive lifestyle modifications for her multiple health conditions.  ALLERGIES: No Known Allergies  MEDICATIONS: Current Outpatient Medications on File Prior to Visit  Medication Sig Dispense Refill   aspirin 81 MG tablet Take 81 mg by mouth daily.     calcium acetate (PHOSLO) 667 MG capsule Take 667 mg by mouth 1 day or 1 dose.     Cholecalciferol (VITAMIN D-3 PO) Take 1,000 Units by mouth daily.      ibuprofen (ADVIL,MOTRIN) 800 MG tablet TAKE 1 TABLET(S) BY MOUTH 2 TIMES A DAY AS NEEDED [PRN]  3   losartan (COZAAR) 50 MG tablet Take 50 mg by mouth daily.  5   metFORMIN (GLUCOPHAGE-XR) 500 MG 24 hr tablet Take 1,000 mg by mouth 2 (two) times daily.  5   metoprolol tartrate (LOPRESSOR) 25 MG tablet TAKE 1 TABLET BY MOUTH TWICE A DAY 180 tablet 1   Multiple Vitamin (MULTIVITAMIN) capsule Take 1 capsule by mouth daily.     rosuvastatin (CRESTOR) 10 MG tablet Take 1 tablet (10 mg total) by mouth daily. 90 tablet 3   Semaglutide (RYBELSUS) 7 MG TABS Take 7 mg by mouth daily. 30 tablet 0   vitamin C (ASCORBIC ACID) 500 MG tablet Take 500 mg by mouth daily.     Vitamin D, Ergocalciferol, (DRISDOL) 1.25 MG (50000 UT) CAPS capsule Take 1 capsule (50,000 Units total) by mouth every 14 (fourteen) days. 4 capsule 0   No current facility-administered medications on file prior to visit.     PAST MEDICAL HISTORY: Past Medical History:   Diagnosis Date   Anemia    Atypical chest pain 06/13/2016   Back pain    Breast nodule 2009   Right    Diabetes mellitus    Fibroid    Symptomatic   H/O dysmenorrhea    H/O hematuria    H/O hypercholesterolemia    H/O: menorrhagia    H/O: obesity    HPV in female    Hypertension    Microhematuria 2012   Shortness of breath 06/13/2016   Yeast vaginitis 2008    PAST SURGICAL HISTORY: Past Surgical History:  Procedure Laterality Date   ABDOMINAL HYSTERECTOMY     HYSTEROSCOPY W/D&C  2003   POLYPECTOMY  2008   TUBAL LIGATION  1984   Bilateral    SOCIAL HISTORY: Social History   Tobacco Use   Smoking status: Never Smoker   Smokeless tobacco: Never Used  Substance Use Topics   Alcohol use: No   Drug use: No    FAMILY HISTORY: Family History  Problem Relation Age of Onset   Diabetes Mother  Valvular heart disease Mother    Diabetes Brother    Hypertension Father    Stroke Father     ROS: Review of Systems  Constitutional: Positive for malaise/fatigue and weight loss.  Gastrointestinal: Negative for nausea and vomiting.  Musculoskeletal:       Negative for muscle weakness  Endo/Heme/Allergies:       Negative for hypoglycemia    PHYSICAL EXAM: Blood pressure 120/69, pulse 65, temperature 97.7 F (36.5 C), temperature source Oral, height 5\' 8"  (1.727 m), weight (!) 314 lb (142.4 kg), SpO2 99 %. Body mass index is 47.74 kg/m. Physical Exam Vitals signs reviewed.  Constitutional:      Appearance: Normal appearance. Erin is well-developed. Erin is obese.  Cardiovascular:     Rate and Rhythm: Normal rate.  Pulmonary:     Effort: Pulmonary effort is normal.  Musculoskeletal: Normal range of motion.  Skin:    General: Skin is warm and dry.  Neurological:     Mental Status: Erin is alert and oriented to person, place, and time.  Psychiatric:        Mood and Affect: Mood normal.        Behavior: Behavior normal.     RECENT  LABS AND TESTS: BMET    Component Value Date/Time   NA 141 07/15/2018 1022   NA 141 12/20/2016 1003   K 4.4 07/15/2018 1022   K 4.1 12/20/2016 1003   CL 101 07/15/2018 1022   CO2 26 07/15/2018 1022   CO2 28 12/20/2016 1003   GLUCOSE 145 (H) 07/15/2018 1022   GLUCOSE 139 12/20/2016 1003   BUN 9 07/15/2018 1022   BUN 10.1 12/20/2016 1003   CREATININE 0.49 (L) 07/15/2018 1022   CREATININE 0.7 12/20/2016 1003   CALCIUM 9.7 07/15/2018 1022   CALCIUM 9.5 12/20/2016 1003   GFRNONAA 109 07/15/2018 1022   GFRAA 125 07/15/2018 1022   Lab Results  Component Value Date   HGBA1C 8.6 (H) 07/16/2018   Lab Results  Component Value Date   INSULIN 24.2 07/16/2018   CBC    Component Value Date/Time   WBC 7.4 07/16/2018 1235   WBC 8.4 01/15/2018 0954   WBC 7.2 12/20/2016 1003   RBC 4.52 07/16/2018 1235   RBC 4.22 01/15/2018 0954   RBC 4.22 01/15/2018 0954   HGB 11.1 07/16/2018 1235   HGB 10.9 (L) 12/20/2016 1003   HCT 35.5 07/16/2018 1235   HCT 34.8 12/20/2016 1003   PLT 312 01/15/2018 0954   PLT 317 12/20/2016 1003   MCV 79 07/16/2018 1235   MCV 80.6 12/20/2016 1003   MCH 24.6 (L) 07/16/2018 1235   MCH 25.6 01/15/2018 0954   MCHC 31.3 (L) 07/16/2018 1235   MCHC 31.2 (L) 01/15/2018 0954   RDW 14.1 07/16/2018 1235   RDW 14.9 (H) 12/20/2016 1003   LYMPHSABS 2.2 07/16/2018 1235   LYMPHSABS 2.0 12/20/2016 1003   MONOABS 0.5 01/15/2018 0954   MONOABS 0.5 12/20/2016 1003   EOSABS 0.2 07/16/2018 1235   BASOSABS 0.0 07/16/2018 1235   BASOSABS 0.0 12/20/2016 1003   Iron/TIBC/Ferritin/ %Sat    Component Value Date/Time   IRON 38 (L) 12/20/2016 1003   TIBC 244 12/20/2016 1003   FERRITIN 590 (H) 12/20/2016 1003   IRONPCTSAT 16 (L) 12/20/2016 1003   Lipid Panel     Component Value Date/Time   CHOL 151 07/16/2018 1235   TRIG 110 07/16/2018 1235   HDL 50 07/16/2018 1235   LDLCALC 79 07/16/2018 1235  Hepatic Function Panel     Component Value Date/Time   PROT 7.0  12/20/2016 1003   ALBUMIN 3.7 12/20/2016 1003   AST 15 12/20/2016 1003   ALT 13 12/20/2016 1003   ALKPHOS 87 12/20/2016 1003   BILITOT 0.49 12/20/2016 1003      Component Value Date/Time   TSH 3.690 07/16/2018 1235   TSH 5.22 (H) 06/13/2016 0918   TSH 1.960 03/08/2015 1257     Ref. Range 07/16/2018 12:35  Vitamin D, 25-Hydroxy Latest Ref Range: 30.0 - 100.0 ng/mL 32.5    OBESITY BEHAVIORAL INTERVENTION VISIT  Today's visit was # 13   Starting weight: 330 lbs Starting date: 07/16/2018 Today's weight : 314 lbs Today's date: 04/14/2019 Total lbs lost to date: 16    04/14/2019  Height 5\' 8"  (1.727 m)  Weight 314 lb (142.4 kg) (A)  BMI (Calculated) 47.75  BLOOD PRESSURE - SYSTOLIC 509  BLOOD PRESSURE - DIASTOLIC 69   Body Fat % 32.6 %    ASK: We discussed the diagnosis of obesity with Erin [name] today and Erin [name] agreed to give [name] permission to discuss obesity behavioral modification therapy today.  ASSESS: Aiysha has the diagnosis of obesity and her BMI today is 47.75 Ronin is in the action stage of change   ADVISE: Madoline was educated on the multiple health risks of obesity as well as the benefit of weight loss to improve her health. Erin was advised of the need for long term treatment and the importance of lifestyle modifications to improve her current health and to decrease her risk of future health problems.  AGREE: Multiple dietary modification options and treatment options were discussed and  Keller agreed to follow the recommendations documented in the above note.  ARRANGE: Loyda was educated on the importance of frequent visits to treat obesity as outlined per CMS and USPSTF guidelines and agreed to schedule her next follow up appointment today.  I, Doreene Nest, am acting as transcriptionist for Eber Jones, MD         I have reviewed the above documentation for accuracy and completeness, and I agree with the above. - Ilene Qua, MD

## 2019-04-28 ENCOUNTER — Telehealth (INDEPENDENT_AMBULATORY_CARE_PROVIDER_SITE_OTHER): Payer: Self-pay

## 2019-04-28 DIAGNOSIS — E1165 Type 2 diabetes mellitus with hyperglycemia: Secondary | ICD-10-CM

## 2019-04-28 NOTE — Telephone Encounter (Signed)
Patient called and request refill on RX Rybelsus. Patient stated pharmacy had sent several request. [facility]

## 2019-04-29 MED ORDER — RYBELSUS 7 MG PO TABS: 7.0000 mg | ORAL_TABLET | Freq: Every day | ORAL | 0 refills | Status: DC

## 2019-05-05 ENCOUNTER — Other Ambulatory Visit: Payer: Self-pay

## 2019-05-05 ENCOUNTER — Encounter (INDEPENDENT_AMBULATORY_CARE_PROVIDER_SITE_OTHER): Payer: Self-pay | Admitting: Family Medicine

## 2019-05-05 ENCOUNTER — Telehealth (INDEPENDENT_AMBULATORY_CARE_PROVIDER_SITE_OTHER): Payer: 59 | Admitting: Family Medicine

## 2019-05-05 DIAGNOSIS — Z6841 Body Mass Index (BMI) 40.0 and over, adult: Secondary | ICD-10-CM | POA: Diagnosis not present

## 2019-05-05 DIAGNOSIS — E559 Vitamin D deficiency, unspecified: Secondary | ICD-10-CM

## 2019-05-05 DIAGNOSIS — E1165 Type 2 diabetes mellitus with hyperglycemia: Secondary | ICD-10-CM

## 2019-05-05 DIAGNOSIS — E66813 Obesity, class 3: Secondary | ICD-10-CM

## 2019-05-05 MED ORDER — RYBELSUS 7 MG PO TABS: 7.0000 mg | ORAL_TABLET | Freq: Every day | ORAL | 0 refills | Status: DC

## 2019-05-05 MED ORDER — VITAMIN D (ERGOCALCIFEROL) 1.25 MG (50000 UNIT) PO CAPS: 50000.0000 [IU] | ORAL_CAPSULE | ORAL | 0 refills | Status: DC

## 2019-05-06 NOTE — Progress Notes (Signed)
Office: [phone]  /  Fax: [phone] TeleHealth Visit:  Erin [name] has verbally consented to this TeleHealth visit today. The patient is located at home, the provider is located at the [facility] office. The participants in this visit include the listed provider and patient. The visit was conducted today via face time.  HPI:   Chief Complaint: OBESITY Erin [name] is here to discuss her progress with her obesity treatment plan. She is on the Category 3 plan and is following her eating plan approximately 30 % of the time. She states she is exercising 0 minutes 0 times per week. Erin [name] voices the last few weeks have been rough, working 12 hours a day for 7 days a week. She is often stopping and getting fast food secondary to lack of time and planning. Last reported weight was 314 lbs We were unable to weigh the patient today for this TeleHealth visit. She feels as if she has maintained her weight since her last visit. She has lost 16 lbs since starting treatment with [name].  Vitamin D Deficiency Erin [name] has a diagnosis of vitamin D deficiency. She is currently taking prescription Vit D. She notes fatigue and denies nausea, vomiting or muscle weakness.  Diabetes II with Hyperglycemia Erin [name] has a diagnosis of diabetes type II. Tawnia denies feeling of hypoglycemia or GI side effects of Rybelsus. She states her fasting BGs range between 120 and 125. Last A1c was 8.0. She has been working on intensive lifestyle modifications including diet, exercise, and weight loss to help control her blood glucose levels.  ASSESSMENT AND PLAN:  Class 3 severe obesity with serious comorbidity and body mass index (BMI) of 45.0 to 49.9 in adult, unspecified obesity type (Erin [name])  Type 2 diabetes mellitus with hyperglycemia, without long-term current use of insulin (HCC) - Plan: Semaglutide (RYBELSUS) 7 MG TABS  Vitamin D deficiency - Plan: Vitamin D, Ergocalciferol, (DRISDOL) 1.25 MG (50000 UT) CAPS  capsule  PLAN:  Vitamin D Deficiency Erin [name] was informed that low vitamin D levels contributes to fatigue and are associated with obesity, breast, and colon cancer. [name] agrees to continue taking prescription Vit D 50,000 IU every 14 days #4 with no refills. She will follow up for routine testing of vitamin D, at least 2-3 times per year. She was informed of the risk of over-replacement of vitamin D and agrees to not increase her dose unless she discusses this with [name] first. Erin [name] agrees to follow up with our clinic in 2 weeks.  Diabetes II with Hyperglycemia Erin [name] has been given extensive diabetes education by myself today including ideal fasting and post-prandial blood glucose readings, individual ideal Hg A1c goals and hypoglycemia prevention. We discussed the importance of good blood sugar control to decrease the likelihood of diabetic complications such as nephropathy, neuropathy, limb loss, blindness, coronary artery disease, and death. We discussed the importance of intensive lifestyle modification including diet, exercise and weight loss as the first line treatment for diabetes. Erin [name] agrees to continue taking Rybelsus 7 mg PO daily #30 and we will refill for 1 month. Erin [name] agrees to follow up with our clinic in 2 weeks.  Obesity Erin [name] is currently in the action stage of change. As such, her goal is to continue with weight loss efforts She has agreed to follow the Category 3 plan Erin [name] has been instructed to work up to a goal of 150 minutes of combined cardio and strengthening exercise per week for weight loss and overall health benefits. We discussed  the following Behavioral Modification Strategies today: increasing lean protein intake, increasing vegetables and work on meal planning and easy cooking plans, keeping healthy foods in the home, and planning for success   Erin [name] has agreed to follow up with our clinic in 2 weeks. She was informed of the importance of frequent follow up  visits to maximize her success with intensive lifestyle modifications for her multiple health conditions.  ALLERGIES: No Known Allergies  MEDICATIONS: Current Outpatient Medications on File Prior to Visit  Medication Sig Dispense Refill  . aspirin 81 MG tablet Take 81 mg by mouth daily.    . calcium acetate (PHOSLO) 667 MG capsule Take 667 mg by mouth 1 day or 1 dose.    . Cholecalciferol (VITAMIN D-3 PO) Take 1,000 Units by mouth daily.     [name] ibuprofen (ADVIL,MOTRIN) 800 MG tablet TAKE 1 TABLET(S) BY MOUTH 2 TIMES A DAY AS NEEDED [PRN]  3  . losartan (COZAAR) 50 MG tablet Take 50 mg by mouth daily.  5  . metFORMIN (GLUCOPHAGE-XR) 500 MG 24 hr tablet Take 1,000 mg by mouth 2 (two) times daily.  5  . metoprolol tartrate (LOPRESSOR) 25 MG tablet TAKE 1 TABLET BY MOUTH TWICE A DAY 180 tablet 1  . Multiple Vitamin (MULTIVITAMIN) capsule Take 1 capsule by mouth daily.    . rosuvastatin (CRESTOR) 10 MG tablet Take 1 tablet (10 mg total) by mouth daily. 90 tablet 3  . vitamin C (ASCORBIC ACID) 500 MG tablet Take 500 mg by mouth daily.     No current facility-administered medications on file prior to visit.     PAST MEDICAL HISTORY: Past Medical History:  Diagnosis Date  . Anemia   . Atypical chest pain 06/13/2016  . Back pain   . Breast nodule 2009   Right   . Diabetes mellitus   . Fibroid    Symptomatic  . H/O dysmenorrhea   . H/O hematuria   . H/O hypercholesterolemia   . H/O: menorrhagia   . H/O: obesity   . HPV in female   . Hypertension   . Microhematuria 2012  . Shortness of breath 06/13/2016  . Yeast vaginitis 2008    PAST SURGICAL HISTORY: Past Surgical History:  Procedure Laterality Date  . ABDOMINAL HYSTERECTOMY    . HYSTEROSCOPY W/D&C  2003  . POLYPECTOMY  2008  . TUBAL LIGATION  1984   Bilateral    SOCIAL HISTORY: Social History   Tobacco Use  . Smoking status: Never Smoker  . Smokeless tobacco: Never Used  Substance Use Topics  . Alcohol use: No  .  Drug use: No    FAMILY HISTORY: Family History  Problem Relation Age of Onset  . Diabetes Mother   . Valvular heart disease Mother   . Diabetes Brother   . Hypertension Father   . Stroke Father     ROS: Review of Systems  Constitutional: Positive for malaise/fatigue. Negative for weight loss.  Gastrointestinal: Negative for nausea and vomiting.  Musculoskeletal:       Negative muscle weakness  Endo/Heme/Allergies:       Negative hypoglycemia    PHYSICAL EXAM: Pt in no acute distress  RECENT LABS AND TESTS: BMET    Component Value Date/Time   NA 140 04/14/2019 1544   NA 141 12/20/2016 1003   K 4.1 04/14/2019 1544   K 4.1 12/20/2016 1003   CL 99 04/14/2019 1544   CO2 24 04/14/2019 1544   CO2 28 12/20/2016 1003  GLUCOSE 103 (H) 04/14/2019 1544   GLUCOSE 139 12/20/2016 1003   BUN 9 04/14/2019 1544   BUN 10.1 12/20/2016 1003   CREATININE 0.51 (L) 04/14/2019 1544   CREATININE 0.7 12/20/2016 1003   CALCIUM 9.5 04/14/2019 1544   CALCIUM 9.5 12/20/2016 1003   GFRNONAA 107 04/14/2019 1544   GFRAA 123 04/14/2019 1544   Lab Results  Component Value Date   HGBA1C 8.0 (H) 04/14/2019   HGBA1C 8.6 (H) 07/16/2018   Lab Results  Component Value Date   INSULIN 16.3 04/14/2019   INSULIN 24.2 07/16/2018   CBC    Component Value Date/Time   WBC 7.4 07/16/2018 1235   WBC 8.4 01/15/2018 0954   WBC 7.2 12/20/2016 1003   RBC 4.52 07/16/2018 1235   RBC 4.22 01/15/2018 0954   RBC 4.22 01/15/2018 0954   HGB 11.1 07/16/2018 1235   HGB 10.9 (L) 12/20/2016 1003   HCT 35.5 07/16/2018 1235   HCT 34.8 12/20/2016 1003   PLT 312 01/15/2018 0954   PLT 317 12/20/2016 1003   MCV 79 07/16/2018 1235   MCV 80.6 12/20/2016 1003   MCH 24.6 (L) 07/16/2018 1235   MCH 25.6 01/15/2018 0954   MCHC 31.3 (L) 07/16/2018 1235   MCHC 31.2 (L) 01/15/2018 0954   RDW 14.1 07/16/2018 1235   RDW 14.9 (H) 12/20/2016 1003   LYMPHSABS 2.2 07/16/2018 1235   LYMPHSABS 2.0 12/20/2016 1003   MONOABS  0.5 01/15/2018 0954   MONOABS 0.5 12/20/2016 1003   EOSABS 0.2 07/16/2018 1235   BASOSABS 0.0 07/16/2018 1235   BASOSABS 0.0 12/20/2016 1003   Iron/TIBC/Ferritin/ %Sat    Component Value Date/Time   IRON 38 (L) 12/20/2016 1003   TIBC 244 12/20/2016 1003   FERRITIN 590 (H) 12/20/2016 1003   IRONPCTSAT 16 (L) 12/20/2016 1003   Lipid Panel     Component Value Date/Time   CHOL 134 04/14/2019 1544   TRIG 95 04/14/2019 1544   HDL 47 04/14/2019 1544   LDLCALC 68 04/14/2019 1544   Hepatic Function Panel     Component Value Date/Time   PROT 6.7 04/14/2019 1544   PROT 7.0 12/20/2016 1003   ALBUMIN 4.2 04/14/2019 1544   ALBUMIN 3.7 12/20/2016 1003   AST 14 04/14/2019 1544   AST 15 12/20/2016 1003   ALT 12 04/14/2019 1544   ALT 13 12/20/2016 1003   ALKPHOS 83 04/14/2019 1544   ALKPHOS 87 12/20/2016 1003   BILITOT 0.3 04/14/2019 1544   BILITOT 0.49 12/20/2016 1003      Component Value Date/Time   TSH 3.690 07/16/2018 1235   TSH 5.22 (H) 06/13/2016 0918   TSH 1.960 03/08/2015 1257      I, Trixie Dredge, am acting as transcriptionist for Ilene Qua, MD  I have reviewed the above documentation for accuracy and completeness, and I agree with the above. - Ilene Qua, MD

## 2019-05-11 ENCOUNTER — Encounter: Payer: Self-pay | Admitting: Orthopaedic Surgery

## 2019-05-11 ENCOUNTER — Ambulatory Visit (INDEPENDENT_AMBULATORY_CARE_PROVIDER_SITE_OTHER): Payer: Worker's Compensation | Admitting: Orthopaedic Surgery

## 2019-05-11 DIAGNOSIS — M545 Low back pain, unspecified: Secondary | ICD-10-CM

## 2019-05-11 NOTE — Progress Notes (Signed)
Office Visit Note   Patient: Erin [name]           Date of Birth: 1960/12/05           MRN: [id number] Visit Date: 05/11/2019              Requested by: No referring provider defined for this encounter. PCP: Erin Austin, MD (Inactive)   Assessment & Plan: Visit Diagnoses:  1. Low back pain, unspecified back pain laterality, unspecified chronicity, unspecified whether sciatica present     Plan: Patient will continue to work on weight loss walking program.  She should be able to continue losing weight once the gym reopens and she can increase her activities.  I will check her back again in 6 months when she gets her BMI below 40 we will proceed with reimaging studies.  Pathophysiology discussed.  She is congratulated on the weight loss she has made to this point.  Follow-Up Instructions: Return in about 6 months (around 11/08/2019).   Orders:  No orders of the defined types were placed in this encounter.  No orders of the defined types were placed in this encounter.     Procedures: No procedures performed   Clinical Data: No additional findings.   Subjective: Chief Complaint  Patient presents with  . Lower Back - Pain, Follow-up    HPI 58 year old female returns for follow-up with chronic back pain.  She is doing regular work and works 12-hour 7 days/week.  She states she is taken some Advil with some relief she has back pain more left leg burning that sometimes wakes her up at night.  She has occasional sharp stabbing pain in the middle of her back.  She has been working with the weight loss position and is lost 12 pounds in the last 2 months.  BMI currently is 47.7.  She denies bowel or bladder symptoms no history of falling.  Previous MRI scan 2007 showed extraforaminal disc protrusion L2-3, left.  Mild annular bulging at L3-4 and  L4-5.[name]  Review of Systems 14 point system update unchanged from 03/10/2019 office visit other than as mentioned in HPI.   Objective: Vital  Signs: BP 136/80   Pulse 67   Ht 5\' 8"  (1.727 m)   Wt (!) 314 lb (142.4 kg)   BMI 47.74 kg/m   Physical Exam Constitutional:      Appearance: She is well-developed.  HENT:     Head: Normocephalic.     Right Ear: External ear normal.     Left Ear: External ear normal.  Eyes:     Pupils: Pupils are equal, round, and reactive to light.  Neck:     Thyroid: No thyromegaly.     Trachea: No tracheal deviation.  Cardiovascular:     Rate and Rhythm: Normal rate.  Pulmonary:     Effort: Pulmonary effort is normal.  Abdominal:     Palpations: Abdomen is soft.  Skin:    General: Skin is warm and dry.  Neurological:     Mental Status: She is alert and oriented to person, place, and time.  Psychiatric:        Behavior: Behavior normal.     Ortho Exam patient has some mild static notch tenderness both right and left.  Quads are strong.  Negative logroll of the hip.  No calf atrophy.  Anterior tib EHL test normal.  Specialty Comments:  No specialty comments available.  Imaging: No results found.   PMFS History: Patient  Active Problem List   Diagnosis Date Noted  . Low back pain 05/11/2019  . Morbid (severe) obesity due to excess calories ([place]) 11/25/2016  . Shortness of breath 06/13/2016  . Atypical chest pain 06/13/2016  . Anemia of chronic disease 03/08/2015  . High blood pressure 07/22/2012  . Diabetes mellitus ([place]) 07/22/2012   Past Medical History:  Diagnosis Date  . Anemia   . Atypical chest pain 06/13/2016  . Back pain   . Breast nodule 2009   Right   . Diabetes mellitus   . Fibroid    Symptomatic  . H/O dysmenorrhea   . H/O hematuria   . H/O hypercholesterolemia   . H/O: menorrhagia   . H/O: obesity   . HPV in female   . Hypertension   . Microhematuria 2012  . Shortness of breath 06/13/2016  . Yeast vaginitis 2008    Family History  Problem Relation Age of Onset  . Diabetes Mother   . Valvular heart disease Mother   . Diabetes Brother   .  Hypertension Father   . Stroke Father     Past Surgical History:  Procedure Laterality Date  . ABDOMINAL HYSTERECTOMY    . HYSTEROSCOPY W/D&C  2003  . POLYPECTOMY  2008  . TUBAL LIGATION  1984   Bilateral   Social History   Occupational History  . Occupation: Glass blower/designer  Tobacco Use  . Smoking status: Never Smoker  . Smokeless tobacco: Never Used  Substance and Sexual Activity  . Alcohol use: No  . Drug use: No  . Sexual activity: Never    Comment: hysterectomy

## 2019-05-13 ENCOUNTER — Telehealth: Payer: Self-pay

## 2019-05-13 NOTE — Telephone Encounter (Signed)
Emailed 05/11/19 office note to the wc adj per her request

## 2019-05-20 ENCOUNTER — Ambulatory Visit (INDEPENDENT_AMBULATORY_CARE_PROVIDER_SITE_OTHER): Payer: 59 | Admitting: Family Medicine

## 2019-05-20 ENCOUNTER — Encounter (INDEPENDENT_AMBULATORY_CARE_PROVIDER_SITE_OTHER): Payer: Self-pay | Admitting: Family Medicine

## 2019-05-20 ENCOUNTER — Other Ambulatory Visit: Payer: Self-pay

## 2019-05-20 DIAGNOSIS — E538 Deficiency of other specified B group vitamins: Secondary | ICD-10-CM | POA: Diagnosis not present

## 2019-05-20 DIAGNOSIS — E1165 Type 2 diabetes mellitus with hyperglycemia: Secondary | ICD-10-CM

## 2019-05-20 DIAGNOSIS — Z6841 Body Mass Index (BMI) 40.0 and over, adult: Secondary | ICD-10-CM

## 2019-05-20 MED ORDER — RYBELSUS 7 MG PO TABS: 7.0000 mg | ORAL_TABLET | Freq: Every day | ORAL | 0 refills | Status: DC

## 2019-05-24 NOTE — Progress Notes (Signed)
Office: [phone]  /  Fax: [phone] TeleHealth Visit:  [name] has verbally consented to this TeleHealth visit today. The patient is located at home, the provider is located at the [employer] [facility] office. The participants in this visit include the listed provider and patient. The visit was conducted today via face time.  HPI:   Chief Complaint: OBESITY [name] is here to discuss her progress with her obesity treatment plan. She is on the Category 3 plan and is following her eating plan approximately 70 % of the time. She states she is walking 10,000-12,000 steps 7 times per week. [name]'s weight was of 312 lbs last night (down 2 lbs since her last televisit). She saw Dr. Lorin Mercy 9 days ago for back pain, and weight loss was encouraged for management of back pain.  We were unable to weigh the patient today for this TeleHealth visit. She feels as if she has lost 2 lbs since her last visit. She has lost 16 lbs since starting treatment with [name].  Diabetes II with Hyperglycemia [name] has a diagnosis of diabetes type II. [name] states her fasting BGs range between 124 and 126. Her blood sugars last night was 142, lowest blood sugar of 100. She denies hypoglycemia or GI side effects of Rybelsus. Last A1c was 8.0. She has been working on intensive lifestyle modifications including diet, exercise, and weight loss to help control her blood glucose levels.  Vitamin B12 Deficiency [name] has a diagnosis of B12 insufficiency and notes fatigue. Last B12 was low, but she was on metformin. She is taking daily multivitamins. She is not a vegetarian and does not have a previous diagnosis of pernicious anemia. She does not have a history of weight loss surgery.   ASSESSMENT AND PLAN:  Vitamin B12 deficiency  Type 2 diabetes mellitus with hyperglycemia, without long-term current use of insulin (HCC) - Plan: Semaglutide (RYBELSUS) 7 MG TABS  Class 3 severe obesity with serious comorbidity  and body mass index (BMI) of 45.0 to 49.9 in adult, unspecified obesity type ([place])  PLAN:  Diabetes II with Hyperglycemia [name] has been given extensive diabetes education by myself today including ideal fasting and post-prandial blood glucose readings, individual ideal Hgb A1c goals and hypoglycemia prevention. We discussed the importance of good blood sugar control to decrease the likelihood of diabetic complications such as nephropathy, neuropathy, limb loss, blindness, coronary artery disease, and death. We discussed the importance of intensive lifestyle modification including diet, exercise and weight loss as the first line treatment for diabetes. Taylia agrees to continue taking Rybelsus 7 mg PO q AM #30 and we will refill for 1 month. Cathleen agrees to follow up with our clinic in 2 weeks.  Vitamin B12 Deficiency [name] will work on increasing B12 rich foods in her diet. B12 supplementation was not prescribed today. [name] is to continue taking OTC multivitamins, and she agrees to follow up with our clinic in 2 weeks.  Obesity [name] is currently in the action stage of change. As such, her goal is to continue with weight loss efforts She has agreed to follow the Category 3 plan [name] has been instructed to work up to a goal of 150 minutes of combined cardio and strengthening exercise per week for weight loss and overall health benefits. We discussed the following Behavioral Modification Strategies today: increasing lean protein intake, increasing vegetables and work on meal planning and easy cooking plans, keeping healthy foods in the home, planning for success, and keep a  strict food journal   [name] has agreed to follow up with our clinic in 2 weeks. She was informed of the importance of frequent follow up visits to maximize her success with intensive lifestyle modifications for her multiple health conditions.  ALLERGIES: No Known Allergies  MEDICATIONS: Current Outpatient  Medications on File Prior to Visit  Medication Sig Dispense Refill  . aspirin 81 MG tablet Take 81 mg by mouth daily.    . calcium acetate (PHOSLO) 667 MG capsule Take 667 mg by mouth 1 day or 1 dose.    . Cholecalciferol (VITAMIN D-3 PO) Take 1,000 Units by mouth daily.     [name] ibuprofen (ADVIL,MOTRIN) 800 MG tablet TAKE 1 TABLET(S) BY MOUTH 2 TIMES A DAY AS NEEDED [PRN]  3  . losartan (COZAAR) 50 MG tablet Take 50 mg by mouth daily.  5  . metFORMIN (GLUCOPHAGE-XR) 500 MG 24 hr tablet Take 1,000 mg by mouth 2 (two) times daily.  5  . metoprolol tartrate (LOPRESSOR) 25 MG tablet TAKE 1 TABLET BY MOUTH TWICE A DAY 180 tablet 1  . Multiple Vitamin (MULTIVITAMIN) capsule Take 1 capsule by mouth daily.    . rosuvastatin (CRESTOR) 10 MG tablet Take 1 tablet (10 mg total) by mouth daily. 90 tablet 3  . vitamin C (ASCORBIC ACID) 500 MG tablet Take 500 mg by mouth daily.    . Vitamin D, Ergocalciferol, (DRISDOL) 1.25 MG (50000 UT) CAPS capsule Take 1 capsule (50,000 Units total) by mouth every 14 (fourteen) days. 4 capsule 0   No current facility-administered medications on file prior to visit.     PAST MEDICAL HISTORY: Past Medical History:  Diagnosis Date  . Anemia   . Atypical chest pain 06/13/2016  . Back pain   . Breast nodule 2009   Right   . Diabetes mellitus   . Fibroid    Symptomatic  . H/O dysmenorrhea   . H/O hematuria   . H/O hypercholesterolemia   . H/O: menorrhagia   . H/O: obesity   . HPV in female   . Hypertension   . Microhematuria 2012  . Shortness of breath 06/13/2016  . Yeast vaginitis 2008    PAST SURGICAL HISTORY: Past Surgical History:  Procedure Laterality Date  . ABDOMINAL HYSTERECTOMY    . HYSTEROSCOPY W/D&C  2003  . POLYPECTOMY  2008  . TUBAL LIGATION  1984   Bilateral    SOCIAL HISTORY: Social History   Tobacco Use  . Smoking status: Never Smoker  . Smokeless tobacco: Never Used  Substance Use Topics  . Alcohol use: No  . Drug use: No     FAMILY HISTORY: Family History  Problem Relation Age of Onset  . Diabetes Mother   . Valvular heart disease Mother   . Diabetes Brother   . Hypertension Father   . Stroke Father     ROS: Review of Systems  Constitutional: Positive for weight loss.  Endo/Heme/Allergies:       Negative hypoglycemia    PHYSICAL EXAM: Pt in no acute distress  RECENT LABS AND TESTS: BMET    Component Value Date/Time   NA 140 04/14/2019 1544   NA 141 12/20/2016 1003   K 4.1 04/14/2019 1544   K 4.1 12/20/2016 1003   CL 99 04/14/2019 1544   CO2 24 04/14/2019 1544   CO2 28 12/20/2016 1003   GLUCOSE 103 (H) 04/14/2019 1544   GLUCOSE 139 12/20/2016 1003   BUN 9 04/14/2019 1544   BUN 10.1  12/20/2016 1003   CREATININE 0.51 (L) 04/14/2019 1544   CREATININE 0.7 12/20/2016 1003   CALCIUM 9.5 04/14/2019 1544   CALCIUM 9.5 12/20/2016 1003   GFRNONAA 107 04/14/2019 1544   GFRAA 123 04/14/2019 1544   Lab Results  Component Value Date   HGBA1C 8.0 (H) 04/14/2019   HGBA1C 8.6 (H) 07/16/2018   Lab Results  Component Value Date   INSULIN 16.3 04/14/2019   INSULIN 24.2 07/16/2018   CBC    Component Value Date/Time   WBC 7.4 07/16/2018 1235   WBC 8.4 01/15/2018 0954   WBC 7.2 12/20/2016 1003   RBC 4.52 07/16/2018 1235   RBC 4.22 01/15/2018 0954   RBC 4.22 01/15/2018 0954   HGB 11.1 07/16/2018 1235   HGB 10.9 (L) 12/20/2016 1003   HCT 35.5 07/16/2018 1235   HCT 34.8 12/20/2016 1003   PLT 312 01/15/2018 0954   PLT 317 12/20/2016 1003   MCV 79 07/16/2018 1235   MCV 80.6 12/20/2016 1003   MCH 24.6 (L) 07/16/2018 1235   MCH 25.6 01/15/2018 0954   MCHC 31.3 (L) 07/16/2018 1235   MCHC 31.2 (L) 01/15/2018 0954   RDW 14.1 07/16/2018 1235   RDW 14.9 (H) 12/20/2016 1003   LYMPHSABS 2.2 07/16/2018 1235   LYMPHSABS 2.0 12/20/2016 1003   MONOABS 0.5 01/15/2018 0954   MONOABS 0.5 12/20/2016 1003   EOSABS 0.2 07/16/2018 1235   BASOSABS 0.0 07/16/2018 1235   BASOSABS 0.0 12/20/2016 1003    Iron/TIBC/Ferritin/ %Sat    Component Value Date/Time   IRON 38 (L) 12/20/2016 1003   TIBC 244 12/20/2016 1003   FERRITIN 590 (H) 12/20/2016 1003   IRONPCTSAT 16 (L) 12/20/2016 1003   Lipid Panel     Component Value Date/Time   CHOL 134 04/14/2019 1544   TRIG 95 04/14/2019 1544   HDL 47 04/14/2019 1544   LDLCALC 68 04/14/2019 1544   Hepatic Function Panel     Component Value Date/Time   PROT 6.7 04/14/2019 1544   PROT 7.0 12/20/2016 1003   ALBUMIN 4.2 04/14/2019 1544   ALBUMIN 3.7 12/20/2016 1003   AST 14 04/14/2019 1544   AST 15 12/20/2016 1003   ALT 12 04/14/2019 1544   ALT 13 12/20/2016 1003   ALKPHOS 83 04/14/2019 1544   ALKPHOS 87 12/20/2016 1003   BILITOT 0.3 04/14/2019 1544   BILITOT 0.49 12/20/2016 1003      Component Value Date/Time   TSH 3.690 07/16/2018 1235   TSH 5.22 (H) 06/13/2016 0918   TSH 1.960 03/08/2015 1257      I, Trixie Dredge, am acting as transcriptionist for Ilene Qua, MD  I have reviewed the above documentation for accuracy and completeness, and I agree with the above. - Ilene Qua, MD

## 2019-06-03 ENCOUNTER — Ambulatory Visit (INDEPENDENT_AMBULATORY_CARE_PROVIDER_SITE_OTHER): Payer: 59 | Admitting: Family Medicine

## 2019-06-08 ENCOUNTER — Other Ambulatory Visit: Payer: Self-pay

## 2019-06-08 ENCOUNTER — Ambulatory Visit (INDEPENDENT_AMBULATORY_CARE_PROVIDER_SITE_OTHER): Payer: 59 | Admitting: Family Medicine

## 2019-06-08 VITALS — BP 116/71 | HR 66 | Temp 98.0°F | Ht 68.0 in | Wt 304.0 lb

## 2019-06-08 DIAGNOSIS — E1165 Type 2 diabetes mellitus with hyperglycemia: Secondary | ICD-10-CM

## 2019-06-08 DIAGNOSIS — Z6841 Body Mass Index (BMI) 40.0 and over, adult: Secondary | ICD-10-CM

## 2019-06-08 DIAGNOSIS — E538 Deficiency of other specified B group vitamins: Secondary | ICD-10-CM

## 2019-06-09 NOTE — Progress Notes (Signed)
Office: [phone]  /  Fax: [phone]   HPI:   Chief Complaint: OBESITY Erin [name] is here to discuss her progress with her obesity treatment plan. She is on the Category 3 plan and is following her eating plan approximately 75 % of the time. She states she is active with working 12 hours 5 times per week. Erin [name] has been trying to work more walking into her day. She is on vacation this week. She is not planning anything for her birthday this year. She does well on the plan if she plans ahead. She notes hunger at night after she gets home from work, but it is due to normal hunger.  Her weight is (!) 304 lb (137.9 kg) today and has had a weight loss of 10 pounds over a period of 8 weeks since her last visit. She has lost 26 lbs since starting treatment with [name].  Vitamin B12 Deficiency Erin [name] has a diagnosis of B12 insufficiency and notes fatigue. Last B12 level was 175. She is on metformin currently. She is not a vegetarian and does not have a previous diagnosis of pernicious anemia. She does not have a history of weight loss surgery.   Diabetes II with Hyperglycemia Erin [name] has a diagnosis of diabetes type II. Erin [name] denies GI side effects of metformin or Rybelsus. She notes only occasional carbohydrate cravings. She denies hypoglycemia. Last A1c was 8.0. She has been working on intensive lifestyle modifications including diet, exercise, and weight loss to help control her blood glucose levels.  ASSESSMENT AND PLAN:  Vitamin B12 deficiency  Type 2 diabetes mellitus with hyperglycemia, without long-term current use of insulin (HCC)  Class 3 severe obesity with serious comorbidity and body mass index (BMI) of 45.0 to 49.9 in adult, unspecified obesity type ([place])  PLAN:  Vitamin B12 Deficiency Erin [name] will work on increasing B12 rich foods in her diet. She was encouraged to take vitamins daily. We will recheck B12 prior to Thanksgiving. Britany agrees to follow up with our clinic in 2  weeks.  Diabetes II with Hyperglycemia Erin [name] has been given extensive diabetes education by myself today including ideal fasting and post-prandial blood glucose readings, individual ideal Hgb A1c goals and hypoglycemia prevention. We discussed the importance of good blood sugar control to decrease the likelihood of diabetic complications such as nephropathy, neuropathy, limb loss, blindness, coronary artery disease, and death. We discussed the importance of intensive lifestyle modification including diet, exercise and weight loss as the first line treatment for diabetes. Erin [name] agrees to continue her current diabetes medications, no refill needed. Erin [name] agrees to follow up with our clinic in 2 weeks.  I spent > than 50% of the 15 minute visit on counseling as documented in the note.  Obesity Erin [name] is currently in the action stage of change. As such, her goal is to continue with weight loss efforts She has agreed to follow the Category 3 plan Erin [name] has been instructed to work up to a goal of 150 minutes of combined cardio and strengthening exercise per week for weight loss and overall health benefits. We discussed the following Behavioral Modification Strategies today: increasing lean protein intake, increasing vegetables, work on meal planning and easy cooking plans, avoiding temptations, keeping healthy foods in the home, and planning for success   Erin [name] has agreed to follow up with our clinic in 2 weeks. She was informed of the importance of frequent follow up visits to maximize her success with intensive lifestyle modifications for her multiple health conditions.  ALLERGIES: No Known Allergies  MEDICATIONS: Current Outpatient Medications on File Prior to Visit  Medication Sig Dispense Refill  . aspirin 81 MG tablet Take 81 mg by mouth daily.    . calcium acetate (PHOSLO) 667 MG capsule Take 667 mg by mouth 1 day or 1 dose.    . Cholecalciferol (VITAMIN D-3 PO) Take 1,000 Units by  mouth daily.     [name] ibuprofen (ADVIL,MOTRIN) 800 MG tablet TAKE 1 TABLET(S) BY MOUTH 2 TIMES A DAY AS NEEDED [PRN]  3  . losartan (COZAAR) 50 MG tablet Take 50 mg by mouth daily.  5  . metFORMIN (GLUCOPHAGE-XR) 500 MG 24 hr tablet Take 1,000 mg by mouth 2 (two) times daily.  5  . metoprolol tartrate (LOPRESSOR) 25 MG tablet TAKE 1 TABLET BY MOUTH TWICE A DAY 180 tablet 1  . Multiple Vitamin (MULTIVITAMIN) capsule Take 1 capsule by mouth daily.    . rosuvastatin (CRESTOR) 10 MG tablet Take 1 tablet (10 mg total) by mouth daily. 90 tablet 3  . Semaglutide (RYBELSUS) 7 MG TABS Take 7 mg by mouth daily. 30 tablet 0  . vitamin C (ASCORBIC ACID) 500 MG tablet Take 500 mg by mouth daily.    . Vitamin D, Ergocalciferol, (DRISDOL) 1.25 MG (50000 UT) CAPS capsule Take 1 capsule (50,000 Units total) by mouth every 14 (fourteen) days. 4 capsule 0   No current facility-administered medications on file prior to visit.     PAST MEDICAL HISTORY: Past Medical History:  Diagnosis Date  . Anemia   . Atypical chest pain 06/13/2016  . Back pain   . Breast nodule 2009   Right   . Diabetes mellitus   . Fibroid    Symptomatic  . H/O dysmenorrhea   . H/O hematuria   . H/O hypercholesterolemia   . H/O: menorrhagia   . H/O: obesity   . HPV in female   . Hypertension   . Microhematuria 2012  . Shortness of breath 06/13/2016  . Yeast vaginitis 2008    PAST SURGICAL HISTORY: Past Surgical History:  Procedure Laterality Date  . ABDOMINAL HYSTERECTOMY    . HYSTEROSCOPY W/D&C  2003  . POLYPECTOMY  2008  . TUBAL LIGATION  1984   Bilateral    SOCIAL HISTORY: Social History   Tobacco Use  . Smoking status: Never Smoker  . Smokeless tobacco: Never Used  Substance Use Topics  . Alcohol use: No  . Drug use: No    FAMILY HISTORY: Family History  Problem Relation Age of Onset  . Diabetes Mother   . Valvular heart disease Mother   . Diabetes Brother   . Hypertension Father   . Stroke Father      ROS: Review of Systems  Constitutional: Positive for weight loss.  Endo/Heme/Allergies:       Negative hypoglycemia    PHYSICAL EXAM: Blood pressure 116/71, pulse 66, temperature 98 F (36.7 C), temperature source Oral, height 5\' 8"  (1.727 m), weight (!) 304 lb (137.9 kg), SpO2 99 %. Body mass index is 46.22 kg/m. Physical Exam Vitals signs reviewed.  Constitutional:      Appearance: Normal appearance. She is obese.  Cardiovascular:     Rate and Rhythm: Normal rate.     Pulses: Normal pulses.  Pulmonary:     Effort: Pulmonary effort is normal.     Breath sounds: Normal breath sounds.  Musculoskeletal: Normal range of motion.  Skin:    General: Skin is warm and dry.  Neurological:  Mental Status: She is alert and oriented to person, place, and time.  Psychiatric:        Mood and Affect: Mood normal.        Behavior: Behavior normal.     RECENT LABS AND TESTS: BMET    Component Value Date/Time   NA 140 04/14/2019 1544   NA 141 12/20/2016 1003   K 4.1 04/14/2019 1544   K 4.1 12/20/2016 1003   CL 99 04/14/2019 1544   CO2 24 04/14/2019 1544   CO2 28 12/20/2016 1003   GLUCOSE 103 (H) 04/14/2019 1544   GLUCOSE 139 12/20/2016 1003   BUN 9 04/14/2019 1544   BUN 10.1 12/20/2016 1003   CREATININE 0.51 (L) 04/14/2019 1544   CREATININE 0.7 12/20/2016 1003   CALCIUM 9.5 04/14/2019 1544   CALCIUM 9.5 12/20/2016 1003   GFRNONAA 107 04/14/2019 1544   GFRAA 123 04/14/2019 1544   Lab Results  Component Value Date   HGBA1C 8.0 (H) 04/14/2019   HGBA1C 8.6 (H) 07/16/2018   Lab Results  Component Value Date   INSULIN 16.3 04/14/2019   INSULIN 24.2 07/16/2018   CBC    Component Value Date/Time   WBC 7.4 07/16/2018 1235   WBC 8.4 01/15/2018 0954   WBC 7.2 12/20/2016 1003   RBC 4.52 07/16/2018 1235   RBC 4.22 01/15/2018 0954   RBC 4.22 01/15/2018 0954   HGB 11.1 07/16/2018 1235   HGB 10.9 (L) 12/20/2016 1003   HCT 35.5 07/16/2018 1235   HCT 34.8 12/20/2016 1003    PLT 312 01/15/2018 0954   PLT 317 12/20/2016 1003   MCV 79 07/16/2018 1235   MCV 80.6 12/20/2016 1003   MCH 24.6 (L) 07/16/2018 1235   MCH 25.6 01/15/2018 0954   MCHC 31.3 (L) 07/16/2018 1235   MCHC 31.2 (L) 01/15/2018 0954   RDW 14.1 07/16/2018 1235   RDW 14.9 (H) 12/20/2016 1003   LYMPHSABS 2.2 07/16/2018 1235   LYMPHSABS 2.0 12/20/2016 1003   MONOABS 0.5 01/15/2018 0954   MONOABS 0.5 12/20/2016 1003   EOSABS 0.2 07/16/2018 1235   BASOSABS 0.0 07/16/2018 1235   BASOSABS 0.0 12/20/2016 1003   Iron/TIBC/Ferritin/ %Sat    Component Value Date/Time   IRON 38 (L) 12/20/2016 1003   TIBC 244 12/20/2016 1003   FERRITIN 590 (H) 12/20/2016 1003   IRONPCTSAT 16 (L) 12/20/2016 1003   Lipid Panel     Component Value Date/Time   CHOL 134 04/14/2019 1544   TRIG 95 04/14/2019 1544   HDL 47 04/14/2019 1544   LDLCALC 68 04/14/2019 1544   Hepatic Function Panel     Component Value Date/Time   PROT 6.7 04/14/2019 1544   PROT 7.0 12/20/2016 1003   ALBUMIN 4.2 04/14/2019 1544   ALBUMIN 3.7 12/20/2016 1003   AST 14 04/14/2019 1544   AST 15 12/20/2016 1003   ALT 12 04/14/2019 1544   ALT 13 12/20/2016 1003   ALKPHOS 83 04/14/2019 1544   ALKPHOS 87 12/20/2016 1003   BILITOT 0.3 04/14/2019 1544   BILITOT 0.49 12/20/2016 1003      Component Value Date/Time   TSH 3.690 07/16/2018 1235   TSH 5.22 (H) 06/13/2016 0918   TSH 1.960 03/08/2015 1257      OBESITY BEHAVIORAL INTERVENTION VISIT  Today's visit was # 16   Starting weight: 330 lbs Starting date: 07/16/18 Today's weight : 304 lbs Today's date: 06/08/2019 Total lbs lost to date: 26    ASK: We discussed the diagnosis of  obesity with Debbe Mounts today and Dorlis agreed to give [name] permission to discuss obesity behavioral modification therapy today.  ASSESS: Lun has the diagnosis of obesity and her BMI today is 46.23 Kalliyan is in the action stage of change   ADVISE: Courtne was educated on the multiple health  risks of obesity as well as the benefit of weight loss to improve her health. She was advised of the need for long term treatment and the importance of lifestyle modifications to improve her current health and to decrease her risk of future health problems.  AGREE: Multiple dietary modification options and treatment options were discussed and  Anglie agreed to follow the recommendations documented in the above note.  ARRANGE: Komalpreet was educated on the importance of frequent visits to treat obesity as outlined per CMS and USPSTF guidelines and agreed to schedule her next follow up appointment today.  I, Viktorya Leifheit, am acting as transcriptionist for Ilene Qua, MD  I have reviewed the above documentation for accuracy and completeness, and I agree with the above. - Ilene Qua, MD

## 2019-06-23 ENCOUNTER — Telehealth (INDEPENDENT_AMBULATORY_CARE_PROVIDER_SITE_OTHER): Payer: 59 | Admitting: Family Medicine

## 2019-06-23 ENCOUNTER — Encounter (INDEPENDENT_AMBULATORY_CARE_PROVIDER_SITE_OTHER): Payer: Self-pay | Admitting: Family Medicine

## 2019-06-23 ENCOUNTER — Other Ambulatory Visit: Payer: Self-pay

## 2019-06-23 DIAGNOSIS — E1165 Type 2 diabetes mellitus with hyperglycemia: Secondary | ICD-10-CM

## 2019-06-23 DIAGNOSIS — Z6841 Body Mass Index (BMI) 40.0 and over, adult: Secondary | ICD-10-CM | POA: Diagnosis not present

## 2019-06-23 DIAGNOSIS — E559 Vitamin D deficiency, unspecified: Secondary | ICD-10-CM | POA: Diagnosis not present

## 2019-06-23 MED ORDER — VITAMIN D (ERGOCALCIFEROL) 1.25 MG (50000 UNIT) PO CAPS: 50000.0000 [IU] | ORAL_CAPSULE | ORAL | 0 refills | Status: DC

## 2019-06-23 MED ORDER — RYBELSUS 7 MG PO TABS: 7.0000 mg | ORAL_TABLET | Freq: Every day | ORAL | 0 refills | Status: DC

## 2019-06-24 NOTE — Progress Notes (Signed)
Office: [phone]  /  Fax: [phone] TeleHealth Visit:  Erin [name] has verbally consented to this TeleHealth visit today. The patient is located at home, the provider is located at the [facility] office. The participants in this visit include the listed provider and patient. The visit was conducted today via face time.  HPI:   Chief Complaint: OBESITY [name] is here to discuss her progress with her obesity treatment plan. She is on the Category 3 plan and is following her eating plan approximately 70 % of the time. She states she is active while working 7 days a week. [name] is still working 12 hours 5 days a week and 2 days for 8 hours. She is trying to plan ahead to ensure she is able to follow her plan. She denies hunger if she consistently is eating on the plan and doesn't wait too long to eat. She reports her weight was 301 lbs. We were unable to weigh the patient today for this TeleHealth visit. She feels as if she has lost weight since her last visit. She has lost 26 lbs since starting treatment with [name].  Diabetes II with Hyperglycemia [name] has a diagnosis of diabetes type II. [name] states her highest BGs range at 170 and lows of 97. Her fasting BGs range in 110's. She states her BGs was 130 this morning. She discussed with her primary care physician increase in Rybelsus, but will hold on this as her BGs are controlled. She denies hypoglycemia. Last A1c was 8.0. She has been working on intensive lifestyle modifications including diet, exercise, and weight loss to help control her blood glucose levels.  Vitamin D Deficiency [name] has a diagnosis of vitamin D deficiency. She is currently taking prescription Vit D. She notes fatigue and denies nausea, vomiting or muscle weakness.  ASSESSMENT AND PLAN:  Type 2 diabetes mellitus with hyperglycemia, without long-term current use of insulin (HCC) - Plan: Semaglutide (RYBELSUS) 7 MG TABS  Vitamin D deficiency -  Plan: Vitamin D, Ergocalciferol, (DRISDOL) 1.25 MG (50000 UT) CAPS capsule  Class 3 severe obesity with serious comorbidity and body mass index (BMI) of 45.0 to 49.9 in adult, unspecified obesity type ([place])  PLAN:  Diabetes II [name] has been given extensive diabetes education by myself today including ideal fasting and post-prandial blood glucose readings, individual ideal HgA1c goals  and hypoglycemia prevention. We discussed the importance of good blood sugar control to decrease the likelihood of diabetic complications such as nephropathy, neuropathy, limb loss, blindness, coronary artery disease, and death. We discussed the importance of intensive lifestyle modification including diet, exercise and weight loss as the first line treatment for diabetes. Tyshauna agrees to continue taking Rybelsus 7 mg PO q AM #30 and we will refill for 1 month. Socorra agrees to follow up with our clinic in 2 weeks.  Vitamin D Deficiency Tishawna was informed that low vitamin D levels contributes to fatigue and are associated with obesity, breast, and colon cancer. [name] agrees to continue taking prescription Vit D 50,000 IU every week #4 and we will refill for 1 month. She will follow up for routine testing of vitamin D, at least 2-3 times per year. She was informed of the risk of over-replacement of vitamin D and agrees to not increase her dose unless she discusses this with [name] first. Biak agrees to follow up with our clinic in 2 weeks.  Obesity [name] is currently in the action stage of change. As such, her goal is  to continue with weight loss efforts She has agreed to follow the Category 3 plan [name] has been instructed to work up to a goal of 150 minutes of combined cardio and strengthening exercise per week for weight loss and overall health benefits. We discussed the following Behavioral Modification Strategies today: increasing lean protein intake, increasing vegetables and work on meal planning and easy  cooking plans, keeping healthy foods in the home, and planning for success   [name] has agreed to follow up with our clinic in 2 weeks with Dr. Leafy [name]. She was informed of the importance of frequent follow up visits to maximize her success with intensive lifestyle modifications for her multiple health conditions.  ALLERGIES: No Known Allergies  MEDICATIONS: Current Outpatient Medications on File Prior to Visit  Medication Sig Dispense Refill  . aspirin 81 MG tablet Take 81 mg by mouth daily.    . calcium acetate (PHOSLO) 667 MG capsule Take 667 mg by mouth 1 day or 1 dose.    . Cholecalciferol (VITAMIN D-3 PO) Take 1,000 Units by mouth daily.     [name] ibuprofen (ADVIL,MOTRIN) 800 MG tablet TAKE 1 TABLET(S) BY MOUTH 2 TIMES A DAY AS NEEDED [PRN]  3  . losartan (COZAAR) 50 MG tablet Take 50 mg by mouth daily.  5  . metFORMIN (GLUCOPHAGE-XR) 500 MG 24 hr tablet Take 1,000 mg by mouth 2 (two) times daily.  5  . metoprolol tartrate (LOPRESSOR) 25 MG tablet TAKE 1 TABLET BY MOUTH TWICE A DAY 180 tablet 1  . Multiple Vitamin (MULTIVITAMIN) capsule Take 1 capsule by mouth daily.    . rosuvastatin (CRESTOR) 10 MG tablet Take 1 tablet (10 mg total) by mouth daily. 90 tablet 3  . vitamin C (ASCORBIC ACID) 500 MG tablet Take 500 mg by mouth daily.     No current facility-administered medications on file prior to visit.     PAST MEDICAL HISTORY: Past Medical History:  Diagnosis Date  . Anemia   . Atypical chest pain 06/13/2016  . Back pain   . Breast nodule 2009   Right   . Diabetes mellitus   . Fibroid    Symptomatic  . H/O dysmenorrhea   . H/O hematuria   . H/O hypercholesterolemia   . H/O: menorrhagia   . H/O: obesity   . HPV in female   . Hypertension   . Microhematuria 2012  . Shortness of breath 06/13/2016  . Yeast vaginitis 2008    PAST SURGICAL HISTORY: Past Surgical History:  Procedure Laterality Date  . ABDOMINAL HYSTERECTOMY    . HYSTEROSCOPY W/D&C  2003  . POLYPECTOMY   2008  . TUBAL LIGATION  1984   Bilateral    SOCIAL HISTORY: Social History   Tobacco Use  . Smoking status: Never Smoker  . Smokeless tobacco: Never Used  Substance Use Topics  . Alcohol use: No  . Drug use: No    FAMILY HISTORY: Family History  Problem Relation Age of Onset  . Diabetes Mother   . Valvular heart disease Mother   . Diabetes Brother   . Hypertension Father   . Stroke Father     ROS: Review of Systems  Constitutional: Positive for malaise/fatigue and weight loss.  Gastrointestinal: Negative for nausea and vomiting.  Musculoskeletal:       Negative muscle weakness  Endo/Heme/Allergies:       Negative hypoglycemia    PHYSICAL EXAM: Pt in no acute distress  RECENT LABS AND TESTS: BMET  Component Value Date/Time   NA 140 04/14/2019 1544   NA 141 12/20/2016 1003   K 4.1 04/14/2019 1544   K 4.1 12/20/2016 1003   CL 99 04/14/2019 1544   CO2 24 04/14/2019 1544   CO2 28 12/20/2016 1003   GLUCOSE 103 (H) 04/14/2019 1544   GLUCOSE 139 12/20/2016 1003   BUN 9 04/14/2019 1544   BUN 10.1 12/20/2016 1003   CREATININE 0.51 (L) 04/14/2019 1544   CREATININE 0.7 12/20/2016 1003   CALCIUM 9.5 04/14/2019 1544   CALCIUM 9.5 12/20/2016 1003   GFRNONAA 107 04/14/2019 1544   GFRAA 123 04/14/2019 1544   Lab Results  Component Value Date   HGBA1C 8.0 (H) 04/14/2019   HGBA1C 8.6 (H) 07/16/2018   Lab Results  Component Value Date   INSULIN 16.3 04/14/2019   INSULIN 24.2 07/16/2018   CBC    Component Value Date/Time   WBC 7.4 07/16/2018 1235   WBC 8.4 01/15/2018 0954   WBC 7.2 12/20/2016 1003   RBC 4.52 07/16/2018 1235   RBC 4.22 01/15/2018 0954   RBC 4.22 01/15/2018 0954   HGB 11.1 07/16/2018 1235   HGB 10.9 (L) 12/20/2016 1003   HCT 35.5 07/16/2018 1235   HCT 34.8 12/20/2016 1003   PLT 312 01/15/2018 0954   PLT 317 12/20/2016 1003   MCV 79 07/16/2018 1235   MCV 80.6 12/20/2016 1003   MCH 24.6 (L) 07/16/2018 1235   MCH 25.6 01/15/2018 0954    MCHC 31.3 (L) 07/16/2018 1235   MCHC 31.2 (L) 01/15/2018 0954   RDW 14.1 07/16/2018 1235   RDW 14.9 (H) 12/20/2016 1003   LYMPHSABS 2.2 07/16/2018 1235   LYMPHSABS 2.0 12/20/2016 1003   MONOABS 0.5 01/15/2018 0954   MONOABS 0.5 12/20/2016 1003   EOSABS 0.2 07/16/2018 1235   BASOSABS 0.0 07/16/2018 1235   BASOSABS 0.0 12/20/2016 1003   Iron/TIBC/Ferritin/ %Sat    Component Value Date/Time   IRON 38 (L) 12/20/2016 1003   TIBC 244 12/20/2016 1003   FERRITIN 590 (H) 12/20/2016 1003   IRONPCTSAT 16 (L) 12/20/2016 1003   Lipid Panel     Component Value Date/Time   CHOL 134 04/14/2019 1544   TRIG 95 04/14/2019 1544   HDL 47 04/14/2019 1544   LDLCALC 68 04/14/2019 1544   Hepatic Function Panel     Component Value Date/Time   PROT 6.7 04/14/2019 1544   PROT 7.0 12/20/2016 1003   ALBUMIN 4.2 04/14/2019 1544   ALBUMIN 3.7 12/20/2016 1003   AST 14 04/14/2019 1544   AST 15 12/20/2016 1003   ALT 12 04/14/2019 1544   ALT 13 12/20/2016 1003   ALKPHOS 83 04/14/2019 1544   ALKPHOS 87 12/20/2016 1003   BILITOT 0.3 04/14/2019 1544   BILITOT 0.49 12/20/2016 1003      Component Value Date/Time   TSH 3.690 07/16/2018 1235   TSH 5.22 (H) 06/13/2016 0918   TSH 1.960 03/08/2015 1257      I, Trixie Dredge, am acting as transcriptionist for Ilene Qua, MD  I have reviewed the above documentation for accuracy and completeness, and I agree with the above. - Ilene Qua, MD

## 2019-07-07 ENCOUNTER — Telehealth (INDEPENDENT_AMBULATORY_CARE_PROVIDER_SITE_OTHER): Payer: 59 | Admitting: Family Medicine

## 2019-07-07 ENCOUNTER — Encounter (INDEPENDENT_AMBULATORY_CARE_PROVIDER_SITE_OTHER): Payer: Self-pay | Admitting: Family Medicine

## 2019-07-07 DIAGNOSIS — Z794 Long term (current) use of insulin: Secondary | ICD-10-CM

## 2019-07-07 DIAGNOSIS — Z6841 Body Mass Index (BMI) 40.0 and over, adult: Secondary | ICD-10-CM | POA: Diagnosis not present

## 2019-07-07 DIAGNOSIS — E119 Type 2 diabetes mellitus without complications: Secondary | ICD-10-CM | POA: Diagnosis not present

## 2019-07-08 NOTE — Progress Notes (Signed)
Office: [phone]  /  Fax: [phone] TeleHealth Visit:  Erin [name] has verbally consented to this TeleHealth visit today. The patient is located at home, the provider is located at the [facility] office. The participants in this visit include the listed provider and patient. The visit was conducted today via face time.  HPI:   Chief Complaint: OBESITY Erin [name] is here to discuss her progress with her obesity treatment plan. She is on the Category 3 plan and is following her eating plan approximately 60 % of the time. She states she is doing a lot of walking at work 7 times per week. Erin [name] states she is maintaining her weight. She struggles with meal planning and shopping due to working 7 days a week for 10-12 hours shifts. She is on her feet all day at well and exhausted when she comes home.  We were unable to weigh the patient today for this TeleHealth visit. She feels as if she has maintained her weight since her last visit. She has lost 26 lbs since starting treatment with [name].  Diabetes II Erin [name] has a diagnosis of diabetes type II. Erin [name] is on metformin and Rybelsus. She states her fasting BGs range mostly in 130's. She states her CBGs range between 120 and 125 this morning. Last A1c was 8.0. She denies nausea, vomiting, or hypoglycemia. She has been working on intensive lifestyle modifications including diet, exercise, and weight loss to help control her blood glucose levels.  ASSESSMENT AND PLAN:  Type 2 diabetes mellitus without complication, with long-term current use of insulin (HCC)  Class 3 severe obesity with serious comorbidity and body mass index (BMI) of 45.0 to 49.9 in adult, unspecified obesity type ([place])  PLAN:  Diabetes II Erin [name] has been given extensive diabetes education by myself today including ideal fasting and post-prandial blood glucose readings, individual ideal Hgb A1c goals and hypoglycemia prevention. We discussed the importance of  good blood sugar control to decrease the likelihood of diabetic complications such as nephropathy, neuropathy, limb loss, blindness, coronary artery disease, and death. We discussed the importance of intensive lifestyle modification including diet, exercise and weight loss as the first line treatment for diabetes. Erin [name] agrees to continue metformin and Rybelsus, may increase dose at her next visit depending on her glucose readings at that time. Erin [name] agrees to follow up with our clinic in 2 weeks.  I spent > than 50% of the 15 minute visit on counseling as documented in the note.  Obesity Erin [name] is currently in the action stage of change. As such, her goal is to maintain weight for now She has agreed to follow the Category 3 plan  Erin [name]'s goal is to continue to maintain her weight, and she will work on getting back on track when her schedule lightens up. Erin [name] has been instructed to work up to a goal of 150 minutes of combined cardio and strengthening exercise per week for weight loss and overall health benefits. We discussed the following Behavioral Modification Strategies today: increasing lean protein intake and no skipping meals   Erin [name] has agreed to follow up with our clinic in 2 weeks. She was informed of the importance of frequent follow up visits to maximize her success with intensive lifestyle modifications for her multiple health conditions.  ALLERGIES: No Known Allergies  MEDICATIONS: Current Outpatient Medications on File Prior to Visit  Medication Sig Dispense Refill  . aspirin 81 MG tablet Take 81 mg by mouth daily.    [name]  calcium acetate (PHOSLO) 667 MG capsule Take 667 mg by mouth 1 day or 1 dose.    . Cholecalciferol (VITAMIN D-3 PO) Take 1,000 Units by mouth daily.     [name] ibuprofen (ADVIL,MOTRIN) 800 MG tablet TAKE 1 TABLET(S) BY MOUTH 2 TIMES A DAY AS NEEDED [PRN]  3  . losartan (COZAAR) 50 MG tablet Take 50 mg by mouth daily.  5  . metFORMIN (GLUCOPHAGE-XR) 500 MG 24  hr tablet Take 1,000 mg by mouth 2 (two) times daily.  5  . metoprolol tartrate (LOPRESSOR) 25 MG tablet TAKE 1 TABLET BY MOUTH TWICE A DAY 180 tablet 1  . Multiple Vitamin (MULTIVITAMIN) capsule Take 1 capsule by mouth daily.    . rosuvastatin (CRESTOR) 10 MG tablet Take 1 tablet (10 mg total) by mouth daily. 90 tablet 3  . Semaglutide (RYBELSUS) 7 MG TABS Take 7 mg by mouth daily. 30 tablet 0  . vitamin C (ASCORBIC ACID) 500 MG tablet Take 500 mg by mouth daily.    . Vitamin D, Ergocalciferol, (DRISDOL) 1.25 MG (50000 UT) CAPS capsule Take 1 capsule (50,000 Units total) by mouth every 14 (fourteen) days. 4 capsule 0   No current facility-administered medications on file prior to visit.     PAST MEDICAL HISTORY: Past Medical History:  Diagnosis Date  . Anemia   . Atypical chest pain 06/13/2016  . Back pain   . Breast nodule 2009   Right   . Diabetes mellitus   . Fibroid    Symptomatic  . H/O dysmenorrhea   . H/O hematuria   . H/O hypercholesterolemia   . H/O: menorrhagia   . H/O: obesity   . HPV in female   . Hypertension   . Microhematuria 2012  . Shortness of breath 06/13/2016  . Yeast vaginitis 2008    PAST SURGICAL HISTORY: Past Surgical History:  Procedure Laterality Date  . ABDOMINAL HYSTERECTOMY    . HYSTEROSCOPY W/D&C  2003  . POLYPECTOMY  2008  . TUBAL LIGATION  1984   Bilateral    SOCIAL HISTORY: Social History   Tobacco Use  . Smoking status: Never Smoker  . Smokeless tobacco: Never Used  Substance Use Topics  . Alcohol use: No  . Drug use: No    FAMILY HISTORY: Family History  Problem Relation Age of Onset  . Diabetes Mother   . Valvular heart disease Mother   . Diabetes Brother   . Hypertension Father   . Stroke Father     ROS: Review of Systems  Constitutional: Negative for weight loss.  Gastrointestinal: Negative for nausea and vomiting.  Endo/Heme/Allergies:       Negative hypoglycemia    PHYSICAL EXAM: Pt in no acute distress   RECENT LABS AND TESTS: BMET    Component Value Date/Time   NA 140 04/14/2019 1544   NA 141 12/20/2016 1003   K 4.1 04/14/2019 1544   K 4.1 12/20/2016 1003   CL 99 04/14/2019 1544   CO2 24 04/14/2019 1544   CO2 28 12/20/2016 1003   GLUCOSE 103 (H) 04/14/2019 1544   GLUCOSE 139 12/20/2016 1003   BUN 9 04/14/2019 1544   BUN 10.1 12/20/2016 1003   CREATININE 0.51 (L) 04/14/2019 1544   CREATININE 0.7 12/20/2016 1003   CALCIUM 9.5 04/14/2019 1544   CALCIUM 9.5 12/20/2016 1003   GFRNONAA 107 04/14/2019 1544   GFRAA 123 04/14/2019 1544   Lab Results  Component Value Date   HGBA1C 8.0 (H) 04/14/2019  HGBA1C 8.6 (H) 07/16/2018   Lab Results  Component Value Date   INSULIN 16.3 04/14/2019   INSULIN 24.2 07/16/2018   CBC    Component Value Date/Time   WBC 7.4 07/16/2018 1235   WBC 8.4 01/15/2018 0954   WBC 7.2 12/20/2016 1003   RBC 4.52 07/16/2018 1235   RBC 4.22 01/15/2018 0954   RBC 4.22 01/15/2018 0954   HGB 11.1 07/16/2018 1235   HGB 10.9 (L) 12/20/2016 1003   HCT 35.5 07/16/2018 1235   HCT 34.8 12/20/2016 1003   PLT 312 01/15/2018 0954   PLT 317 12/20/2016 1003   MCV 79 07/16/2018 1235   MCV 80.6 12/20/2016 1003   MCH 24.6 (L) 07/16/2018 1235   MCH 25.6 01/15/2018 0954   MCHC 31.3 (L) 07/16/2018 1235   MCHC 31.2 (L) 01/15/2018 0954   RDW 14.1 07/16/2018 1235   RDW 14.9 (H) 12/20/2016 1003   LYMPHSABS 2.2 07/16/2018 1235   LYMPHSABS 2.0 12/20/2016 1003   MONOABS 0.5 01/15/2018 0954   MONOABS 0.5 12/20/2016 1003   EOSABS 0.2 07/16/2018 1235   BASOSABS 0.0 07/16/2018 1235   BASOSABS 0.0 12/20/2016 1003   Iron/TIBC/Ferritin/ %Sat    Component Value Date/Time   IRON 38 (L) 12/20/2016 1003   TIBC 244 12/20/2016 1003   FERRITIN 590 (H) 12/20/2016 1003   IRONPCTSAT 16 (L) 12/20/2016 1003   Lipid Panel     Component Value Date/Time   CHOL 134 04/14/2019 1544   TRIG 95 04/14/2019 1544   HDL 47 04/14/2019 1544   LDLCALC 68 04/14/2019 1544   Hepatic  Function Panel     Component Value Date/Time   PROT 6.7 04/14/2019 1544   PROT 7.0 12/20/2016 1003   ALBUMIN 4.2 04/14/2019 1544   ALBUMIN 3.7 12/20/2016 1003   AST 14 04/14/2019 1544   AST 15 12/20/2016 1003   ALT 12 04/14/2019 1544   ALT 13 12/20/2016 1003   ALKPHOS 83 04/14/2019 1544   ALKPHOS 87 12/20/2016 1003   BILITOT 0.3 04/14/2019 1544   BILITOT 0.49 12/20/2016 1003      Component Value Date/Time   TSH 3.690 07/16/2018 1235   TSH 5.22 (H) 06/13/2016 0918   TSH 1.960 03/08/2015 1257      I, Trixie Dredge, am acting as transcriptionist for Dennard Nip, MD I have reviewed the above documentation for accuracy and completeness, and I agree with the above. -Dennard Nip, MD

## 2019-07-14 ENCOUNTER — Other Ambulatory Visit (INDEPENDENT_AMBULATORY_CARE_PROVIDER_SITE_OTHER): Payer: Self-pay | Admitting: Family Medicine

## 2019-07-14 DIAGNOSIS — E559 Vitamin D deficiency, unspecified: Secondary | ICD-10-CM

## 2019-07-21 ENCOUNTER — Telehealth (INDEPENDENT_AMBULATORY_CARE_PROVIDER_SITE_OTHER): Payer: 59 | Admitting: Family Medicine

## 2019-07-21 ENCOUNTER — Other Ambulatory Visit: Payer: Self-pay

## 2019-07-21 ENCOUNTER — Encounter (INDEPENDENT_AMBULATORY_CARE_PROVIDER_SITE_OTHER): Payer: Self-pay | Admitting: Family Medicine

## 2019-07-21 DIAGNOSIS — Z6841 Body Mass Index (BMI) 40.0 and over, adult: Secondary | ICD-10-CM | POA: Diagnosis not present

## 2019-07-21 DIAGNOSIS — E119 Type 2 diabetes mellitus without complications: Secondary | ICD-10-CM | POA: Diagnosis not present

## 2019-07-21 DIAGNOSIS — Z794 Long term (current) use of insulin: Secondary | ICD-10-CM | POA: Diagnosis not present

## 2019-07-21 NOTE — Progress Notes (Signed)
Office: [phone]  /  Fax: [phone] TeleHealth Visit:  Erin [name] has verbally consented to this TeleHealth visit today. The patient is located at home, the provider is located at the [employer] [facility] office. The participants in this visit include the listed provider and patient. The visit was conducted today via FaceTime.  HPI:   Chief Complaint: OBESITY Erin [name] is here to discuss her progress with her obesity treatment plan. She is on the Category 3 plan and is following her eating plan approximately 75% of the time. She states she is exercising 0 minutes 0 times per week. Erin [name] feels she has lost another 3 lbs since her last visit. She has been on antibiotics for cellulitis and her appetite has decreased. Before this she was doing well on her Category 3 plan. We were unable to weigh the patient today for this TeleHealth visit. She feels as if she has lost weight since her last visit. She has lost 26 lbs since starting treatment with Erin [name].  Diabetes II Erin [name] has a diagnosis of diabetes type II. Erin [name]'s blood sugars have increased since her recent cellulitis infection. She states fasting blood sugars mostly are 120-140's now, but had been as high as 160-170's. She denies any hypoglycemic episodes. Last A1c was 8.0 on 04/14/2019. She has been working on intensive lifestyle modifications including diet, exercise, and weight loss to help control her blood glucose levels.  ASSESSMENT AND PLAN:  Type 2 diabetes mellitus without complication, with long-term current use of insulin (HCC)  Class 3 severe obesity with serious comorbidity and body mass index (BMI) of 45.0 to 49.9 in adult, unspecified obesity type ([place])  PLAN:  Diabetes II Erin [name] has been given extensive diabetes education by myself today including ideal fasting and post-prandial blood glucose readings, individual ideal HgA1c goals  and hypoglycemia prevention. We discussed the importance of good blood sugar  control to decrease the likelihood of diabetic complications such as nephropathy, neuropathy, limb loss, blindness, coronary artery disease, and death. We discussed the importance of intensive lifestyle modification including diet, exercise and weight loss as the first line treatment for diabetes. Erin [name] was educated on infection and how it affects blood sugars. She will continue with diet, weight loss, and follow-up as directed to monitor her progress.  I spent > than 50% of the 25 minute visit on counseling as documented in the note.  Obesity Erin [name] is currently in the action stage of change. As such, her goal is to continue with weight loss efforts. She has agreed to follow the Category 3 plan. Erin [name] has been instructed to work up to a goal of 150 minutes of combined cardio and strengthening exercise per week for weight loss and overall health benefits. We discussed the following Behavioral Modification Stratagies today: increasing lean protein intake, decreasing simple carbohydrates, increase H20 intake, and holiday eating strategies.  Erin [name] has agreed to follow-up with our clinic in 3 weeks. She was informed of the importance of frequent follow-up visits to maximize her success with intensive lifestyle modifications for her multiple health conditions.  ALLERGIES: No Known Allergies  MEDICATIONS: Current Outpatient Medications on File Prior to Visit  Medication Sig Dispense Refill  . aspirin 81 MG tablet Take 81 mg by mouth daily.    . calcium acetate (PHOSLO) 667 MG capsule Take 667 mg by mouth 1 day or 1 dose.    . Cholecalciferol (VITAMIN D-3 PO) Take 1,000 Units by mouth daily.     . clindamycin (CLEOCIN) 300 MG  capsule Take 300 mg by mouth 4 (four) times daily.    [name] ibuprofen (ADVIL,MOTRIN) 800 MG tablet TAKE 1 TABLET(S) BY MOUTH 2 TIMES A DAY AS NEEDED [PRN]  3  . losartan (COZAAR) 50 MG tablet Take 50 mg by mouth daily.  5  . metFORMIN (GLUCOPHAGE-XR) 500 MG 24 hr tablet Take  1,000 mg by mouth 2 (two) times daily.  5  . metoprolol tartrate (LOPRESSOR) 25 MG tablet TAKE 1 TABLET BY MOUTH TWICE A DAY 180 tablet 1  . Multiple Vitamin (MULTIVITAMIN) capsule Take 1 capsule by mouth daily.    . Semaglutide (RYBELSUS) 7 MG TABS Take 7 mg by mouth daily. 30 tablet 0  . vitamin C (ASCORBIC ACID) 500 MG tablet Take 500 mg by mouth daily.    . Vitamin D, Ergocalciferol, (DRISDOL) 1.25 MG (50000 UT) CAPS capsule Take 1 capsule (50,000 Units total) by mouth every 14 (fourteen) days. 4 capsule 0  . rosuvastatin (CRESTOR) 10 MG tablet Take 1 tablet (10 mg total) by mouth daily. 90 tablet 3   No current facility-administered medications on file prior to visit.     PAST MEDICAL HISTORY: Past Medical History:  Diagnosis Date  . Anemia   . Atypical chest pain 06/13/2016  . Back pain   . Breast nodule 2009   Right   . Diabetes mellitus   . Fibroid    Symptomatic  . H/O dysmenorrhea   . H/O hematuria   . H/O hypercholesterolemia   . H/O: menorrhagia   . H/O: obesity   . HPV in female   . Hypertension   . Microhematuria 2012  . Shortness of breath 06/13/2016  . Yeast vaginitis 2008    PAST SURGICAL HISTORY: Past Surgical History:  Procedure Laterality Date  . ABDOMINAL HYSTERECTOMY    . HYSTEROSCOPY W/D&C  2003  . POLYPECTOMY  2008  . TUBAL LIGATION  1984   Bilateral    SOCIAL HISTORY: Social History   Tobacco Use  . Smoking status: Never Smoker  . Smokeless tobacco: Never Used  Substance Use Topics  . Alcohol use: No  . Drug use: No    FAMILY HISTORY: Family History  Problem Relation Age of Onset  . Diabetes Mother   . Valvular heart disease Mother   . Diabetes Brother   . Hypertension Father   . Stroke Father    ROS: Review of Systems  Endo/Heme/Allergies:       Negative for hypoglycemia.   PHYSICAL EXAM: Pt in no acute distress  RECENT LABS AND TESTS: BMET    Component Value Date/Time   NA 140 04/14/2019 1544   NA 141 12/20/2016  1003   K 4.1 04/14/2019 1544   K 4.1 12/20/2016 1003   CL 99 04/14/2019 1544   CO2 24 04/14/2019 1544   CO2 28 12/20/2016 1003   GLUCOSE 103 (H) 04/14/2019 1544   GLUCOSE 139 12/20/2016 1003   BUN 9 04/14/2019 1544   BUN 10.1 12/20/2016 1003   CREATININE 0.51 (L) 04/14/2019 1544   CREATININE 0.7 12/20/2016 1003   CALCIUM 9.5 04/14/2019 1544   CALCIUM 9.5 12/20/2016 1003   GFRNONAA 107 04/14/2019 1544   GFRAA 123 04/14/2019 1544   Lab Results  Component Value Date   HGBA1C 8.0 (H) 04/14/2019   HGBA1C 8.6 (H) 07/16/2018   Lab Results  Component Value Date   INSULIN 16.3 04/14/2019   INSULIN 24.2 07/16/2018   CBC    Component Value Date/Time   WBC  7.4 07/16/2018 1235   WBC 8.4 01/15/2018 0954   WBC 7.2 12/20/2016 1003   RBC 4.52 07/16/2018 1235   RBC 4.22 01/15/2018 0954   RBC 4.22 01/15/2018 0954   HGB 11.1 07/16/2018 1235   HGB 10.9 (L) 12/20/2016 1003   HCT 35.5 07/16/2018 1235   HCT 34.8 12/20/2016 1003   PLT 312 01/15/2018 0954   PLT 317 12/20/2016 1003   MCV 79 07/16/2018 1235   MCV 80.6 12/20/2016 1003   MCH 24.6 (L) 07/16/2018 1235   MCH 25.6 01/15/2018 0954   MCHC 31.3 (L) 07/16/2018 1235   MCHC 31.2 (L) 01/15/2018 0954   RDW 14.1 07/16/2018 1235   RDW 14.9 (H) 12/20/2016 1003   LYMPHSABS 2.2 07/16/2018 1235   LYMPHSABS 2.0 12/20/2016 1003   MONOABS 0.5 01/15/2018 0954   MONOABS 0.5 12/20/2016 1003   EOSABS 0.2 07/16/2018 1235   BASOSABS 0.0 07/16/2018 1235   BASOSABS 0.0 12/20/2016 1003   Iron/TIBC/Ferritin/ %Sat    Component Value Date/Time   IRON 38 (L) 12/20/2016 1003   TIBC 244 12/20/2016 1003   FERRITIN 590 (H) 12/20/2016 1003   IRONPCTSAT 16 (L) 12/20/2016 1003   Lipid Panel     Component Value Date/Time   CHOL 134 04/14/2019 1544   TRIG 95 04/14/2019 1544   HDL 47 04/14/2019 1544   LDLCALC 68 04/14/2019 1544   Hepatic Function Panel     Component Value Date/Time   PROT 6.7 04/14/2019 1544   PROT 7.0 12/20/2016 1003   ALBUMIN  4.2 04/14/2019 1544   ALBUMIN 3.7 12/20/2016 1003   AST 14 04/14/2019 1544   AST 15 12/20/2016 1003   ALT 12 04/14/2019 1544   ALT 13 12/20/2016 1003   ALKPHOS 83 04/14/2019 1544   ALKPHOS 87 12/20/2016 1003   BILITOT 0.3 04/14/2019 1544   BILITOT 0.49 12/20/2016 1003      Component Value Date/Time   TSH 3.690 07/16/2018 1235   TSH 5.22 (H) 06/13/2016 0918   TSH 1.960 03/08/2015 1257   Results for Tamashiro, Glenola S (MRN [id number]) as of 07/21/2019 11:45  Ref. Range 04/14/2019 15:44  Vitamin D, 25-Hydroxy Latest Ref Range: 30.0 - 100.0 ng/mL 47.4   I, Michaelene Song, am acting as Location manager for Dennard Nip, MD I have reviewed the above documentation for accuracy and completeness, and I agree with the above. -Dennard Nip, MD

## 2019-08-06 ENCOUNTER — Other Ambulatory Visit: Payer: Self-pay | Admitting: Cardiovascular Disease

## 2019-08-10 ENCOUNTER — Encounter (INDEPENDENT_AMBULATORY_CARE_PROVIDER_SITE_OTHER): Payer: Self-pay | Admitting: Family Medicine

## 2019-08-10 ENCOUNTER — Other Ambulatory Visit: Payer: Self-pay

## 2019-08-10 ENCOUNTER — Ambulatory Visit (INDEPENDENT_AMBULATORY_CARE_PROVIDER_SITE_OTHER): Payer: 59 | Admitting: Family Medicine

## 2019-08-10 VITALS — BP 118/74 | HR 65 | Temp 98.2°F | Ht 68.0 in | Wt 309.0 lb

## 2019-08-10 DIAGNOSIS — E1165 Type 2 diabetes mellitus with hyperglycemia: Secondary | ICD-10-CM | POA: Diagnosis not present

## 2019-08-10 DIAGNOSIS — E559 Vitamin D deficiency, unspecified: Secondary | ICD-10-CM | POA: Diagnosis not present

## 2019-08-10 DIAGNOSIS — Z6841 Body Mass Index (BMI) 40.0 and over, adult: Secondary | ICD-10-CM

## 2019-08-10 DIAGNOSIS — Z9189 Other specified personal risk factors, not elsewhere classified: Secondary | ICD-10-CM

## 2019-08-10 MED ORDER — RYBELSUS 7 MG PO TABS: 7.0000 mg | ORAL_TABLET | Freq: Every day | ORAL | 0 refills | Status: DC

## 2019-08-10 NOTE — Progress Notes (Signed)
Office: [phone]  /  Fax: [phone]   HPI:   Chief Complaint: OBESITY Erin [name] is here to discuss her progress with her obesity treatment plan. She is on the Category 3 plan and is following her eating plan approximately 70% of the time. She states she is getting steps in at work. [name] overindulged on Thanksgiving and over the next few days and has gained 5 lbs. She states she is ready to get back on track with her eating plan and weight loss.  Her weight is (!) 309 lb (140.2 kg) today and has had a weight gain of 5 lbs since her last visit. She has lost 21 lbs since starting treatment with [name].  Diabetes II Erin [name] has a diagnosis of diabetes type II. Erin [name] states fasting blood sugars are running mostly in the 120's. Last A1c was 8.0 on 04/14/2019 and she is due for labs. [name] has been struggling with her diet recently. She has been working on intensive lifestyle modifications including diet, exercise, and weight loss to help control her blood glucose levels.  At risk for cardiovascular disease [name] is at a higher than average risk for cardiovascular disease due to obesity. She currently denies any chest pain.  Vitamin D deficiency Erin [name] has a diagnosis of Vitamin D deficiency, which is almost at goal. She is currently taking Vit D every 14 days and denies nausea, vomiting or muscle weakness.  ASSESSMENT AND PLAN:  Type 2 diabetes mellitus with hyperglycemia, without long-term current use of insulin ([place]) - Plan: Comprehensive Metabolic Panel (CMET), HgB A1c, Insulin, random  Vitamin D deficiency - Plan: Vitamin D (25 hydroxy)  At risk for heart disease  Class 3 severe obesity with serious comorbidity and body mass index (BMI) of 45.0 to 49.9 in adult, unspecified obesity type ([place])  PLAN:  Diabetes II Erin [name] has been given diabetes education by myself including ideal fasting and post-prandial blood glucose readings, individual ideal HgA1c goals  and hypoglycemia  prevention. We discussed the importance of good blood sugar control to decrease the likelihood of diabetic complications such as nephropathy, neuropathy, limb loss, blindness, coronary artery disease, and death. We discussed the importance of intensive lifestyle modification including diet, exercise and weight loss as the first line treatment for diabetes. Erin [name] was given a refill on her Rybelsus 7 mg #30 with 0 refills. She agrees to follow-up with our clinic in 2 weeks.  Cardiovascular risk counseling Erin [name] was given extended (15 minutes) coronary artery disease prevention counseling today. She is 58 y.o. female and has risk factors for heart disease including obesity. We discussed intensive lifestyle modifications today with an emphasis on specific weight loss instructions and strategies. Pt was also informed of the importance of increasing exercise and decreasing saturated fats to help prevent heart disease.  Vitamin D Deficiency Erin [name] was informed that low Vitamin D levels contributes to fatigue and are associated with obesity, breast, and colon cancer. She agrees to continue taking Vit D every 14 days and will have routine testing of Vitamin D. She was informed of the risk of over-replacement of Vitamin D and agrees to not increase her dose unless she discusses this with [name] first. Mily agrees to follow-up with our clinic in 2 weeks.  Obesity Erin [name] is currently in the action stage of change. As such, her goal is to continue with weight loss efforts. She has agreed to follow the Category 3 plan. Erin [name] has been instructed to work up to a goal of 150 minutes of combined  cardio and strengthening exercise per week for weight loss and overall health benefits. We discussed the following Behavioral Modification Strategies today: keeping healthy foods in the home, emotional eating strategies, and holiday eating strategies.  Erin [name] has agreed to follow-up with our clinic in 2 weeks. She was informed  of the importance of frequent follow-up visits to maximize her success with intensive lifestyle modifications for her multiple health conditions.  ALLERGIES: No Known Allergies  MEDICATIONS: Current Outpatient Medications on File Prior to Visit  Medication Sig Dispense Refill  . aspirin 81 MG tablet Take 81 mg by mouth daily.    . calcium acetate (PHOSLO) 667 MG capsule Take 667 mg by mouth 1 day or 1 dose.    . Cholecalciferol (VITAMIN D-3 PO) Take 1,000 Units by mouth daily.     [name] ibuprofen (ADVIL,MOTRIN) 800 MG tablet TAKE 1 TABLET(S) BY MOUTH 2 TIMES A DAY AS NEEDED [PRN]  3  . losartan (COZAAR) 50 MG tablet Take 50 mg by mouth daily.  5  . metFORMIN (GLUCOPHAGE-XR) 500 MG 24 hr tablet Take 1,000 mg by mouth 2 (two) times daily.  5  . metoprolol tartrate (LOPRESSOR) 25 MG tablet TAKE 1 TABLET BY MOUTH TWICE A DAY 180 tablet 1  . Multiple Vitamin (MULTIVITAMIN) capsule Take 1 capsule by mouth daily.    . rosuvastatin (CRESTOR) 10 MG tablet TAKE 1 TABLET (10 MG TOTAL) BY MOUTH DAILY. 90 tablet 0  . Semaglutide (RYBELSUS) 7 MG TABS Take 7 mg by mouth daily. 30 tablet 0  . vitamin C (ASCORBIC ACID) 500 MG tablet Take 500 mg by mouth daily.    . Vitamin D, Ergocalciferol, (DRISDOL) 1.25 MG (50000 UT) CAPS capsule Take 1 capsule (50,000 Units total) by mouth every 14 (fourteen) days. 4 capsule 0   No current facility-administered medications on file prior to visit.     PAST MEDICAL HISTORY: Past Medical History:  Diagnosis Date  . Anemia   . Atypical chest pain 06/13/2016  . Back pain   . Breast nodule 2009   Right   . Diabetes mellitus   . Fibroid    Symptomatic  . H/O dysmenorrhea   . H/O hematuria   . H/O hypercholesterolemia   . H/O: menorrhagia   . H/O: obesity   . HPV in female   . Hypertension   . Microhematuria 2012  . Shortness of breath 06/13/2016  . Yeast vaginitis 2008    PAST SURGICAL HISTORY: Past Surgical History:  Procedure Laterality Date  . ABDOMINAL  HYSTERECTOMY    . HYSTEROSCOPY W/D&C  2003  . POLYPECTOMY  2008  . TUBAL LIGATION  1984   Bilateral    SOCIAL HISTORY: Social History   Tobacco Use  . Smoking status: Never Smoker  . Smokeless tobacco: Never Used  Substance Use Topics  . Alcohol use: No  . Drug use: No    FAMILY HISTORY: Family History  Problem Relation Age of Onset  . Diabetes Mother   . Valvular heart disease Mother   . Diabetes Brother   . Hypertension Father   . Stroke Father    ROS: Review of Systems  Gastrointestinal: Negative for nausea and vomiting.  Musculoskeletal:       Negative for muscle weakness.   PHYSICAL EXAM: Blood pressure 118/74, pulse 65, temperature 98.2 F (36.8 C), temperature source Oral, height 5\' 8"  (1.727 m), weight (!) 309 lb (140.2 kg), SpO2 99 %. Body mass index is 46.98 kg/m. Physical Exam Vitals signs  reviewed.  Constitutional:      Appearance: Normal appearance. She is obese.  Cardiovascular:     Rate and Rhythm: Normal rate.     Pulses: Normal pulses.  Pulmonary:     Effort: Pulmonary effort is normal.     Breath sounds: Normal breath sounds.  Musculoskeletal: Normal range of motion.  Skin:    General: Skin is warm and dry.  Neurological:     Mental Status: She is alert and oriented to person, place, and time.  Psychiatric:        Behavior: Behavior normal.   RECENT LABS AND TESTS: BMET    Component Value Date/Time   NA 140 04/14/2019 1544   NA 141 12/20/2016 1003   K 4.1 04/14/2019 1544   K 4.1 12/20/2016 1003   CL 99 04/14/2019 1544   CO2 24 04/14/2019 1544   CO2 28 12/20/2016 1003   GLUCOSE 103 (H) 04/14/2019 1544   GLUCOSE 139 12/20/2016 1003   BUN 9 04/14/2019 1544   BUN 10.1 12/20/2016 1003   CREATININE 0.51 (L) 04/14/2019 1544   CREATININE 0.7 12/20/2016 1003   CALCIUM 9.5 04/14/2019 1544   CALCIUM 9.5 12/20/2016 1003   GFRNONAA 107 04/14/2019 1544   GFRAA 123 04/14/2019 1544   Lab Results  Component Value Date   HGBA1C 8.0 (H)  04/14/2019   HGBA1C 8.6 (H) 07/16/2018   Lab Results  Component Value Date   INSULIN 16.3 04/14/2019   INSULIN 24.2 07/16/2018   CBC    Component Value Date/Time   WBC 7.4 07/16/2018 1235   WBC 8.4 01/15/2018 0954   WBC 7.2 12/20/2016 1003   RBC 4.52 07/16/2018 1235   RBC 4.22 01/15/2018 0954   RBC 4.22 01/15/2018 0954   HGB 11.1 07/16/2018 1235   HGB 10.9 (L) 12/20/2016 1003   HCT 35.5 07/16/2018 1235   HCT 34.8 12/20/2016 1003   PLT 312 01/15/2018 0954   PLT 317 12/20/2016 1003   MCV 79 07/16/2018 1235   MCV 80.6 12/20/2016 1003   MCH 24.6 (L) 07/16/2018 1235   MCH 25.6 01/15/2018 0954   MCHC 31.3 (L) 07/16/2018 1235   MCHC 31.2 (L) 01/15/2018 0954   RDW 14.1 07/16/2018 1235   RDW 14.9 (H) 12/20/2016 1003   LYMPHSABS 2.2 07/16/2018 1235   LYMPHSABS 2.0 12/20/2016 1003   MONOABS 0.5 01/15/2018 0954   MONOABS 0.5 12/20/2016 1003   EOSABS 0.2 07/16/2018 1235   BASOSABS 0.0 07/16/2018 1235   BASOSABS 0.0 12/20/2016 1003   Iron/TIBC/Ferritin/ %Sat    Component Value Date/Time   IRON 38 (L) 12/20/2016 1003   TIBC 244 12/20/2016 1003   FERRITIN 590 (H) 12/20/2016 1003   IRONPCTSAT 16 (L) 12/20/2016 1003   Lipid Panel     Component Value Date/Time   CHOL 134 04/14/2019 1544   TRIG 95 04/14/2019 1544   HDL 47 04/14/2019 1544   LDLCALC 68 04/14/2019 1544   Hepatic Function Panel     Component Value Date/Time   PROT 6.7 04/14/2019 1544   PROT 7.0 12/20/2016 1003   ALBUMIN 4.2 04/14/2019 1544   ALBUMIN 3.7 12/20/2016 1003   AST 14 04/14/2019 1544   AST 15 12/20/2016 1003   ALT 12 04/14/2019 1544   ALT 13 12/20/2016 1003   ALKPHOS 83 04/14/2019 1544   ALKPHOS 87 12/20/2016 1003   BILITOT 0.3 04/14/2019 1544   BILITOT 0.49 12/20/2016 1003      Component Value Date/Time   TSH 3.690 07/16/2018  1235   TSH 5.22 (H) 06/13/2016 0918   TSH 1.960 03/08/2015 1257   Results for Frye, KALYANA FLUHR (MRN [id number]) as of 08/10/2019 11:40  Ref. Range 04/14/2019 15:44   Vitamin D, 25-Hydroxy Latest Ref Range: 30.0 - 100.0 ng/mL 47.4   OBESITY BEHAVIORAL INTERVENTION VISIT  Today's visit was #20   Starting weight: 330 lbs Starting date: 07/16/2018 Today's weight: 309 lbs  Today's date: 08/10/2019 Total lbs lost to date: 21     08/10/2019  Height 5\' 8"  (1.727 m)  Weight 309 lb (140.2 kg) (A)  BMI (Calculated) 46.99  BLOOD PRESSURE - SYSTOLIC [id number]  BLOOD PRESSURE - DIASTOLIC 74   Body Fat % [id number] %   ASK: We discussed the diagnosis of obesity with Debbe Mounts today and Ivin Booty agreed to give [name] permission to discuss obesity behavioral modification therapy today.  ASSESS: Manetta has the diagnosis of obesity and her BMI today is 47.0. Ashlynne is in the action stage of change.   ADVISE: Mykailah was educated on the multiple health risks of obesity as well as the benefit of weight loss to improve her health. She was advised of the need for long term treatment and the importance of lifestyle modifications to improve her current health and to decrease her risk of future health problems.  AGREE: Multiple dietary modification options and treatment options were discussed and  Makenli agreed to follow the recommendations documented in the above note.  ARRANGE: Esmarie was educated on the importance of frequent visits to treat obesity as outlined per CMS and USPSTF guidelines and agreed to schedule her next follow up appointment today.  I, Michaelene Song, am acting as Location manager for Dennard Nip, MD I have reviewed the above documentation for accuracy and completeness, and I agree with the above. -Dennard Nip, MD

## 2019-08-11 LAB — COMPREHENSIVE METABOLIC PANEL
ALT: 11 IU/L (ref 0–32)
AST: 11 IU/L (ref 0–40)
Albumin/Globulin Ratio: 1.4 (ref 1.2–2.2)
Albumin: 4 g/dL (ref 3.8–4.9)
Alkaline Phosphatase: 91 IU/L (ref 39–117)
BUN/Creatinine Ratio: 19 (ref 9–23)
BUN: 10 mg/dL (ref 6–24)
Bilirubin Total: <0.2 mg/dL (ref 0.0–1.2)
CO2: 25 mmol/L (ref 20–29)
Calcium: 9.5 mg/dL (ref 8.7–10.2)
Chloride: 103 mmol/L (ref 96–106)
Creatinine, Ser: 0.54 mg/dL — ABNORMAL LOW (ref 0.57–1.00)
GFR calc Af Amer: 120 mL/min/{1.73_m2} (ref >59)
GFR calc non Af Amer: 104 mL/min/{1.73_m2} (ref >59)
Globulin, Total: 2.8 g/dL (ref 1.5–4.5)
Glucose: 132 mg/dL — ABNORMAL HIGH (ref 65–99)
Potassium: 4.5 mmol/L (ref 3.5–5.2)
Sodium: 141 mmol/L (ref 134–144)
Total Protein: 6.8 g/dL (ref 6.0–8.5)

## 2019-08-11 LAB — VITAMIN D 25 HYDROXY (VIT D DEFICIENCY, FRACTURES): Vit D, 25-Hydroxy: 43.5 ng/mL (ref 30.0–100.0)

## 2019-08-11 LAB — HEMOGLOBIN A1C
Est. average glucose Bld gHb Est-mCnc: 166 mg/dL
Hgb A1c MFr Bld: 7.4 % — ABNORMAL HIGH (ref 4.8–5.6)

## 2019-08-11 LAB — SPECIMEN STATUS REPORT

## 2019-08-11 LAB — INSULIN, RANDOM: INSULIN: 22.7 u[IU]/mL (ref 2.6–24.9)

## 2019-08-24 ENCOUNTER — Other Ambulatory Visit: Payer: Self-pay

## 2019-08-24 ENCOUNTER — Encounter (INDEPENDENT_AMBULATORY_CARE_PROVIDER_SITE_OTHER): Payer: Self-pay | Admitting: Family Medicine

## 2019-08-24 ENCOUNTER — Telehealth (INDEPENDENT_AMBULATORY_CARE_PROVIDER_SITE_OTHER): Payer: 59 | Admitting: Family Medicine

## 2019-08-24 DIAGNOSIS — Z9189 Other specified personal risk factors, not elsewhere classified: Secondary | ICD-10-CM | POA: Diagnosis not present

## 2019-08-24 DIAGNOSIS — E559 Vitamin D deficiency, unspecified: Secondary | ICD-10-CM | POA: Diagnosis not present

## 2019-08-24 DIAGNOSIS — Z6841 Body Mass Index (BMI) 40.0 and over, adult: Secondary | ICD-10-CM

## 2019-08-24 DIAGNOSIS — E1165 Type 2 diabetes mellitus with hyperglycemia: Secondary | ICD-10-CM

## 2019-08-24 MED ORDER — VITAMIN D (ERGOCALCIFEROL) 1.25 MG (50000 UNIT) PO CAPS: 50000.0000 [IU] | ORAL_CAPSULE | ORAL | 0 refills | Status: DC

## 2019-08-24 MED ORDER — RYBELSUS 7 MG PO TABS: 7.0000 mg | ORAL_TABLET | Freq: Every day | ORAL | 0 refills | Status: DC

## 2019-08-25 NOTE — Progress Notes (Signed)
Office: [phone]  /  Fax: [phone] TeleHealth Visit:  Erin [name] has verbally consented to this TeleHealth visit today. The patient is located at home, the provider is located at the [facility] office. The participants in this visit include the listed provider and patient. The visit was conducted today via telephone call (AV failed - changed to telephone call).  HPI:  Chief Complaint: OBESITY Erin [name] is here to discuss her progress with her obesity treatment plan. She is on the Category 3 plan and states she is following her eating plan approximately 75% of the time. She states she is exercising 0 minutes 0 times per week.  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 She feels she has lost another 1-2 lbs and is doing well on her eating plan overall.  Today's visit was #21 Starting weight: 330 lbs Starting date: 07/16/2018   Diabetes Mellitus type II Erin [name]'s A1c improved from 8.0 on 04/14/2019 to 7.4 on 08/10/2019 with diet and medication. She denies nausea, vomiting, or hypoglycemia. She states she is tolerating Rybelsus well.  Vitamin D deficiency Erin [name] has a diagnosis of Vitamin D deficiency and is stable on prescription Vitamin D. Last Vitamin D was 43.5 on 08/10/2019, which is almost at goal. No nausea, vomiting, or muscle weakness.  ASSESSMENT AND PLAN:  Type 2 diabetes mellitus with hyperglycemia, without long-term current use of insulin ([facility]) - Plan: Semaglutide (RYBELSUS) 7 MG TABS  Vitamin D deficiency - Plan: Vitamin D, Ergocalciferol, (DRISDOL) 1.25 MG (50000 UT) CAPS capsule  At risk for heart disease  Class 3 severe obesity with serious comorbidity and body mass index (BMI) of 45.0 to 49.9 in adult, unspecified obesity type ([place])  PLAN:  Diabetes II Erin [name] has been given diabetes education by myself today. Good blood sugar control is  important to decrease the likelihood of diabetic complications such as nephropathy, neuropathy, limb loss, blindness, coronary artery disease, and death. Intensive lifestyle modification including diet, exercise and weight loss were discussed as the first line treatment for diabetes. Erin [name] was given a refill on her Rybelsus 7 mg #30 with 0 refills and agrees to follow-up with our clinic in 3-4 weeks.  Vitamin D Deficiency Erin [name] was informed that low Vitamin D levels contributes to fatigue and are associated with obesity, breast, and colon cancer. She agrees to continue to take prescription Vit D @ 50,000 IU every 14 days #2 with 0 refills and will follow-up for routine testing of Vitamin D, at least 2-3 times per year. She was informed of the risk of over-replacement of Vitamin D and agrees to not increase her dose unless she discusses this with [name] first. [name] agrees to follow-up with our clinic in 3-4 weeks.  Obesity Erin [name] is currently in the action stage of change. As such, her goal is to continue with weight loss efforts. She has agreed to follow the Category 3 plan. Erin [name] will work up to a goal of 150 minutes of combined cardio and strengthening exercise per week for weight loss and overall health benefits. We discussed the following Behavioral Modification Strategies today: holiday eating strategies.   Erin [name] has agreed to follow-up with our clinic in 3-4 weeks. She was informed of the importance of frequent follow-up visits to maximize her success with intensive lifestyle modifications for her multiple health conditions.  ALLERGIES: No Known Allergies  MEDICATIONS: Current Outpatient Medications  on File Prior to Visit  Medication Sig Dispense Refill  . aspirin 81 MG tablet Take 81 mg by mouth daily.    . calcium acetate (PHOSLO) 667 MG capsule Take 667 mg by mouth 1 day or 1 dose.    . Cholecalciferol (VITAMIN D-3 PO) Take 1,000 Units by mouth daily.     [name] ibuprofen (ADVIL,MOTRIN)  800 MG tablet TAKE 1 TABLET(S) BY MOUTH 2 TIMES A DAY AS NEEDED [PRN]  3  . losartan (COZAAR) 50 MG tablet Take 50 mg by mouth daily.  5  . metFORMIN (GLUCOPHAGE-XR) 500 MG 24 hr tablet Take 1,000 mg by mouth 2 (two) times daily.  5  . metoprolol tartrate (LOPRESSOR) 25 MG tablet TAKE 1 TABLET BY MOUTH TWICE A DAY 180 tablet 1  . Multiple Vitamin (MULTIVITAMIN) capsule Take 1 capsule by mouth daily.    . rosuvastatin (CRESTOR) 10 MG tablet TAKE 1 TABLET (10 MG TOTAL) BY MOUTH DAILY. 90 tablet 0  . vitamin C (ASCORBIC ACID) 500 MG tablet Take 500 mg by mouth daily.     No current facility-administered medications on file prior to visit.    PAST MEDICAL HISTORY: Past Medical History:  Diagnosis Date  . Anemia   . Atypical chest pain 06/13/2016  . Back pain   . Breast nodule 2009   Right   . Diabetes mellitus   . Fibroid    Symptomatic  . H/O dysmenorrhea   . H/O hematuria   . H/O hypercholesterolemia   . H/O: menorrhagia   . H/O: obesity   . HPV in female   . Hypertension   . Microhematuria 2012  . Shortness of breath 06/13/2016  . Yeast vaginitis 2008    PAST SURGICAL HISTORY: Past Surgical History:  Procedure Laterality Date  . ABDOMINAL HYSTERECTOMY    . HYSTEROSCOPY W/D&C  2003  . POLYPECTOMY  2008  . TUBAL LIGATION  1984   Bilateral    SOCIAL HISTORY: Social History   Tobacco Use  . Smoking status: Never Smoker  . Smokeless tobacco: Never Used  Substance Use Topics  . Alcohol use: No  . Drug use: No    FAMILY HISTORY: Family History  Problem Relation Age of Onset  . Diabetes Mother   . Valvular heart disease Mother   . Diabetes Brother   . Hypertension Father   . Stroke Father    ROS: Review of Systems  Gastrointestinal: Negative for nausea and vomiting.  Musculoskeletal:       Negative for muscle weakness.  Endo/Heme/Allergies:       Negative for hypoglycemia.   PHYSICAL EXAM: There were no vitals taken for this visit. There is no height or  weight on file to calculate BMI. Physical Exam: Pt in no acute distress.  RECENT LABS AND TESTS: BMET    Component Value Date/Time   NA 141 08/10/2019 0000   NA 141 12/20/2016 1003   K 4.5 08/10/2019 0000   K 4.1 12/20/2016 1003   CL 103 08/10/2019 0000   CO2 25 08/10/2019 0000   CO2 28 12/20/2016 1003   GLUCOSE 132 (H) 08/10/2019 0000   GLUCOSE 139 12/20/2016 1003   BUN 10 08/10/2019 0000   BUN 10.1 12/20/2016 1003   CREATININE 0.54 (L) 08/10/2019 0000   CREATININE 0.7 12/20/2016 1003   CALCIUM 9.5 08/10/2019 0000   CALCIUM 9.5 12/20/2016 1003   GFRNONAA 104 08/10/2019 0000   GFRAA 120 08/10/2019 0000   Lab Results  Component Value  Date   HGBA1C 7.4 (H) 08/10/2019   HGBA1C 8.0 (H) 04/14/2019   HGBA1C 8.6 (H) 07/16/2018   Lab Results  Component Value Date   INSULIN 22.7 08/10/2019   INSULIN 16.3 04/14/2019   INSULIN 24.2 07/16/2018   CBC    Component Value Date/Time   WBC 7.4 07/16/2018 1235   WBC 8.4 01/15/2018 0954   WBC 7.2 12/20/2016 1003   RBC 4.52 07/16/2018 1235   RBC 4.22 01/15/2018 0954   RBC 4.22 01/15/2018 0954   HGB 11.1 07/16/2018 1235   HGB 10.9 (L) 12/20/2016 1003   HCT 35.5 07/16/2018 1235   HCT 34.8 12/20/2016 1003   PLT 312 01/15/2018 0954   PLT 317 12/20/2016 1003   MCV 79 07/16/2018 1235   MCV 80.6 12/20/2016 1003   MCH 24.6 (L) 07/16/2018 1235   MCH 25.6 01/15/2018 0954   MCHC 31.3 (L) 07/16/2018 1235   MCHC 31.2 (L) 01/15/2018 0954   RDW 14.1 07/16/2018 1235   RDW 14.9 (H) 12/20/2016 1003   LYMPHSABS 2.2 07/16/2018 1235   LYMPHSABS 2.0 12/20/2016 1003   MONOABS 0.5 01/15/2018 0954   MONOABS 0.5 12/20/2016 1003   EOSABS 0.2 07/16/2018 1235   BASOSABS 0.0 07/16/2018 1235   BASOSABS 0.0 12/20/2016 1003   Iron/TIBC/Ferritin/ %Sat    Component Value Date/Time   IRON 38 (L) 12/20/2016 1003   TIBC 244 12/20/2016 1003   FERRITIN 590 (H) 12/20/2016 1003   IRONPCTSAT 16 (L) 12/20/2016 1003   Lipid Panel     Component Value  Date/Time   CHOL 134 04/14/2019 1544   TRIG 95 04/14/2019 1544   HDL 47 04/14/2019 1544   LDLCALC 68 04/14/2019 1544   Hepatic Function Panel     Component Value Date/Time   PROT 6.8 08/10/2019 0000   PROT 7.0 12/20/2016 1003   ALBUMIN 4.0 08/10/2019 0000   ALBUMIN 3.7 12/20/2016 1003   AST 11 08/10/2019 0000   AST 15 12/20/2016 1003   ALT 11 08/10/2019 0000   ALT 13 12/20/2016 1003   ALKPHOS 91 08/10/2019 0000   ALKPHOS 87 12/20/2016 1003   BILITOT <0.2 08/10/2019 0000   BILITOT 0.49 12/20/2016 1003      Component Value Date/Time   TSH 3.690 07/16/2018 1235   TSH 5.22 (H) 06/13/2016 0918   TSH 1.960 03/08/2015 1257      I, Michaelene Song, am acting as Location manager for Dennard Nip, MD I have reviewed the above documentation for accuracy and completeness, and I agree with the above. -Dennard Nip, MD

## 2019-08-29 ENCOUNTER — Other Ambulatory Visit (INDEPENDENT_AMBULATORY_CARE_PROVIDER_SITE_OTHER): Payer: Self-pay | Admitting: Family Medicine

## 2019-08-29 DIAGNOSIS — E559 Vitamin D deficiency, unspecified: Secondary | ICD-10-CM

## 2019-08-31 ENCOUNTER — Other Ambulatory Visit: Payer: Self-pay | Admitting: Cardiovascular Disease

## 2019-09-15 ENCOUNTER — Other Ambulatory Visit: Payer: Self-pay

## 2019-09-15 ENCOUNTER — Encounter (INDEPENDENT_AMBULATORY_CARE_PROVIDER_SITE_OTHER): Payer: Self-pay | Admitting: Family Medicine

## 2019-09-15 ENCOUNTER — Telehealth (INDEPENDENT_AMBULATORY_CARE_PROVIDER_SITE_OTHER): Payer: 59 | Admitting: Family Medicine

## 2019-09-15 DIAGNOSIS — E1165 Type 2 diabetes mellitus with hyperglycemia: Secondary | ICD-10-CM | POA: Diagnosis not present

## 2019-09-15 DIAGNOSIS — Z6841 Body Mass Index (BMI) 40.0 and over, adult: Secondary | ICD-10-CM

## 2019-09-15 MED ORDER — RYBELSUS 7 MG PO TABS: 7.0000 mg | ORAL_TABLET | Freq: Every day | ORAL | 0 refills | Status: DC

## 2019-09-18 ENCOUNTER — Other Ambulatory Visit: Payer: Self-pay | Admitting: Cardiovascular Disease

## 2019-09-20 NOTE — Progress Notes (Signed)
TeleHealth Visit:  Due to the COVID-19 pandemic, this visit was completed with telemedicine (audio/video) technology to reduce patient and provider exposure as well as to preserve personal protective equipment.   Erin [name] has verbally consented to this TeleHealth visit. The patient is located at home, the provider is located at the [facility] office. The participants in this visit include the listed provider and patient. The visit was conducted today via face time.   Chief Complaint: Erin [name] is here to discuss her progress with her obesity treatment plan along with follow-up of her obesity related diagnoses. Erin [name] is on the Category 3 Plan and states she is following her eating plan approximately 50% of the time. Erin [name] states she is walking 10,000-17,000 steps 7 times per week.  Today's visit was #: 22 Starting weight: 330 lbs Starting date: 07/16/18  Interim History: Erin [name] states she has done well maintaining her weight over the holidays. Her hunger is controlled and she is ready to get back on track. She has a new job and is more active at work, and she easily meets her 10,000 step goals now.  Subjective:   1. Type 2 diabetes mellitus with hyperglycemia, without long-term current use of insulin ([place]) [name] states her fasting BGs average between 115 and 117's, and her PM blood sugars average in the 90's. She is tolerating Rybelsus well and denies nausea. Last A1c improved at 7.4 and it is getting closer to goal. I discussed labs with the patient today.  Assessment/Plan:   1. Type 2 diabetes mellitus with hyperglycemia, without long-term current use of insulin (HCC) We will refill Rybelsus for 1 month- Semaglutide (RYBELSUS) 7 MG TABS; Take 7 mg by mouth daily. Dispense: 30 tablet; Refill: 0. [name] will continue her diet and we will recheck labs in 2 months.  2. Class 3 severe obesity with serious comorbidity and body mass index (BMI) of 45.0 to  49.9 in adult, unspecified obesity type [facility]) Erin [name] is currently in the action stage of change. As such, her goal is to continue with weight loss efforts. She has agreed to on the Category 3 Plan.   We discussed the following exercise goals today: Erin [name] is to continue her current exercise regimen as is.  We discussed the following behavioral modification strategies today: meal planning and cooking strategies and planning for success.  Erin [name] has agreed to follow-up with our clinic in 2 to 3 weeks. She was informed of the importance of frequent follow-up visits to maximize her success with intensive lifestyle modifications for her multiple health conditions.  Objective:   VITALS: Per patient if applicable, see vitals. GENERAL: Alert and in no acute distress. CARDIOPULMONARY: No increased WOB. Speaking in clear sentences.  PSYCH: Pleasant and cooperative. Speech normal rate and rhythm. Affect is appropriate. Insight and judgement are appropriate. Attention is focused, linear, and appropriate.  NEURO: Oriented as arrived to appointment on time with no prompting.   Lab Results  Component Value Date   CREATININE 0.54 (L) 08/10/2019   BUN 10 08/10/2019   NA 141 08/10/2019   K 4.5 08/10/2019   CL 103 08/10/2019   CO2 25 08/10/2019   Lab Results  Component Value Date   ALT 11 08/10/2019   AST 11 08/10/2019   ALKPHOS 91 08/10/2019   BILITOT <0.2 08/10/2019   Lab Results  Component Value Date   HGBA1C 7.4 (H) 08/10/2019   HGBA1C 8.0 (H) 04/14/2019   HGBA1C 8.6 (  H) 07/16/2018   Lab Results  Component Value Date   INSULIN 22.7 08/10/2019   INSULIN 16.3 04/14/2019   INSULIN 24.2 07/16/2018   Lab Results  Component Value Date   TSH 3.690 07/16/2018   Lab Results  Component Value Date   CHOL 134 04/14/2019   HDL 47 04/14/2019   LDLCALC 68 04/14/2019   TRIG 95 04/14/2019   Lab Results  Component Value Date   WBC 7.4 07/16/2018   HGB 11.1 07/16/2018   HCT 35.5  07/16/2018   MCV 79 07/16/2018   PLT 312 01/15/2018   Lab Results  Component Value Date   IRON 38 (L) 12/20/2016   TIBC 244 12/20/2016   FERRITIN 590 (H) 12/20/2016    Attestation Statements:   Reviewed by clinician on day of visit: allergies, medications, problem list, medical history, surgical history, family history, social history, and previous encounter notes.   I, Nikkia Ruotolo, am acting as transcriptionist for Dennard Nip, MD.  I have reviewed the above documentation for accuracy and completeness, and I agree with the above. - Dennard Nip, MD

## 2019-10-04 ENCOUNTER — Ambulatory Visit (INDEPENDENT_AMBULATORY_CARE_PROVIDER_SITE_OTHER): Payer: 59 | Admitting: Family Medicine

## 2019-10-06 ENCOUNTER — Ambulatory Visit (INDEPENDENT_AMBULATORY_CARE_PROVIDER_SITE_OTHER): Payer: 59 | Admitting: Family Medicine

## 2019-10-06 ENCOUNTER — Other Ambulatory Visit: Payer: Self-pay

## 2019-10-06 ENCOUNTER — Encounter (INDEPENDENT_AMBULATORY_CARE_PROVIDER_SITE_OTHER): Payer: Self-pay | Admitting: Family Medicine

## 2019-10-06 VITALS — BP 131/80 | HR 68 | Temp 98.0°F | Ht 68.0 in | Wt 307.0 lb

## 2019-10-06 DIAGNOSIS — Z9189 Other specified personal risk factors, not elsewhere classified: Secondary | ICD-10-CM | POA: Diagnosis not present

## 2019-10-06 DIAGNOSIS — E559 Vitamin D deficiency, unspecified: Secondary | ICD-10-CM | POA: Diagnosis not present

## 2019-10-06 DIAGNOSIS — Z6841 Body Mass Index (BMI) 40.0 and over, adult: Secondary | ICD-10-CM

## 2019-10-06 DIAGNOSIS — E1165 Type 2 diabetes mellitus with hyperglycemia: Secondary | ICD-10-CM

## 2019-10-06 MED ORDER — VITAMIN D (ERGOCALCIFEROL) 1.25 MG (50000 UNIT) PO CAPS: 50000.0000 [IU] | ORAL_CAPSULE | ORAL | 0 refills | Status: DC

## 2019-10-06 MED ORDER — RYBELSUS 7 MG PO TABS: 7.0000 mg | ORAL_TABLET | Freq: Every day | ORAL | 0 refills | Status: DC

## 2019-10-06 NOTE — Progress Notes (Signed)
Chief Complaint:   OBESITY Erin [name] is here to discuss her progress with her obesity treatment plan along with follow-up of her obesity related diagnoses. Erin [name] is on the Category 3 Plan and states she is following her eating plan approximately 30% of the time. Erin [name] states she is walking at work.  Today's visit was #: 23 Starting weight: 330 lbs Starting date: 07/16/18 Today's weight: 307 lbs Today's date: 10/06/2019 Total lbs lost to date: 23 Total lbs lost since last in-office visit: 2  Interim History: [name] continues to do well with weight loss. She is working 7 days a week, and averaging 4-5 hours of sleep at night, which has likely decreased her RMR. She is active at work, but no formal exercise.  Subjective:   1. Type 2 diabetes mellitus with hyperglycemia, without long-term current use of insulin (HCC) Erin [name]'s last A1c has improved from 8.0 to 7.4.  2. Vitamin D deficiency Erin [name]'s last Vit D level is note yet at goal. She is tolerating Vit D prescription well.  3. At risk for heart disease Erin [name] is at a higher than average risk for cardiovascular disease due to decreased sleep. Reviewed: no chest pain on exertion, no dyspnea on exertion, and no swelling of ankles.  Assessment/Plan:   1. Type 2 diabetes mellitus with hyperglycemia, without long-term current use of insulin (HCC) Good blood sugar control is important to decrease the likelihood of diabetic complications such as nephropathy, neuropathy, limb loss, blindness, coronary artery disease, and death. Intensive lifestyle modification including diet, exercise and weight loss are the first line of treatment for diabetes. We will refill Rybelsus for 1 month, and will continue to monitor.  - Semaglutide (RYBELSUS) 7 MG TABS; Take 7 mg by mouth daily.  Dispense: 30 tablet; Refill: 0  2. Vitamin D deficiency Low Vitamin D level contributes to fatigue and are associated with obesity, breast, and colon cancer. We will  refill prescription Vitamin D for 1 month. Erin [name] will follow-up for routine testing of Vitamin D, at least 2-3 times per year to avoid over-replacement.  - Vitamin D, Ergocalciferol, (DRISDOL) 1.25 MG (50000 UNIT) CAPS capsule; Take 1 capsule (50,000 Units total) by mouth every 14 (fourteen) days.  Dispense: 2 capsule; Refill: 0  3. At risk for heart disease Erin [name] was given approximately 15 minutes of coronary artery disease prevention counseling today. She is 59 y.o. female and has risk factors for heart disease including obesity. We discussed intensive lifestyle modifications today with an emphasis on specific weight loss instructions and strategies. Erin [name]'s goal is to increase sleep to at least 6-7 hours daily.  Repetitive spaced learning was employed today to elicit superior memory formation and behavioral change.  4. Class 3 severe obesity with serious comorbidity and body mass index (BMI) of 45.0 to 49.9 in adult, unspecified obesity type [facility]) Erin [name] is currently in the action stage of change. As such, her goal is to continue with weight loss efforts. She has agreed to the Category 3 Plan.   Behavioral modification strategies: no skipping meals and meal planning and cooking strategies.  Erin [name] has agreed to follow-up with our clinic in 4 weeks. She was informed of the importance of frequent follow-up visits to maximize her success with intensive lifestyle modifications for her multiple health conditions.   Objective:   Blood pressure 131/80, pulse 68, temperature 98 F (36.7 C), temperature source Oral, height 5\' 8"  (1.727 m), weight (!) 307 lb (139.3 kg), SpO2 100 %. Body mass index is  46.68 kg/m.  General: Cooperative, alert, well developed, in no acute distress. HEENT: Conjunctivae and lids unremarkable. Cardiovascular: Regular rhythm.  Lungs: Normal work of breathing. Neurologic: No focal deficits.   Lab Results  Component Value Date   CREATININE 0.54 (L) 08/10/2019    BUN 10 08/10/2019   NA 141 08/10/2019   K 4.5 08/10/2019   CL 103 08/10/2019   CO2 25 08/10/2019   Lab Results  Component Value Date   ALT 11 08/10/2019   AST 11 08/10/2019   ALKPHOS 91 08/10/2019   BILITOT <0.2 08/10/2019   Lab Results  Component Value Date   HGBA1C 7.4 (H) 08/10/2019   HGBA1C 8.0 (H) 04/14/2019   HGBA1C 8.6 (H) 07/16/2018   Lab Results  Component Value Date   INSULIN 22.7 08/10/2019   INSULIN 16.3 04/14/2019   INSULIN 24.2 07/16/2018   Lab Results  Component Value Date   TSH 3.690 07/16/2018   Lab Results  Component Value Date   CHOL 134 04/14/2019   HDL 47 04/14/2019   LDLCALC 68 04/14/2019   TRIG 95 04/14/2019   Lab Results  Component Value Date   WBC 7.4 07/16/2018   HGB 11.1 07/16/2018   HCT 35.5 07/16/2018   MCV 79 07/16/2018   PLT 312 01/15/2018   Lab Results  Component Value Date   IRON 38 (L) 12/20/2016   TIBC 244 12/20/2016   FERRITIN 590 (H) 12/20/2016   Attestation Statements:   Reviewed by clinician on day of visit: allergies, medications, problem list, medical history, surgical history, family history, social history, and previous encounter notes.   I, Kristabella Turpen, am acting as transcriptionist for Dennard Nip, MD.  I have reviewed the above documentation for accuracy and completeness, and I agree with the above. -  Dennard Nip, MD

## 2019-11-01 ENCOUNTER — Ambulatory Visit (INDEPENDENT_AMBULATORY_CARE_PROVIDER_SITE_OTHER): Payer: 59 | Admitting: Family Medicine

## 2019-11-01 ENCOUNTER — Other Ambulatory Visit: Payer: Self-pay

## 2019-11-01 ENCOUNTER — Encounter (INDEPENDENT_AMBULATORY_CARE_PROVIDER_SITE_OTHER): Payer: Self-pay | Admitting: Family Medicine

## 2019-11-01 VITALS — BP 138/76 | HR 60 | Temp 98.0°F | Ht 68.0 in | Wt 309.0 lb

## 2019-11-01 DIAGNOSIS — Z6841 Body Mass Index (BMI) 40.0 and over, adult: Secondary | ICD-10-CM

## 2019-11-01 DIAGNOSIS — E119 Type 2 diabetes mellitus without complications: Secondary | ICD-10-CM

## 2019-11-01 NOTE — Progress Notes (Signed)
Chief Complaint:   OBESITY Erin [name] is here to discuss her progress with her obesity treatment plan along with follow-up of her obesity related diagnoses. Erin [name] is on the Category 3 Plan and states she is following her eating plan approximately 50% of the time. Erin [name] states she is walking at work 7 times per week.  Today's visit was #: 24 Starting weight: 330 lbs Starting date: 07/16/2018 Today's weight: 309 lbs Today's date: 11/01/2019 Total lbs lost to date: 21 lbs Total lbs lost since last in-office visit: 0  Interim History: Erin [name] reports that she has been working long hours recently and has been struggling with meal planning.  She has been doing more grab and go eating and is trying to make better choices.  Subjective:   1. Type 2 diabetes mellitus without complication, without long-term current use of insulin (Erin [name]) Erin [name] states that her fasting blood sugars are mostly in the 120s.  Highest postprandial blood sugar is 178.  She has been getting better control with diet and weight loss.  No hypoglycemia.  Lab Results  Component Value Date   HGBA1C 7.4 (H) 08/10/2019   HGBA1C 8.0 (H) 04/14/2019   HGBA1C 8.6 (H) 07/16/2018   Lab Results  Component Value Date   LDLCALC 68 04/14/2019   CREATININE 0.54 (L) 08/10/2019   Lab Results  Component Value Date   INSULIN 22.7 08/10/2019   INSULIN 16.3 04/14/2019   INSULIN 24.2 07/16/2018   Assessment/Plan:   1. Type 2 diabetes mellitus without complication, without long-term current use of insulin (HCC) Good blood sugar control is important to decrease the likelihood of diabetic complications such as nephropathy, neuropathy, limb loss, blindness, coronary artery disease, and death. Intensive lifestyle modification including diet, exercise and weight loss are the first line of treatment for diabetes.  She will continue Rybelsus and metformin.  2. Class 3 severe obesity with serious comorbidity and body mass index (BMI) of 45.0  to 49.9 in adult, unspecified obesity type [facility]) Erin [name] is currently in the action stage of change. As such, her goal is to continue with weight loss efforts. She has agreed to the Category 3 Plan and keeping a food journal and adhering to recommended goals of 400-600 calories and 40+ grams of protein at supper.   Exercise goals: As is.  Behavioral modification strategies: increasing lean protein intake.  Erin [name] has agreed to follow-up with our clinic in 3 weeks. She was informed of the importance of frequent follow-up visits to maximize her success with intensive lifestyle modifications for her multiple health conditions.   Objective:   Blood pressure 138/76, pulse 60, temperature 98 F (36.7 C), temperature source Oral, height 5\' 8"  (1.727 m), weight (!) 309 lb (140.2 kg), SpO2 99 %. Body mass index is 46.98 kg/m.  General: Cooperative, alert, well developed, in no acute distress. HEENT: Conjunctivae and lids unremarkable. Cardiovascular: Regular rhythm.  Lungs: Normal work of breathing. Neurologic: No focal deficits.   Lab Results  Component Value Date   CREATININE 0.54 (L) 08/10/2019   BUN 10 08/10/2019   NA 141 08/10/2019   K 4.5 08/10/2019   CL 103 08/10/2019   CO2 25 08/10/2019   Lab Results  Component Value Date   ALT 11 08/10/2019   AST 11 08/10/2019   ALKPHOS 91 08/10/2019   BILITOT <0.2 08/10/2019   Lab Results  Component Value Date   HGBA1C 7.4 (H) 08/10/2019   HGBA1C 8.0 (H) 04/14/2019   HGBA1C 8.6 (H) 07/16/2018  Lab Results  Component Value Date   INSULIN 22.7 08/10/2019   INSULIN 16.3 04/14/2019   INSULIN 24.2 07/16/2018   Lab Results  Component Value Date   TSH 3.690 07/16/2018   Lab Results  Component Value Date   CHOL 134 04/14/2019   HDL 47 04/14/2019   LDLCALC 68 04/14/2019   TRIG 95 04/14/2019   Lab Results  Component Value Date   WBC 7.4 07/16/2018   HGB 11.1 07/16/2018   HCT 35.5 07/16/2018   MCV 79 07/16/2018   PLT 312  01/15/2018   Lab Results  Component Value Date   IRON 38 (L) 12/20/2016   TIBC 244 12/20/2016   FERRITIN 590 (H) 12/20/2016   Attestation Statements:   Reviewed by clinician on day of visit: allergies, medications, problem list, medical history, surgical history, family history, social history, and previous encounter notes.  Time spent on visit including pre-visit chart review and post-visit care was 31 minutes.   I, Water quality scientist, CMA, am acting as transcriptionist for Dennard Nip, MD.  I have reviewed the above documentation for accuracy and completeness, and I agree with the above. -  Dennard Nip, MD

## 2019-11-09 ENCOUNTER — Encounter: Payer: Self-pay | Admitting: Orthopaedic Surgery

## 2019-11-09 ENCOUNTER — Ambulatory Visit (INDEPENDENT_AMBULATORY_CARE_PROVIDER_SITE_OTHER): Payer: Worker's Compensation | Admitting: Orthopaedic Surgery

## 2019-11-09 ENCOUNTER — Other Ambulatory Visit: Payer: Self-pay

## 2019-11-09 VITALS — BP 137/79 | HR 68 | Ht 68.0 in | Wt 313.8 lb

## 2019-11-09 DIAGNOSIS — M545 Low back pain, unspecified: Secondary | ICD-10-CM

## 2019-11-09 DIAGNOSIS — E1165 Type 2 diabetes mellitus with hyperglycemia: Secondary | ICD-10-CM | POA: Diagnosis not present

## 2019-11-09 NOTE — Progress Notes (Addendum)
Office Visit Note   Patient: Erin [name]           Date of Birth: 18-Jul-1961           MRN: [id number] Visit Date: 11/09/2019              Requested by: No referring provider defined for this encounter. PCP: Darcus Austin, MD (Inactive)   Assessment & Plan: Visit Diagnoses:  1. Low back pain, unspecified back pain laterality, unspecified chronicity, unspecified whether sciatica present     Plan: Patient wants to continue to work on weight loss and improvement so she can make it to her retirement goal.   She has some anterolisthesis at L4-5 and slight retrolisthesis at L2-3 on previous plain radiographs.  We will set her up for some physical therapy for treatment and I plan to recheck her back again in 6 weeks.  If she does not respond to therapy she may require new lumbar MRI scan.  Follow-Up Instructions: Return in about 6 weeks (around 12/21/2019).   Orders:  Orders Placed This Encounter  Procedures  . Ambulatory referral to Physical Therapy   No orders of the defined types were placed in this encounter.     Procedures: No procedures performed   Clinical Data: No additional findings.   Subjective: Chief Complaint  Patient presents with  . Lower Back - Pain, Follow-up    HPI 59 year old female returns with ongoing problems with chronic low back pain.  She states she is having work 7 days a week with the [employer] working overtime.  She has had muscle spasms in her back.  She has had problems with tendon problems in her ankle most likely posterior tibial problem and this is followed by Dr. Doran Durand at [facility].  Patient states if she is on her feet a lot she feels like her left leg wants to buckle or give out.  She denies numbness or tingling in her feet.  In the past her A1c was 8 or greater slowly come down to 7.2 and now it is in the 6 range.  She is used Advil for her pain symptoms.  She is been working on weight loss.  Current BMI 47.7.  Patient not  associated bowel bladder symptoms.  She gets better with resting.  With our she is working she has not really had time for regular exercise program.  Old MRI lumbar 2007 showed extraforaminal disc protrusion L2-3 on the left and mild annular bulging L3-4 and L4-5.  Review of Systems 14 point update unchanged from 03/10/2019 office visit other than as mentioned in HPI.   Objective: Vital Signs: BP 137/79   Pulse 68   Ht 5\' 8"  (1.727 m)   Wt (!) 313 lb 12.8 oz (142.3 kg)   BMI 47.71 kg/m   Physical Exam Constitutional:      Appearance: She is well-developed.  HENT:     Head: Normocephalic.     Right Ear: External ear normal.     Left Ear: External ear normal.  Eyes:     Pupils: Pupils are equal, round, and reactive to light.  Neck:     Thyroid: No thyromegaly.     Trachea: No tracheal deviation.  Cardiovascular:     Rate and Rhythm: Normal rate.  Pulmonary:     Effort: Pulmonary effort is normal.  Abdominal:     Palpations: Abdomen is soft.  Skin:    General: Skin is warm and dry.  Neurological:  Mental Status: She is alert and oriented to person, place, and time.  Psychiatric:        Behavior: Behavior normal.     Ortho Exam patient gets from sitting to standing she ambulates with slow deliberate gait.  She is able to heel and toe walk.  Negative logroll of the hips.  Some buttocks trochanteric and thigh discomfort with straight leg raising on the left at 90 degrees.  Knee and ankle jerk are intact.  Specialty Comments:  No specialty comments available.  Imaging: No results found.   PMFS History: Patient Active Problem List   Diagnosis Date Noted  . Low back pain 05/11/2019  . Morbid (severe) obesity due to excess calories ([place]) 11/25/2016  . Shortness of breath 06/13/2016  . Atypical chest pain 06/13/2016  . Anemia of chronic disease 03/08/2015  . High blood pressure 07/22/2012  . Diabetes mellitus ([place]) 07/22/2012   Past Medical History:  Diagnosis Date   . Anemia   . Atypical chest pain 06/13/2016  . Back pain   . Breast nodule 2009   Right   . Diabetes mellitus   . Fibroid    Symptomatic  . H/O dysmenorrhea   . H/O hematuria   . H/O hypercholesterolemia   . H/O: menorrhagia   . H/O: obesity   . HPV in female   . Hypertension   . Microhematuria 2012  . Shortness of breath 06/13/2016  . Yeast vaginitis 2008    Family History  Problem Relation Age of Onset  . Diabetes Mother   . Valvular heart disease Mother   . Diabetes Brother   . Hypertension Father   . Stroke Father     Past Surgical History:  Procedure Laterality Date  . ABDOMINAL HYSTERECTOMY    . HYSTEROSCOPY WITH D & C  2003  . POLYPECTOMY  2008  . TUBAL LIGATION  1984   Bilateral   Social History   Occupational History  . Occupation: Glass blower/designer  Tobacco Use  . Smoking status: Never Smoker  . Smokeless tobacco: Never Used  Substance and Sexual Activity  . Alcohol use: No  . Drug use: No  . Sexual activity: Never    Comment: hysterectomy

## 2019-11-20 ENCOUNTER — Other Ambulatory Visit (INDEPENDENT_AMBULATORY_CARE_PROVIDER_SITE_OTHER): Payer: Self-pay | Admitting: Family Medicine

## 2019-11-20 DIAGNOSIS — E559 Vitamin D deficiency, unspecified: Secondary | ICD-10-CM

## 2019-11-22 ENCOUNTER — Other Ambulatory Visit (INDEPENDENT_AMBULATORY_CARE_PROVIDER_SITE_OTHER): Payer: Self-pay | Admitting: Family Medicine

## 2019-11-22 DIAGNOSIS — E1165 Type 2 diabetes mellitus with hyperglycemia: Secondary | ICD-10-CM

## 2019-11-25 ENCOUNTER — Other Ambulatory Visit: Payer: Self-pay

## 2019-11-25 ENCOUNTER — Ambulatory Visit: Payer: Self-pay | Admitting: Physical Therapy

## 2019-11-29 ENCOUNTER — Ambulatory Visit (INDEPENDENT_AMBULATORY_CARE_PROVIDER_SITE_OTHER): Payer: 59 | Admitting: Family Medicine

## 2019-12-08 ENCOUNTER — Telehealth: Payer: Self-pay | Admitting: Orthopaedic Surgery

## 2019-12-08 NOTE — Telephone Encounter (Signed)
11/09/19 ov note faxed to [facility] scheduled 12/13/19

## 2019-12-09 LAB — BASIC METABOLIC PANEL
BUN: 11 (ref 4–21)
CO2: 29 — AB (ref 13–22)
Chloride: 101 (ref 99–108)
Creatinine: 0.6 (ref 0.5–1.1)
Glucose: 115
Potassium: 3.9 (ref 3.4–5.3)
Sodium: 138 (ref 137–147)

## 2019-12-09 LAB — COMPREHENSIVE METABOLIC PANEL
Calcium: 9.3 (ref 8.7–10.7)
GFR calc Af Amer: 129
GFR calc non Af Amer: 107

## 2019-12-09 LAB — MICROALBUMIN, URINE: Microalb, Ur: <0.7

## 2019-12-09 LAB — CBC: RBC: 4.35 (ref 3.87–5.11)

## 2019-12-09 LAB — CBC AND DIFFERENTIAL
HCT: 35 — AB (ref 36–46)
Hemoglobin: 11 — AB (ref 12.0–16.0)
Platelets: 339 (ref 150–399)
WBC: 7.4

## 2019-12-09 LAB — HEMOGLOBIN A1C: Hemoglobin A1C: 7.5

## 2019-12-14 ENCOUNTER — Encounter (INDEPENDENT_AMBULATORY_CARE_PROVIDER_SITE_OTHER): Payer: Self-pay

## 2019-12-15 ENCOUNTER — Ambulatory Visit (INDEPENDENT_AMBULATORY_CARE_PROVIDER_SITE_OTHER): Payer: 59 | Admitting: Physician Assistant

## 2019-12-15 ENCOUNTER — Other Ambulatory Visit: Payer: Self-pay

## 2019-12-15 ENCOUNTER — Encounter (INDEPENDENT_AMBULATORY_CARE_PROVIDER_SITE_OTHER): Payer: Self-pay | Admitting: Physician Assistant

## 2019-12-15 VITALS — BP 143/72 | HR 70 | Temp 98.0°F | Ht 68.0 in | Wt 307.0 lb

## 2019-12-15 DIAGNOSIS — E559 Vitamin D deficiency, unspecified: Secondary | ICD-10-CM

## 2019-12-15 DIAGNOSIS — Z9189 Other specified personal risk factors, not elsewhere classified: Secondary | ICD-10-CM

## 2019-12-15 DIAGNOSIS — E1165 Type 2 diabetes mellitus with hyperglycemia: Secondary | ICD-10-CM

## 2019-12-15 DIAGNOSIS — Z6841 Body Mass Index (BMI) 40.0 and over, adult: Secondary | ICD-10-CM

## 2019-12-15 MED ORDER — RYBELSUS 7 MG PO TABS: 7.0000 mg | ORAL_TABLET | Freq: Every day | ORAL | 0 refills | Status: DC

## 2019-12-15 MED ORDER — VITAMIN D (ERGOCALCIFEROL) 1.25 MG (50000 UNIT) PO CAPS: 50000.0000 [IU] | ORAL_CAPSULE | ORAL | 0 refills | Status: DC

## 2019-12-15 NOTE — Progress Notes (Signed)
Chief Complaint:   OBESITY Erin [name] is here to discuss her progress with her obesity treatment plan along with follow-up of her obesity related diagnoses. Erin [name] is on the Category 3 Plan and states she is following her eating plan approximately 50% of the time. Erin [name] states she is exercising for 0 minutes 0 times per week.  Today's visit was #: 25 Starting weight: 330 lbs Starting date: 07/16/2018 Today's weight: 307 lbs Today's date: 12/15/2019 Total lbs lost to date: 23 lbs Total lbs lost since last in-office visit: 2 lbs  Interim History: Erin [name] did well with weight loss.  She is not drinking her milk with breakfast or eating her yogurt with lunch.  She works 4 pm to 12 am and eats out for dinner most nights.  Subjective:   1. Type 2 diabetes mellitus with hyperglycemia, without long-term current use of insulin (HCC) [name] is taking Rybelsus.  No nausea, vomiting, diarrhea, or polyphagia.  Lab Results  Component Value Date   HGBA1C 7.5 12/09/2019   HGBA1C 7.4 (H) 08/10/2019   HGBA1C 8.0 (H) 04/14/2019   Lab Results  Component Value Date   MICROALBUR <0.7 12/09/2019   LDLCALC 68 04/14/2019   CREATININE 0.6 12/09/2019   Lab Results  Component Value Date   INSULIN 22.7 08/10/2019   INSULIN 16.3 04/14/2019   INSULIN 24.2 07/16/2018   2. Vitamin D deficiency Erin [name]'s Vitamin D level was 43.5 on 08/10/2019. She is currently taking prescription vitamin D 50,000 IU each week. She denies nausea, vomiting or muscle weakness.  3. At risk for heart disease Erin [name] is at a higher than average risk for cardiovascular disease due to obesity.   Assessment/Plan:   1. Type 2 diabetes mellitus with hyperglycemia, without long-term current use of insulin (HCC) Good blood sugar control is important to decrease the likelihood of diabetic complications such as nephropathy, neuropathy, limb loss, blindness, coronary artery disease, and death. Intensive lifestyle modification including  diet, exercise and weight loss are the first line of treatment for diabetes.  - Semaglutide (RYBELSUS) 7 MG TABS; Take 7 mg by mouth daily.  Dispense: 30 tablet; Refill: 0  2. Vitamin D deficiency Low Vitamin D level contributes to fatigue and are associated with obesity, breast, and colon cancer. She agrees to continue to take prescription Vitamin D @50 ,000 IU every week and will follow-up for routine testing of Vitamin D, at least 2-3 times per year to avoid over-replacement. - Vitamin D, Ergocalciferol, (DRISDOL) 1.25 MG (50000 UNIT) CAPS capsule; Take 1 capsule (50,000 Units total) by mouth every 14 (fourteen) days.  Dispense: 2 capsule; Refill: 0  3. At risk for heart disease [name] was given approximately 15 minutes of coronary artery disease prevention counseling today. She is 59 y.o. female and has risk factors for heart disease including obesity. We discussed intensive lifestyle modifications today with an emphasis on specific weight loss instructions and strategies.   Repetitive spaced learning was employed today to elicit superior memory formation and behavioral change.  4. Class 3 severe obesity with serious comorbidity and body mass index (BMI) of 45.0 to 49.9 in adult, unspecified obesity type [facility]) Erin [name] is currently in the action stage of change. As such, her goal is to continue with weight loss efforts. She has agreed to the Category 3 Plan.   Exercise goals: For substantial health benefits, adults should do at least 150 minutes (2 hours and 30 minutes) a week of moderate-intensity, or 75 minutes (1 hour and 15 minutes) a  week of vigorous-intensity aerobic physical activity, or an equivalent combination of moderate- and vigorous-intensity aerobic activity. Aerobic activity should be performed in episodes of at least 10 minutes, and preferably, it should be spread throughout the week.  Behavioral modification strategies: meal planning and cooking strategies and planning for success.   Erin [name] has agreed to follow-up with our clinic in 2 weeks. She was informed of the importance of frequent follow-up visits to maximize her success with intensive lifestyle modifications for her multiple health conditions.   Objective:   Blood pressure (!) 143/72, pulse 70, temperature 98 F (36.7 C), temperature source Oral, height 5\' 8"  (1.727 m), weight (!) 307 lb (139.3 kg), SpO2 99 %. Body mass index is 46.68 kg/m.  General: Cooperative, alert, well developed, in no acute distress. HEENT: Conjunctivae and lids unremarkable. Cardiovascular: Regular rhythm.  Lungs: Normal work of breathing. Neurologic: No focal deficits.   Lab Results  Component Value Date   CREATININE 0.6 12/09/2019   BUN 11 12/09/2019   NA 138 12/09/2019   K 3.9 12/09/2019   CL 101 12/09/2019   CO2 29 (A) 12/09/2019   Lab Results  Component Value Date   ALT 11 08/10/2019   AST 11 08/10/2019   ALKPHOS 91 08/10/2019   BILITOT <0.2 08/10/2019   Lab Results  Component Value Date   HGBA1C 7.5 12/09/2019   HGBA1C 7.4 (H) 08/10/2019   HGBA1C 8.0 (H) 04/14/2019   HGBA1C 8.6 (H) 07/16/2018   Lab Results  Component Value Date   INSULIN 22.7 08/10/2019   INSULIN 16.3 04/14/2019   INSULIN 24.2 07/16/2018   Lab Results  Component Value Date   TSH 3.690 07/16/2018   Lab Results  Component Value Date   CHOL 134 04/14/2019   HDL 47 04/14/2019   LDLCALC 68 04/14/2019   TRIG 95 04/14/2019   Lab Results  Component Value Date   WBC 7.4 12/09/2019   HGB 11.0 (A) 12/09/2019   HCT 35 (A) 12/09/2019   MCV 79 07/16/2018   PLT 339 12/09/2019   Lab Results  Component Value Date   IRON 38 (L) 12/20/2016   TIBC 244 12/20/2016   FERRITIN 590 (H) 12/20/2016   Attestation Statements:   Reviewed by clinician on day of visit: allergies, medications, problem list, medical history, surgical history, family history, social history, and previous encounter notes.  I, Water quality scientist, CMA, am acting as  Location manager for [employer], PA-C.  I have reviewed the above documentation for accuracy and completeness, and I agree with the above. Abby Potash, PA-C

## 2020-01-04 ENCOUNTER — Ambulatory Visit (INDEPENDENT_AMBULATORY_CARE_PROVIDER_SITE_OTHER): Payer: 59 | Admitting: Physician Assistant

## 2020-01-07 ENCOUNTER — Other Ambulatory Visit (INDEPENDENT_AMBULATORY_CARE_PROVIDER_SITE_OTHER): Payer: Self-pay | Admitting: Physician Assistant

## 2020-01-07 DIAGNOSIS — E559 Vitamin D deficiency, unspecified: Secondary | ICD-10-CM

## 2020-01-07 DIAGNOSIS — E1165 Type 2 diabetes mellitus with hyperglycemia: Secondary | ICD-10-CM

## 2020-01-08 ENCOUNTER — Other Ambulatory Visit: Payer: Self-pay | Admitting: Cardiovascular Disease

## 2020-01-12 ENCOUNTER — Ambulatory Visit (INDEPENDENT_AMBULATORY_CARE_PROVIDER_SITE_OTHER): Payer: Worker's Compensation | Admitting: Orthopaedic Surgery

## 2020-01-12 ENCOUNTER — Other Ambulatory Visit: Payer: Self-pay

## 2020-01-12 ENCOUNTER — Encounter: Payer: Self-pay | Admitting: Orthopaedic Surgery

## 2020-01-12 VITALS — Ht 69.0 in | Wt 305.0 lb

## 2020-01-12 DIAGNOSIS — M545 Low back pain, unspecified: Secondary | ICD-10-CM

## 2020-01-12 DIAGNOSIS — M5126 Other intervertebral disc displacement, lumbar region: Secondary | ICD-10-CM | POA: Diagnosis not present

## 2020-01-12 NOTE — Progress Notes (Signed)
Office Visit Note   Patient: Erin [name]           Date of Birth: [birth year]           MRN: [id number] Visit Date: 01/12/2020              Requested by: No referring provider defined for this encounter. PCP: Erin Austin, MD (Inactive)   Assessment & Plan: Visit Diagnoses:  1. Low back pain, unspecified back pain laterality, unspecified chronicity, unspecified whether sciatica present   2. Protrusion of lumbar intervertebral disc   3. Morbid (severe) obesity due to excess calories ([place])     Plan: We will continue physical therapy recheck her in 1 month.  We discussed continued improvement in her back symptoms with continued weight loss.  She likely would get some relief with an epidural steroid injection however I am concerned that this might cause her to gain weight and since she is gradually been losing and getting better I recommend deferring consideration of epidural.  Recheck 1 month.  Follow-Up Instructions: Return in about 1 month (around 02/12/2020).   Orders:  Orders Placed This Encounter  Procedures  . Ambulatory referral to Physical Therapy   No orders of the defined types were placed in this encounter.     Procedures: No procedures performed   Clinical Data: No additional findings.   Subjective: Chief Complaint  Patient presents with  . Lower Back - Pain, Follow-up    HPI 59 year old female returns she has been progressing with physical therapy and pending approval of few more visits.  Patient is using Advil with relief.  She has lost 25 pounds and is continuing to diet.  She has had increased discomfort recently in her left leg radiates down her foot with some difficulty sleeping at night.  Previous MRI showed extraforaminal disc protrusion at L2-3 on the left which corresponds with her pain mostly radiates down the left anterior thigh stops at the knee.  Patient is requesting a few more physical therapy visits. 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 Review of Systems updated unchanged from 03/10/2019 office visit other than as mentioned in HPI.   Objective: Vital Signs: Ht 5\' 9"  (1.753 m)   Wt (!) 305 lb (138.3 kg)   BMI 45.04 kg/m   Physical Exam Constitutional:      Appearance: She is well-developed.  HENT:     Head: Normocephalic.     Right Ear: External ear normal.     Left Ear: External ear normal.  Eyes:     Pupils: Pupils are equal, round, and reactive to light.  Neck:     Thyroid: No thyromegaly.     Trachea: No tracheal deviation.  Cardiovascular:     Rate and Rhythm: Normal rate.  Pulmonary:     Effort: Pulmonary effort is normal.  Abdominal:     Palpations: Abdomen is soft.  Skin:    General: Skin is warm and dry.  Neurological:     Mental Status: She is alert and oriented to person, place, and time.  Psychiatric:        Behavior: Behavior normal.     Ortho Exam patient has good hip range of motion.  Negative straight leg raising 90 degrees.  Quad is strong with manual testing she is ambulatory without limping.  Negative logroll to the hips negative popliteal compression test anterior tib gastrocsoleus is strong no hip flexion weakness. Specialty Comments:  No specialty comments available.  Imaging: No results found.   PMFS History: Patient Active Problem List   Diagnosis Date Noted  . Protrusion of lumbar intervertebral disc 01/12/2020  . Low back pain 05/11/2019  . Morbid (severe) obesity due to excess calories ([place]) 11/25/2016  . Shortness of breath 06/13/2016  . Atypical chest pain 06/13/2016  . Anemia of chronic disease 03/08/2015  . High blood pressure 07/22/2012  . Diabetes mellitus ([place]) 07/22/2012   Past Medical History:  Diagnosis Date  . Anemia   . Atypical chest pain 06/13/2016  . Back pain   . Breast nodule 2009   Right   . Diabetes mellitus   . Fibroid    Symptomatic  . H/O dysmenorrhea   . H/O hematuria   . H/O hypercholesterolemia   . H/O:  menorrhagia   . H/O: obesity   . HPV in female   . Hypertension   . Microhematuria 2012  . Shortness of breath 06/13/2016  . Yeast vaginitis 2008    Family History  Problem Relation Age of Onset  . Diabetes Mother   . Valvular heart disease Mother   . Diabetes Brother   . Hypertension Father   . Stroke Father     Past Surgical History:  Procedure Laterality Date  . ABDOMINAL HYSTERECTOMY    . HYSTEROSCOPY WITH D & C  2003  . POLYPECTOMY  2008  . TUBAL LIGATION  1984   Bilateral   Social History   Occupational History  . Occupation: Glass blower/designer  Tobacco Use  . Smoking status: Never Smoker  . Smokeless tobacco: Never Used  Substance and Sexual Activity  . Alcohol use: No  . Drug use: No  . Sexual activity: Never    Comment: hysterectomy

## 2020-01-13 ENCOUNTER — Ambulatory Visit (INDEPENDENT_AMBULATORY_CARE_PROVIDER_SITE_OTHER): Payer: 59 | Admitting: Physician Assistant

## 2020-01-13 ENCOUNTER — Encounter (INDEPENDENT_AMBULATORY_CARE_PROVIDER_SITE_OTHER): Payer: Self-pay | Admitting: Physician Assistant

## 2020-01-13 VITALS — BP 126/76 | HR 65 | Temp 97.8°F | Ht 68.0 in | Wt 312.0 lb

## 2020-01-13 DIAGNOSIS — Z6841 Body Mass Index (BMI) 40.0 and over, adult: Secondary | ICD-10-CM

## 2020-01-13 DIAGNOSIS — E1165 Type 2 diabetes mellitus with hyperglycemia: Secondary | ICD-10-CM

## 2020-01-13 DIAGNOSIS — E7849 Other hyperlipidemia: Secondary | ICD-10-CM

## 2020-01-13 DIAGNOSIS — Z9189 Other specified personal risk factors, not elsewhere classified: Secondary | ICD-10-CM

## 2020-01-13 MED ORDER — RYBELSUS 7 MG PO TABS: 7.0000 mg | ORAL_TABLET | Freq: Every day | ORAL | 0 refills | Status: DC

## 2020-01-13 NOTE — Progress Notes (Signed)
Chief Complaint:   OBESITY Erin [name] is here to discuss her progress with her obesity treatment plan along with follow-up of her obesity related diagnoses. Erin [name] is on the Category 3 Plan and states she is following her eating plan approximately 50% of the time. Lorilyn states she is doing 0 minutes 0 times per week.  Today's visit was #: 26 Starting weight: 330 lbs Starting date: 07/16/2018 Today's weight: 312 lbs Today's date: 01/13/2020 Total lbs lost to date: 18 Total lbs lost since last in-office visit: 0  Interim History: Erin [name] has been having some back and  left sided sciatic pain, and has been doing physical therapy. She states that she is not eating enough, and there are some days that she does not feel like eating at all.  Subjective:   1. Type 2 diabetes mellitus with hyperglycemia, without long-term current use of insulin (HCC) [name] is on Rybelsus and metformin. She denies nausea, vomiting, diarrhea, or polyphagia.  2. Other hyperlipidemia Erin [name] is on rosuvastatin, and she denies chest pain or myalgias.  3. At risk for hypoglycemia Erin [name] is at increased risk for hypoglycemia due to changes in diet, diagnosis of diabetes, and/or insulin use. [name] is not currently taking insulin.   Assessment/Plan:   1. Type 2 diabetes mellitus with hyperglycemia, without long-term current use of insulin (HCC) Good blood sugar control is important to decrease the likelihood of diabetic complications such as nephropathy, neuropathy, limb loss, blindness, coronary artery disease, and death. Intensive lifestyle modification including diet, exercise and weight loss are the first line of treatment for diabetes. We will refill Rybelsus for 1 month.   - Semaglutide (RYBELSUS) 7 MG TABS; Take 7 mg by mouth daily.  Dispense: 30 tablet; Refill: 0  2. Other hyperlipidemia Cardiovascular risk and specific lipid/LDL goals reviewed. We discussed several lifestyle modifications today. [name]  will continue her medications, and will continue to work on diet, exercise and weight loss efforts. Orders and follow up as documented in patient record.   Counseling Intensive lifestyle modifications are the first line treatment for this issue. . Dietary changes: Increase soluble fiber. Decrease simple carbohydrates. . Exercise changes: Moderate to vigorous-intensity aerobic activity 150 minutes per week if tolerated. . Lipid-lowering medications: see documented in medical record.  3. At risk for hypoglycemia Erin [name] was given approximately 15 minutes of counseling today regarding prevention of hypoglycemia. She was advised of symptoms of hypoglycemia. Erin [name] was instructed to avoid skipping meals, eat regular protein rich meals and schedule low calorie snacks as needed.   Repetitive spaced learning was employed today to elicit superior memory formation and behavioral change  4. Class 3 severe obesity with serious comorbidity and body mass index (BMI) of 45.0 to 49.9 in adult, unspecified obesity type [facility]) Erin [name] is currently in the action stage of change. As such, her goal is to continue with weight loss efforts. She has agreed to keeping a food journal and adhering to recommended goals of 1400-1600 calories and 95 grams of protein daily.   Exercise goals: No exercise has been prescribed at this time.  Behavioral modification strategies: meal planning and cooking strategies and keeping healthy foods in the home.  Erin [name] has agreed to follow-up with our clinic in 2 to 3 weeks. She was informed of the importance of frequent follow-up visits to maximize her success with intensive lifestyle modifications for her multiple health conditions.   Objective:   Blood pressure 126/76, pulse 65, temperature 97.8 F (36.6 C), temperature source Oral, height  5\' 8"  (1.727 m), weight (!) 312 lb (141.5 kg), SpO2 99 %. Body mass index is 47.44 kg/m.  General: Cooperative, alert, well developed, in no  acute distress. HEENT: Conjunctivae and lids unremarkable. Cardiovascular: Regular rhythm.  Lungs: Normal work of breathing. Neurologic: No focal deficits.   Lab Results  Component Value Date   CREATININE 0.6 12/09/2019   BUN 11 12/09/2019   NA 138 12/09/2019   K 3.9 12/09/2019   CL 101 12/09/2019   CO2 29 (A) 12/09/2019   Lab Results  Component Value Date   ALT 11 08/10/2019   AST 11 08/10/2019   ALKPHOS 91 08/10/2019   BILITOT <0.2 08/10/2019   Lab Results  Component Value Date   HGBA1C 7.5 12/09/2019   HGBA1C 7.4 (H) 08/10/2019   HGBA1C 8.0 (H) 04/14/2019   HGBA1C 8.6 (H) 07/16/2018   Lab Results  Component Value Date   INSULIN 22.7 08/10/2019   INSULIN 16.3 04/14/2019   INSULIN 24.2 07/16/2018   Lab Results  Component Value Date   TSH 3.690 07/16/2018   Lab Results  Component Value Date   CHOL 134 04/14/2019   HDL 47 04/14/2019   LDLCALC 68 04/14/2019   TRIG 95 04/14/2019   Lab Results  Component Value Date   WBC 7.4 12/09/2019   HGB 11.0 (A) 12/09/2019   HCT 35 (A) 12/09/2019   MCV 79 07/16/2018   PLT 339 12/09/2019   Lab Results  Component Value Date   IRON 38 (L) 12/20/2016   TIBC 244 12/20/2016   FERRITIN 590 (H) 12/20/2016   Attestation Statements:   Reviewed by clinician on day of visit: allergies, medications, problem list, medical history, surgical history, family history, social history, and previous encounter notes.   Wilhemena Durie, am acting as transcriptionist for [employer], PA-C.  I have reviewed the above documentation for accuracy and completeness, and I agree with the above. Abby Potash, PA-C

## 2020-01-25 ENCOUNTER — Other Ambulatory Visit: Payer: Self-pay | Admitting: Cardiovascular Disease

## 2020-01-25 ENCOUNTER — Other Ambulatory Visit (INDEPENDENT_AMBULATORY_CARE_PROVIDER_SITE_OTHER): Payer: Self-pay | Admitting: Family Medicine

## 2020-01-25 DIAGNOSIS — E559 Vitamin D deficiency, unspecified: Secondary | ICD-10-CM

## 2020-02-03 ENCOUNTER — Ambulatory Visit (INDEPENDENT_AMBULATORY_CARE_PROVIDER_SITE_OTHER): Payer: 59 | Admitting: Physician Assistant

## 2020-02-09 ENCOUNTER — Encounter: Payer: Self-pay | Admitting: Orthopaedic Surgery

## 2020-02-09 ENCOUNTER — Ambulatory Visit (INDEPENDENT_AMBULATORY_CARE_PROVIDER_SITE_OTHER): Payer: Worker's Compensation | Admitting: Orthopaedic Surgery

## 2020-02-09 VITALS — BP 119/71 | HR 61

## 2020-02-09 DIAGNOSIS — M5126 Other intervertebral disc displacement, lumbar region: Secondary | ICD-10-CM | POA: Diagnosis not present

## 2020-02-09 NOTE — Progress Notes (Signed)
Office Visit Note   Patient: Erin [name]           Date of Birth: 30-Jan-1961           MRN: [id number] Visit Date: 02/09/2020              Requested by: No referring provider defined for this encounter. PCP: Darcus Austin, MD (Inactive)   Assessment & Plan: Visit Diagnoses:  1. Protrusion of lumbar intervertebral disc   2. Morbid (severe) obesity due to excess calories [facility])     Plan: Patient is making gradual progress with losing some weight which has been difficult for her.  We discussed continued improvement in her back symptoms with weight loss would be expected.  I plan to recheck her in 4 months.  She can finish out her therapy continue using the Tylenol primarily occasionally the ibuprofen.  Follow-Up Instructions: Return in about 4 months (around 06/10/2020).   Orders:  No orders of the defined types were placed in this encounter.  No orders of the defined types were placed in this encounter.     Procedures: No procedures performed   Clinical Data: No additional findings.   Subjective: Chief Complaint  Patient presents with  . Lower Back - Pain, Follow-up    HPI 59 year old female returns for ongoing problems with back pain.  She has had to [employer] associated injuries.  She has been going to therapy and has 5 more visits left.  She states she is doing about the same has some back pain some muscle spasms and is using Advil as needed.  We discussed she could also take Tylenol and discussed maximum dosage for both medications.  She denies associated bowel or bladder symptoms no fever or chills.  She does have type 2 diabetes.  BMI is 45 and she is continuing to work on this and has lost weight.  Previous MRI showed some extraforaminal disc protrusion at L2-3 on the left which most likely corresponds with her symptoms which is anterior thigh pain that sometimes radiates just past her knee and stops but not down to her foot.  Review of Systems 14 point  systems unchanged from 03/10/2019 office visit.   Objective: Vital Signs: BP 119/71   Pulse 61   Physical Exam Constitutional:      Appearance: She is well-developed.  HENT:     Head: Normocephalic.     Right Ear: External ear normal.     Left Ear: External ear normal.  Eyes:     Pupils: Pupils are equal, round, and reactive to light.  Neck:     Thyroid: No thyromegaly.     Trachea: No tracheal deviation.  Cardiovascular:     Rate and Rhythm: Normal rate.  Pulmonary:     Effort: Pulmonary effort is normal.  Abdominal:     Palpations: Abdomen is soft.  Skin:    General: Skin is warm and dry.  Neurological:     Mental Status: She is alert and oriented to person, place, and time.  Psychiatric:        Behavior: Behavior normal.     Ortho Exam negative logroll to the hips quads are strong negative straight leg raising 90 degrees.  Specialty Comments:  No specialty comments available.  Imaging: No results found.   PMFS History: Patient Active Problem List   Diagnosis Date Noted  . Protrusion of lumbar intervertebral disc 01/12/2020  . Low back pain 05/11/2019  . Morbid (severe) obesity due  to excess calories ([employer]) 11/25/2016  . Shortness of breath 06/13/2016  . Atypical chest pain 06/13/2016  . Anemia of chronic disease 03/08/2015  . High blood pressure 07/22/2012  . Diabetes mellitus ([place]) 07/22/2012   Past Medical History:  Diagnosis Date  . Anemia   . Atypical chest pain 06/13/2016  . Back pain   . Breast nodule 2009   Right   . Diabetes mellitus   . Fibroid    Symptomatic  . H/O dysmenorrhea   . H/O hematuria   . H/O hypercholesterolemia   . H/O: menorrhagia   . H/O: obesity   . HPV in female   . Hypertension   . Microhematuria 2012  . Shortness of breath 06/13/2016  . Yeast vaginitis 2008    Family History  Problem Relation Age of Onset  . Diabetes Mother   . Valvular heart disease Mother   . Diabetes Brother   . Hypertension Father   .  Stroke Father     Past Surgical History:  Procedure Laterality Date  . ABDOMINAL HYSTERECTOMY    . HYSTEROSCOPY WITH D & C  2003  . POLYPECTOMY  2008  . TUBAL LIGATION  1984   Bilateral   Social History   Occupational History  . Occupation: Glass blower/designer  Tobacco Use  . Smoking status: Never Smoker  . Smokeless tobacco: Never Used  Substance and Sexual Activity  . Alcohol use: No  . Drug use: No  . Sexual activity: Never    Comment: hysterectomy

## 2020-02-15 ENCOUNTER — Other Ambulatory Visit (INDEPENDENT_AMBULATORY_CARE_PROVIDER_SITE_OTHER): Payer: Self-pay | Admitting: Physician Assistant

## 2020-02-15 DIAGNOSIS — E1165 Type 2 diabetes mellitus with hyperglycemia: Secondary | ICD-10-CM

## 2020-02-17 ENCOUNTER — Other Ambulatory Visit: Payer: Self-pay | Admitting: Cardiovascular Disease

## 2020-02-20 ENCOUNTER — Other Ambulatory Visit: Payer: Self-pay | Admitting: Cardiovascular Disease

## 2020-02-23 ENCOUNTER — Ambulatory Visit (INDEPENDENT_AMBULATORY_CARE_PROVIDER_SITE_OTHER): Payer: 59 | Admitting: Family Medicine

## 2020-02-23 ENCOUNTER — Encounter (INDEPENDENT_AMBULATORY_CARE_PROVIDER_SITE_OTHER): Payer: Self-pay | Admitting: Family Medicine

## 2020-02-23 ENCOUNTER — Other Ambulatory Visit: Payer: Self-pay

## 2020-02-23 VITALS — BP 107/68 | HR 69 | Temp 98.0°F | Ht 68.0 in | Wt 309.0 lb

## 2020-02-23 DIAGNOSIS — E860 Dehydration: Secondary | ICD-10-CM | POA: Diagnosis not present

## 2020-02-23 DIAGNOSIS — R7303 Prediabetes: Secondary | ICD-10-CM | POA: Diagnosis not present

## 2020-02-23 DIAGNOSIS — Z6841 Body Mass Index (BMI) 40.0 and over, adult: Secondary | ICD-10-CM

## 2020-02-23 DIAGNOSIS — E559 Vitamin D deficiency, unspecified: Secondary | ICD-10-CM

## 2020-02-23 DIAGNOSIS — Z9189 Other specified personal risk factors, not elsewhere classified: Secondary | ICD-10-CM | POA: Diagnosis not present

## 2020-02-23 MED ORDER — VITAMIN D (ERGOCALCIFEROL) 1.25 MG (50000 UNIT) PO CAPS: 50000.0000 [IU] | ORAL_CAPSULE | ORAL | 0 refills | Status: DC

## 2020-02-23 MED ORDER — RYBELSUS 7 MG PO TABS: 7.0000 mg | ORAL_TABLET | Freq: Every day | ORAL | 0 refills | Status: DC

## 2020-02-28 NOTE — Progress Notes (Signed)
Chief Complaint:   OBESITY Erin [name] is here to discuss her progress with her obesity treatment plan along with follow-up of her obesity related diagnoses. Erin [name] is on keeping a food journal and adhering to recommended goals of 1400-1600 calories and 95 grams of protein daily and states she is following her eating plan approximately 50% of the time. Erin [name] states she is doing 0 minutes 0 times per week.  Today's visit was #: 27 Starting weight: 330 lbs Starting date: 07/16/2018 Today's weight: 309 lbs Today's date: 02/23/2020 Total lbs lost to date: 21 Total lbs lost since last in-office visit: 3  Interim History: Erin [name] has chronic back pain with radiculopathy. She is doing physical therapy but no exercise besides that. Her hunger is well controlled, and she actually has little hunger and often forgets to eat. She is on vacations the week of July 4th.  Subjective:   1. Pre-diabetes [name]'s fasting insulin was elevated on her last labs.  2. Vitamin D deficiency Erin [name]'s last vit D level was 73 in April 2021. She denies side effects of her medication, and is tolerating it well.  3. Mild dehydration Erin [name]'s Na is elevated and elevated BUN on her last labs with elevated creatinine. Her blood pressure is greatly controlled today. She denies concerns.  4. At risk for hypoglycemia Erin [name] is at increased risk for hypoglycemia due to changes in diet, diagnosis of diabetes, and/or insulin use.   Assessment/Plan:   1. Pre-diabetes Erin [name] will continue her diet plan and work on weight loss, and decreasing simple carbohydrates to help decrease the risk of diabetes. We will refill Rybelsus for 1 month.  - Semaglutide (RYBELSUS) 7 MG TABS; Take 7 mg by mouth daily.  Dispense: 30 tablet; Refill: 0  2. Vitamin D deficiency Low Vitamin D level contributes to fatigue and are associated with obesity, breast, and colon cancer. Erin [name] agreed to continue taking prescription Vitamin D 50,000 IU  every 14 days, and we will refill for 1 month. She will follow-up for routine testing of Vitamin D, at least 2-3 times per year to avoid over-replacement. We will recheck labs at her next office visit.  - Vitamin D, Ergocalciferol, (DRISDOL) 1.25 MG (50000 UNIT) CAPS capsule; Take 1 capsule (50,000 Units total) by mouth every 14 (fourteen) days.  Dispense: 2 capsule; Refill: 0  3. Mild dehydration I advised [name] to push water, at least 60-80 oz per day.  4. At risk for hypoglycemia Erin [name] was given approximately 15 minutes of counseling today regarding prevention of hypoglycemia. She was advised of symptoms of hypoglycemia. Erin [name] was instructed to avoid skipping meals, eat regular protein rich meals and schedule low calorie snacks as needed.   Repetitive spaced learning was employed today to elicit superior memory formation and behavioral change  5. Class 3 severe obesity with serious comorbidity and body mass index (BMI) of 45.0 to 49.9 in adult, unspecified obesity type [facility]) Erin [name] is currently in the action stage of change. As such, her goal is to continue with weight loss efforts. She has agreed to the Category 3 Plan.   Exercise goals: Erin [name] is to ride the bike for 10 minutes. She is to use strengthening bands per physical therapy recommendations.  Behavioral modification strategies: no skipping meals and travel eating strategies.  Erin [name] has agreed to follow-up with our clinic in 2 weeks. She was informed of the importance of frequent follow-up visits to maximize her success with intensive lifestyle modifications for her multiple health conditions.  Objective:   Blood pressure 107/68, pulse 69, temperature 98 F (36.7 C), temperature source Oral, height 5\' 8"  (1.727 m), weight (!) 309 lb (140.2 kg), SpO2 99 %. Body mass index is 46.98 kg/m.  General: Cooperative, alert, well developed, in no acute distress. HEENT: Conjunctivae and lids unremarkable. Cardiovascular: Regular  rhythm.  Lungs: Normal work of breathing. Neurologic: No focal deficits.   Lab Results  Component Value Date   CREATININE 0.6 12/09/2019   BUN 11 12/09/2019   NA 138 12/09/2019   K 3.9 12/09/2019   CL 101 12/09/2019   CO2 29 (A) 12/09/2019   Lab Results  Component Value Date   ALT 11 08/10/2019   AST 11 08/10/2019   ALKPHOS 91 08/10/2019   BILITOT <0.2 08/10/2019   Lab Results  Component Value Date   HGBA1C 7.5 12/09/2019   HGBA1C 7.4 (H) 08/10/2019   HGBA1C 8.0 (H) 04/14/2019   HGBA1C 8.6 (H) 07/16/2018   Lab Results  Component Value Date   INSULIN 22.7 08/10/2019   INSULIN 16.3 04/14/2019   INSULIN 24.2 07/16/2018   Lab Results  Component Value Date   TSH 3.690 07/16/2018   Lab Results  Component Value Date   CHOL 134 04/14/2019   HDL 47 04/14/2019   LDLCALC 68 04/14/2019   TRIG 95 04/14/2019   Lab Results  Component Value Date   WBC 7.4 12/09/2019   HGB 11.0 (A) 12/09/2019   HCT 35 (A) 12/09/2019   MCV 79 07/16/2018   PLT 339 12/09/2019   Lab Results  Component Value Date   IRON 38 (L) 12/20/2016   TIBC 244 12/20/2016   FERRITIN 590 (H) 12/20/2016   Attestation Statements:   Reviewed by clinician on day of visit: allergies, medications, problem list, medical history, surgical history, family history, social history, and previous encounter notes.   I, Hendy Brindle, am acting as transcriptionist for Dennard Nip, MD.  I have reviewed the above documentation for accuracy and completeness, and I agree with the above. -  Dennard Nip, MD

## 2020-03-02 ENCOUNTER — Other Ambulatory Visit: Payer: Self-pay | Admitting: Cardiovascular Disease

## 2020-03-06 ENCOUNTER — Other Ambulatory Visit: Payer: Self-pay | Admitting: Cardiovascular Disease

## 2020-03-06 MED ORDER — ROSUVASTATIN CALCIUM 10 MG PO TABS: 10.0000 mg | ORAL_TABLET | Freq: Every day | ORAL | 0 refills | Status: DC

## 2020-03-06 NOTE — Telephone Encounter (Signed)
°*  STAT* If patient is at the pharmacy, call can be transferred to refill team.   1. Which medications need to be refilled? (please list name of each medication and dose if known)  rosuvastatin (CRESTOR) 10 MG tablet  2. Which pharmacy/location (including street and city if local pharmacy) is medication to be sent to? [id number] - [place], [place] - [address]  3. Do they need a 30 day or 90 day supply? 90 day supply   Patient has an appointment scheduled for 05/03/20 at 9:20 AM.

## 2020-03-14 ENCOUNTER — Other Ambulatory Visit (INDEPENDENT_AMBULATORY_CARE_PROVIDER_SITE_OTHER): Payer: Self-pay | Admitting: Family Medicine

## 2020-03-14 DIAGNOSIS — E559 Vitamin D deficiency, unspecified: Secondary | ICD-10-CM

## 2020-03-16 ENCOUNTER — Ambulatory Visit (INDEPENDENT_AMBULATORY_CARE_PROVIDER_SITE_OTHER): Payer: 59 | Admitting: Physician Assistant

## 2020-03-16 ENCOUNTER — Other Ambulatory Visit: Payer: Self-pay

## 2020-03-16 ENCOUNTER — Encounter (INDEPENDENT_AMBULATORY_CARE_PROVIDER_SITE_OTHER): Payer: Self-pay | Admitting: Physician Assistant

## 2020-03-16 VITALS — BP 109/75 | HR 72 | Temp 98.0°F | Ht 68.0 in | Wt 308.0 lb

## 2020-03-16 DIAGNOSIS — Z9189 Other specified personal risk factors, not elsewhere classified: Secondary | ICD-10-CM | POA: Diagnosis not present

## 2020-03-16 DIAGNOSIS — E785 Hyperlipidemia, unspecified: Secondary | ICD-10-CM

## 2020-03-16 DIAGNOSIS — Z6841 Body Mass Index (BMI) 40.0 and over, adult: Secondary | ICD-10-CM

## 2020-03-16 DIAGNOSIS — E66813 Obesity, class 3: Secondary | ICD-10-CM

## 2020-03-16 DIAGNOSIS — E1169 Type 2 diabetes mellitus with other specified complication: Secondary | ICD-10-CM | POA: Diagnosis not present

## 2020-03-16 DIAGNOSIS — E7849 Other hyperlipidemia: Secondary | ICD-10-CM

## 2020-03-16 MED ORDER — RYBELSUS 7 MG PO TABS: 7.0000 mg | ORAL_TABLET | Freq: Every day | ORAL | 0 refills | Status: DC

## 2020-03-16 NOTE — Progress Notes (Signed)
Chief Complaint:   Erin [name] is here to discuss her progress with her obesity treatment plan along with follow-up of her obesity related diagnoses. Erin [name] is on the Category 3 Plan and states she is following her eating plan approximately 70% of the time. Erin [name] states she is exercising 0 minutes 0 times per week.  Today's visit was #: 28 Starting weight: 330 lbs Starting date: 07/16/2018 Today's weight: 308 lbs Today's date: 03/16/2020 Total lbs lost to date: 22 Total lbs lost since last in-office visit: 1  Interim History: Erin [name] has been off of work and has been eating at home more often. She is going to crock pot some meals this week.  Subjective:   Hyperlipidemia associated with type 2 diabetes mellitus ([place]). No nausea, vomiting, or diarrhea on Rybelsus.  Lab Results  Component Value Date   HGBA1C 7.5 12/09/2019   HGBA1C 7.4 (H) 08/10/2019   HGBA1C 8.0 (H) 04/14/2019   Lab Results  Component Value Date   MICROALBUR <0.7 12/09/2019   LDLCALC 68 04/14/2019   CREATININE 0.6 12/09/2019   Lab Results  Component Value Date   INSULIN 22.7 08/10/2019   INSULIN 16.3 04/14/2019   INSULIN 24.2 07/16/2018   Other hyperlipidemia. Erin [name] is on Crestor. No chest pain or myalgias.    Lab Results  Component Value Date   CHOL 134 04/14/2019   HDL 47 04/14/2019   LDLCALC 68 04/14/2019   TRIG 95 04/14/2019   Lab Results  Component Value Date   ALT 11 08/10/2019   AST 11 08/10/2019   ALKPHOS 91 08/10/2019   BILITOT <0.2 08/10/2019   The 10-year ASCVD risk score Erin Bussing DC Jr., et al., 2013) is: 6.7%   Values used to calculate the score:     Age: 59 years     Sex: Female     Is Non-Hispanic African American: Yes     Diabetic: Yes     Tobacco smoker: No     Systolic Blood Pressure: 315 mmHg     Is BP treated: Yes     HDL Cholesterol: 47 mg/dL     Total Cholesterol: 134 mg/dL  At risk for heart disease. Erin [name] is at a higher than average risk for  cardiovascular disease due to obesity.   Assessment/Plan:   Hyperlipidemia associated with type 2 diabetes mellitus ([place]). Good blood sugar control is important to decrease the likelihood of diabetic complications such as nephropathy, neuropathy, limb loss, blindness, coronary artery disease, and death. Intensive lifestyle modification including diet, exercise and weight loss are the first line of treatment for diabetes. Refill was given for Semaglutide (RYBELSUS) 7 MG TABS #30 with 0 refills.  Other hyperlipidemia. Cardiovascular risk and specific lipid/LDL goals reviewed.  We discussed several lifestyle modifications today and Erin [name] will continue to work on diet, exercise and weight loss efforts. Orders and follow up as documented in patient record. She will continue her medication as directed.   Counseling Intensive lifestyle modifications are the first line treatment for this issue. . Dietary changes: Increase soluble fiber. Decrease simple carbohydrates. . Exercise changes: Moderate to vigorous-intensity aerobic activity 150 minutes per week if tolerated. . Lipid-lowering medications: see documented in medical record.  At risk for heart disease. Erin [name] was given approximately 15 minutes of coronary artery disease prevention counseling today. She is 59 y.o. female and has risk factors for heart disease including obesity. We discussed intensive lifestyle modifications today with an emphasis on specific weight loss instructions and  strategies.   Repetitive spaced learning was employed today to elicit superior memory formation and behavioral change.  Class 3 severe obesity with serious comorbidity and body mass index (BMI) of 45.0 to 49.9 in adult, unspecified obesity type (Erin [name]).  Erin [name] is currently in the action stage of change. As such, her goal is to continue with weight loss efforts. She has agreed to the Category 3 Plan.   Exercise goals: For substantial health benefits, adults should  do at least 150 minutes (2 hours and 30 minutes) a week of moderate-intensity, or 75 minutes (1 hour and 15 minutes) a week of vigorous-intensity aerobic physical activity, or an equivalent combination of moderate- and vigorous-intensity aerobic activity. Aerobic activity should be performed in episodes of at least 10 minutes, and preferably, it should be spread throughout the week.  Behavioral modification strategies: meal planning and cooking strategies and keeping healthy foods in the home.  Erin [name] has agreed to follow-up with our clinic in 3 weeks. She was informed of the importance of frequent follow-up visits to maximize her success with intensive lifestyle modifications for her multiple health conditions.   Objective:   Blood pressure 109/75, pulse 72, temperature 98 F (36.7 C), temperature source Oral, height 5\' 8"  (1.727 m), weight (!) 308 lb (139.7 kg), SpO2 100 %. Body mass index is 46.83 kg/m.  General: Cooperative, alert, well developed, in no acute distress. HEENT: Conjunctivae and lids unremarkable. Cardiovascular: Regular rhythm.  Lungs: Normal work of breathing. Neurologic: No focal deficits.   Lab Results  Component Value Date   CREATININE 0.6 12/09/2019   BUN 11 12/09/2019   NA 138 12/09/2019   K 3.9 12/09/2019   CL 101 12/09/2019   CO2 29 (A) 12/09/2019   Lab Results  Component Value Date   ALT 11 08/10/2019   AST 11 08/10/2019   ALKPHOS 91 08/10/2019   BILITOT <0.2 08/10/2019   Lab Results  Component Value Date   HGBA1C 7.5 12/09/2019   HGBA1C 7.4 (H) 08/10/2019   HGBA1C 8.0 (H) 04/14/2019   HGBA1C 8.6 (H) 07/16/2018   Lab Results  Component Value Date   INSULIN 22.7 08/10/2019   INSULIN 16.3 04/14/2019   INSULIN 24.2 07/16/2018   Lab Results  Component Value Date   TSH 3.690 07/16/2018   Lab Results  Component Value Date   CHOL 134 04/14/2019   HDL 47 04/14/2019   LDLCALC 68 04/14/2019   TRIG 95 04/14/2019   Lab Results  Component  Value Date   WBC 7.4 12/09/2019   HGB 11.0 (A) 12/09/2019   HCT 35 (A) 12/09/2019   MCV 79 07/16/2018   PLT 339 12/09/2019   Lab Results  Component Value Date   IRON 38 (L) 12/20/2016   TIBC 244 12/20/2016   FERRITIN 590 (H) 12/20/2016   Attestation Statements:   Reviewed by clinician on day of visit: allergies, medications, problem list, medical history, surgical history, family history, social history, and previous encounter notes.  IMichaelene Song, am acting as transcriptionist for Abby Potash, PA-C   I have reviewed the above documentation for accuracy and completeness, and I agree with the above. Abby Potash, PA-C

## 2020-04-06 ENCOUNTER — Encounter (INDEPENDENT_AMBULATORY_CARE_PROVIDER_SITE_OTHER): Payer: Self-pay | Admitting: Physician Assistant

## 2020-04-06 ENCOUNTER — Other Ambulatory Visit: Payer: Self-pay

## 2020-04-06 ENCOUNTER — Ambulatory Visit (INDEPENDENT_AMBULATORY_CARE_PROVIDER_SITE_OTHER): Payer: 59 | Admitting: Physician Assistant

## 2020-04-06 VITALS — BP 130/77 | HR 64 | Temp 97.6°F | Ht 68.0 in | Wt 310.0 lb

## 2020-04-06 DIAGNOSIS — Z6841 Body Mass Index (BMI) 40.0 and over, adult: Secondary | ICD-10-CM

## 2020-04-06 DIAGNOSIS — E559 Vitamin D deficiency, unspecified: Secondary | ICD-10-CM | POA: Diagnosis not present

## 2020-04-06 DIAGNOSIS — E118 Type 2 diabetes mellitus with unspecified complications: Secondary | ICD-10-CM | POA: Diagnosis not present

## 2020-04-06 DIAGNOSIS — Z9189 Other specified personal risk factors, not elsewhere classified: Secondary | ICD-10-CM | POA: Diagnosis not present

## 2020-04-06 MED ORDER — RYBELSUS 7 MG PO TABS: 7.0000 mg | ORAL_TABLET | Freq: Every day | ORAL | 0 refills | Status: DC

## 2020-04-06 NOTE — Progress Notes (Signed)
Chief Complaint:   Erin [name] is here to discuss her progress with her obesity treatment plan along with follow-up of her obesity related diagnoses. Erin [name] is on the Category 3 Plan and states she is following her eating plan approximately 40% of the time. Erin [name] states she is walking at work.   Today's visit was #: 46 Starting weight: 330 lbs Starting date: 07/16/2018 Today's weight: 310 lbs Today's date: 04/06/2020 Total lbs lost to date: 20 Total lbs lost since last in-office visit: 0  Interim History: Erin [name] feels that she has not been eating on plan recently due to sciatica. She is eating breakfast consistently and eats a Healthy Choice meal for lunch.  Subjective:   Diabetes mellitus with complication ([place]). Bellina is on Rybelsus and denies polyphagia. Fasting blood sugars are in the range of 115 and 149.   Lab Results  Component Value Date   HGBA1C 7.5 12/09/2019   HGBA1C 7.4 (H) 08/10/2019   HGBA1C 8.0 (H) 04/14/2019   Lab Results  Component Value Date   MICROALBUR <0.7 12/09/2019   LDLCALC 68 04/14/2019   CREATININE 0.6 12/09/2019   Lab Results  Component Value Date   INSULIN 22.7 08/10/2019   INSULIN 16.3 04/14/2019   INSULIN 24.2 07/16/2018   Vitamin D deficiency. Erin [name] is on Vitamin D supplementation. No nausea, vomiting, or muscle weakness.    Ref. Range 08/10/2019 00:00  Vitamin D, 25-Hydroxy Latest Ref Range: 30.0 - 100.0 ng/mL 43.5   At risk for osteoporosis. Erin [name] is at higher risk of osteopenia and osteoporosis due to Vitamin D deficiency.   Assessment/Plan:   Diabetes mellitus with complication ([name]). Good blood sugar control is important to decrease the likelihood of diabetic complications such as nephropathy, neuropathy, limb loss, blindness, coronary artery disease, and death. Intensive lifestyle modification including diet, exercise and weight loss are the first line of treatment for diabetes. Refill was given for Semaglutide  (RYBELSUS) 7 MG TABS #30 with 0 refills.  Vitamin D deficiency. Low Vitamin D level contributes to fatigue and are associated with obesity, breast, and colon cancer. She agrees to continue to take Vitamin D as directed and will follow-up for routine testing of Vitamin D, at least 2-3 times per year to avoid over-replacement.  At risk for osteoporosis. Erin [name] was given approximately 15 minutes of osteoporosis prevention counseling today. Erin [name] is at risk for osteopenia and osteoporosis due to her Vitamin D deficiency. She was encouraged to take her Vitamin D and follow her higher calcium diet and increase strengthening exercise to help strengthen her bones and decrease her risk of osteopenia and osteoporosis.  Repetitive spaced learning was employed today to elicit superior memory formation and behavioral change.  Class 3 severe obesity with serious comorbidity and body mass index (BMI) of 45.0 to 49.9 in adult, unspecified obesity type ([place]).  Erin [name] is currently in the action stage of change. As such, her goal is to continue with weight loss efforts. She has agreed to the Category 3 Plan.   Exercise goals: For substantial health benefits, adults should do at least 150 minutes (2 hours and 30 minutes) a week of moderate-intensity, or 75 minutes (1 hour and 15 minutes) a week of vigorous-intensity aerobic physical activity, or an equivalent combination of moderate- and vigorous-intensity aerobic activity. Aerobic activity should be performed in episodes of at least 10 minutes, and preferably, it should be spread throughout the week.  Behavioral modification strategies: meal planning and cooking strategies and keeping healthy  foods in the home.  Erin [name] has agreed to follow-up with our clinic in 3 weeks. She was informed of the importance of frequent follow-up visits to maximize her success with intensive lifestyle modifications for her multiple health conditions.   Objective:   Blood pressure (!)  130/77, pulse 64, temperature 97.6 F (36.4 C), temperature source Oral, height 5\' 8"  (1.727 m), weight (!) 310 lb (140.6 kg), SpO2 99 %. Body mass index is 47.14 kg/m.  General: Cooperative, alert, well developed, in no acute distress. HEENT: Conjunctivae and lids unremarkable. Cardiovascular: Regular rhythm.  Lungs: Normal work of breathing. Neurologic: No focal deficits.   Lab Results  Component Value Date   CREATININE 0.6 12/09/2019   BUN 11 12/09/2019   NA 138 12/09/2019   K 3.9 12/09/2019   CL 101 12/09/2019   CO2 29 (A) 12/09/2019   Lab Results  Component Value Date   ALT 11 08/10/2019   AST 11 08/10/2019   ALKPHOS 91 08/10/2019   BILITOT <0.2 08/10/2019   Lab Results  Component Value Date   HGBA1C 7.5 12/09/2019   HGBA1C 7.4 (H) 08/10/2019   HGBA1C 8.0 (H) 04/14/2019   HGBA1C 8.6 (H) 07/16/2018   Lab Results  Component Value Date   INSULIN 22.7 08/10/2019   INSULIN 16.3 04/14/2019   INSULIN 24.2 07/16/2018   Lab Results  Component Value Date   TSH 3.690 07/16/2018   Lab Results  Component Value Date   CHOL 134 04/14/2019   HDL 47 04/14/2019   LDLCALC 68 04/14/2019   TRIG 95 04/14/2019   Lab Results  Component Value Date   WBC 7.4 12/09/2019   HGB 11.0 (A) 12/09/2019   HCT 35 (A) 12/09/2019   MCV 79 07/16/2018   PLT 339 12/09/2019   Lab Results  Component Value Date   IRON 38 (L) 12/20/2016   TIBC 244 12/20/2016   FERRITIN 590 (H) 12/20/2016   Attestation Statements:   Reviewed by clinician on day of visit: allergies, medications, problem list, medical history, surgical history, family history, social history, and previous encounter notes.  IMichaelene Song, am acting as transcriptionist for Abby Potash, PA-C   I have reviewed the above documentation for accuracy and completeness, and I agree with the above. Abby Potash, PA-C

## 2020-04-14 ENCOUNTER — Other Ambulatory Visit: Payer: Self-pay | Admitting: Cardiovascular Disease

## 2020-04-27 ENCOUNTER — Ambulatory Visit (INDEPENDENT_AMBULATORY_CARE_PROVIDER_SITE_OTHER): Payer: 59 | Admitting: Physician Assistant

## 2020-05-03 ENCOUNTER — Encounter: Payer: Self-pay | Admitting: Cardiovascular Disease

## 2020-05-03 ENCOUNTER — Ambulatory Visit: Payer: 59 | Admitting: Cardiovascular Disease

## 2020-05-03 ENCOUNTER — Other Ambulatory Visit: Payer: Self-pay

## 2020-05-03 VITALS — BP 132/70 | HR 64 | Ht 69.0 in | Wt 309.6 lb

## 2020-05-03 DIAGNOSIS — R0602 Shortness of breath: Secondary | ICD-10-CM | POA: Diagnosis not present

## 2020-05-03 DIAGNOSIS — I10 Essential (primary) hypertension: Secondary | ICD-10-CM

## 2020-05-03 NOTE — Patient Instructions (Addendum)
Medication Instructions:  STOP ASPIRIN   *If you need a refill on your cardiac medications before your next appointment, please call your pharmacy*  Lab Work: NONE   Testing/Procedures: NONE   Follow-Up: At [facility], you and your health needs are our priority.  As part of our continuing mission to provide you with exceptional heart care, we have created designated Provider Care Teams.  These Care Teams include your primary Cardiologist (physician) and Advanced Practice Providers (APPs -  Physician Assistants and Nurse Practitioners) who all work together to provide you with the care you need, when you need it.  We recommend signing up for the patient portal called "MyChart".  Sign up information is provided on this After Visit Summary.  MyChart is used to connect with patients for Virtual Visits (Telemedicine).  Patients are able to view lab/test results, encounter notes, upcoming appointments, etc.  Non-urgent messages can be sent to your provider as well.   To learn more about what you can do with MyChart, go to [email or web address].    Your next appointment:   12 month(s) You will receive a reminder letter in the mail two months in advance. If you don't receive a letter, please call our office to schedule the follow-up appointment.  The format for your next appointment:   In Person    Provider:   You may see DR [facility] or one of the following Advanced Practice Providers on your designated Care Team:    Kerin Ransom, PA-C  [place], [place]  Coletta Memos, [place]    Other Instructions  HOLD ROSUVASTATIN FOR 2 WEEKS. IF YOUR MUSCLE ACHES IMPROVE CALL THE OFFICE AT [phone]. IF NO IMPROVEMENT RESUME ROSUVASTATIN

## 2020-05-03 NOTE — Progress Notes (Signed)
Cardiology Office Note   Date:  05/03/2020   ID:  Erin [name], Erin [name] 15-Mar-1961, MRN [id number]  PCP:  Darcus Austin, MD (Inactive)  Cardiologist:   Skeet Latch, MD   No chief complaint on file.    History of Present Illness: Erin [name] is a 59 y.o. female with diabetes, hypertension, hyperlipidemia, venous insufficiency and morbid obesity who presents for follow up.  She was seen in clinic 06/13/16 for intermittent episodes of shortness of breath and chest discomfort.  The episodes occur randomly and not particularly with exertion. She was referred for an ETT 06/2016 that was negative for ischemia.  She achieved 7.6 METS on a Bruce protocol and was noted to have monomorphic PVCs during recovery.  She had lower extremity edema and was referred for an echo 11/07/16 that revealed LVEF 55-60% and was otherwise unremarkable.  She had a coronary CT-a 07/2028 that had a calcium score of 0 and no coronary artery disease.  She was started on metoprolol for PVCs.  Since her last appointment Erin [name] has been doing well.  She does a lot of walking at work but does not get much formal exercise.  She is limited by pain in her right foot and left leg.  She has no exertional chest pain or shortness of breath.  She has chronic lower extremity edema that is improved with elevation of her legs and compression stockings.  She continues to work 7 days a week but has limited her hours from 12 hours to 8 hours.  This helps but she is still very tired when she gets home from work.  Overall she is feeling well and is without complaint.  At home her blood pressure has been around 130 over 60s to 70s.  Past Medical History:  Diagnosis Date  . Anemia   . Atypical chest pain 06/13/2016  . Back pain   . Breast nodule 2009   Right   . Diabetes mellitus   . Fibroid    Symptomatic  . H/O dysmenorrhea   . H/O hematuria   . H/O hypercholesterolemia   . H/O: menorrhagia   . H/O: obesity   . HPV in female   .  Hypertension   . Microhematuria 2012  . Shortness of breath 06/13/2016  . Yeast vaginitis 2008    Past Surgical History:  Procedure Laterality Date  . ABDOMINAL HYSTERECTOMY    . HYSTEROSCOPY WITH D & C  2003  . POLYPECTOMY  2008  . TUBAL LIGATION  1984   Bilateral     Current Outpatient Medications  Medication Sig Dispense Refill  . calcium acetate (PHOSLO) 667 MG capsule Take 667 mg by mouth 1 day or 1 dose.    . ibuprofen (ADVIL,MOTRIN) 800 MG tablet TAKE 1 TABLET(S) BY MOUTH 2 TIMES A DAY AS NEEDED [PRN]  3  . losartan (COZAAR) 50 MG tablet Take 50 mg by mouth daily.  5  . metFORMIN (GLUCOPHAGE-XR) 500 MG 24 hr tablet Take 1,000 mg by mouth 2 (two) times daily.  5  . metoprolol tartrate (LOPRESSOR) 25 MG tablet TAKE 1 TABLET BY MOUTH TWICE A DAY 180 tablet 1  . Multiple Vitamin (MULTIVITAMIN) capsule Take 1 capsule by mouth daily.    . rosuvastatin (CRESTOR) 10 MG tablet Take 1 tablet (10 mg total) by mouth daily. Need ov. 90 tablet 0  . Semaglutide (RYBELSUS) 7 MG TABS Take 7 mg by mouth daily. 30 tablet 0  . vitamin C (ASCORBIC  ACID) 500 MG tablet Take 500 mg by mouth daily.    . Vitamin D, Ergocalciferol, (DRISDOL) 1.25 MG (50000 UNIT) CAPS capsule Take 1 capsule (50,000 Units total) by mouth every 14 (fourteen) days. 2 capsule 0   No current facility-administered medications for this visit.    Allergies:   Patient has no known allergies.    Social History:  The patient  reports that she has never smoked. She has never used smokeless tobacco. She reports that she does not drink alcohol and does not use drugs.   Family History:  The patient's family history includes Diabetes in her brother and mother; Hypertension in her father; Stroke in her father; Valvular heart disease in her mother.    ROS:  Please see the history of present illness.   Otherwise, review of systems are positive for none.   All other systems are reviewed and negative.    PHYSICAL EXAM: VS:  BP  132/70   Pulse 64   Ht 5\' 9"  (1.753 m)   Wt (!) 309 lb 9.6 oz (140.4 kg)   SpO2 99%   BMI 45.72 kg/m  , BMI Body mass index is 45.72 kg/m. GENERAL:  Well appearing HEENT: Pupils equal round and reactive, fundi not visualized, oral mucosa unremarkable NECK:  No jugular venous distention, waveform within normal limits, carotid upstroke brisk and symmetric, no bruits, no thyromegaly LYMPHATICS:  No cervical adenopathy LUNGS:  Clear to auscultation bilaterally HEART:  RRR.  PMI not displaced or sustained,S1 and S2 within normal limits, no S3, no S4, no clicks, no rubs, II/VI systolic murmur at the LUSB ABD:  Flat, positive bowel sounds normal in frequency in pitch, no bruits, no rebound, no guarding, no midline pulsatile mass, no hepatomegaly, no splenomegaly EXT:  2 plus pulses throughout, 1+ bilateral LE edema, no cyanosis no clubbing SKIN:  No rashes no nodules NEURO:  Cranial nerves II through XII grossly intact, motor grossly intact throughout PSYCH:  Cognitively intact, oriented to person place and time  EKG:  EKG is ordered today. 05/16/16: Sinus rhythm. Rate 68 bpm. Voltage. 05/28/18:: Sinus rhythm.  Rate 70 bpm.  Low voltage.   05/03/2020: Sinus rhythm.  Rate 64 bpm.  Low voltage.  ETT 06/21/16:  Blood pressure demonstrated a normal response to exercise.  There was no ST segment deviation noted during stress.  No T wave inversion was noted during stress.   Normal ECG stress test. Frequent monomorphic PVCs are seen during recovery  Echo 11/08/15: Study Conclusions  - Left ventricle: The cavity size was mildly dilated. Systolic   function was normal. The estimated ejection fraction was in the   range of 55% to 60%. Wall motion was normal; there were no   regional wall motion abnormalities. Left ventricular diastolic   function parameters were normal. - Aortic valve: Trileaflet; mildly thickened, mildly calcified   leaflets. - Mitral valve: There was trivial  regurgitation. - Left atrium: The atrium was mildly dilated. - Pulmonic valve: There was trivial regurgitation. - Pulmonary arteries: Systolic pressure could not be accurately   estimated.  Recent Labs: 08/10/2019: ALT 11 12/09/2019: BUN 11; Creatinine 0.6; Hemoglobin 11.0; Platelets 339; Potassium 3.9; Sodium 138   05/16/16: Sodium 138, potassium 3.9, BUN 10, creatinine 0.55 03/20/16: Hemoglobin A1c 7.3% WBC 8.2, hemoglobin 11.2, hematocrit 35.7, platelets 328 Total cholesterol 179, triglycerides 90, HDL 48, LDL 113  10/04/16: Sodium 139, potassium 4.1, BUN 12, creatinine 0.54 Total cholesterol 138, triglycerides 78, HDL 43, LDL 80 A1c 8.0%  05/29/2017: Total cholesterol 148, triglycerides 88, HDL 46, LDL 85.  12/09/2019: Hemoglobin A1c 7.5% Sodium 138, potassium 3.9, BUN 11, creatinine 0.58 WBC 7.4, hemoglobin 11.0, hematocrit 34.7, platelets 239  Lipid Panel    Component Value Date/Time   CHOL 134 04/14/2019 1544   TRIG 95 04/14/2019 1544   HDL 47 04/14/2019 1544   LDLCALC 68 04/14/2019 1544      Wt Readings from Last 3 Encounters:  05/03/20 (!) 309 lb 9.6 oz (140.4 kg)  04/06/20 (!) 310 lb (140.6 kg)  03/16/20 (!) 308 lb (139.7 kg)      ASSESSMENT AND PLAN:  # PVCs:   Resolved on metoprolol.    # Atypical chest pain:  Resolved.  Coronary CT-a was negative for coronary artery disease 07/2018.  Stop aspirin.  # Hypertension:  BP blood pressure well-controlled on losartan and metoprolol.  No changes.  She was encouraged to increase her exercise.  # Hyperlipidemia: Lipids are well-controlled.  Continue rosuvastatin.  She has joint pains that seem to be more orthopedic related.  I did tell her it is okay to hold her rosuvastatin for a week or 2 to see if this helps.  If her pain improves she will call and let us know so we can choose an alternative agent.  If she continues to have pain she needs to resume rosuvastatin.  # Morbid obesity:  Continue following up with a  healthy weight and wellness clinic.  # LE Edema: Due to prolonged sitting and venous insufficiency.  Recommend compression stockings and elevation.    Current medicines are reviewed at length with the patient today.  The patient does not have concerns regarding medicines.  The following changes have been made:  None  Labs/ tests ordered today include:   Orders Placed This Encounter  Procedures  . EKG 12-Lead     Disposition:   FU with Erin Lewis C. Oval Linsey, MD, [facility] in 1 year.    Signed, Erin Rorke C. Oval Linsey, MD, [facility]  05/03/2020 1:56 PM    [place]

## 2020-05-05 ENCOUNTER — Other Ambulatory Visit (INDEPENDENT_AMBULATORY_CARE_PROVIDER_SITE_OTHER): Payer: Self-pay | Admitting: Physician Assistant

## 2020-05-05 DIAGNOSIS — E118 Type 2 diabetes mellitus with unspecified complications: Secondary | ICD-10-CM

## 2020-05-09 ENCOUNTER — Encounter (INDEPENDENT_AMBULATORY_CARE_PROVIDER_SITE_OTHER): Payer: Self-pay | Admitting: Physician Assistant

## 2020-05-09 ENCOUNTER — Other Ambulatory Visit: Payer: Self-pay

## 2020-05-09 ENCOUNTER — Ambulatory Visit (INDEPENDENT_AMBULATORY_CARE_PROVIDER_SITE_OTHER): Payer: 59 | Admitting: Physician Assistant

## 2020-05-09 VITALS — BP 114/61 | HR 69 | Temp 98.0°F | Ht 69.0 in | Wt 306.0 lb

## 2020-05-09 DIAGNOSIS — E785 Hyperlipidemia, unspecified: Secondary | ICD-10-CM

## 2020-05-09 DIAGNOSIS — E1169 Type 2 diabetes mellitus with other specified complication: Secondary | ICD-10-CM | POA: Diagnosis not present

## 2020-05-09 DIAGNOSIS — I10 Essential (primary) hypertension: Secondary | ICD-10-CM

## 2020-05-09 DIAGNOSIS — Z6841 Body Mass Index (BMI) 40.0 and over, adult: Secondary | ICD-10-CM

## 2020-05-09 DIAGNOSIS — Z9189 Other specified personal risk factors, not elsewhere classified: Secondary | ICD-10-CM

## 2020-05-09 MED ORDER — RYBELSUS 7 MG PO TABS: 7.0000 mg | ORAL_TABLET | Freq: Every day | ORAL | 0 refills | Status: DC

## 2020-05-09 NOTE — Progress Notes (Signed)
Chief Complaint:   Erin [name] is here to discuss her progress with her obesity treatment plan along with follow-up of her obesity related diagnoses. Erin [name] is on the Category 3 Plan and states she is following her eating plan approximately 50% of the time. Erin [name] states she is walking at work 7 times per week.  Today's visit was #: 55 Starting weight: 330 lbs Starting date: 07/16/2018 Today's weight: 306 lbs Today's date: 05/09/2020 Total lbs lost to date: 24 Total lbs lost since last in-office visit: 4  Interim History: Erin [name] states there are some mornings that she wakes up late, which puts her behind schedule and then she does not eat all of the food on the plan. She notes her hunger is controlled.  Subjective:   Essential hypertension. Erin [name] is on Cozaar and Lopressor. Blood pressure is controlled.  BP Readings from Last 3 Encounters:  05/09/20 114/61  05/03/20 132/70  04/06/20 (!) 130/77   Lab Results  Component Value Date   CREATININE 0.6 12/09/2019   CREATININE 0.54 (L) 08/10/2019   CREATININE 0.51 (L) 04/14/2019   Type 2 diabetes mellitus with hyperlipidemia ([place]). Erin [name] is on Rybelsus and metformin. Fasting blood sugars range between 120 and 150.  Lab Results  Component Value Date   HGBA1C 7.5 12/09/2019   HGBA1C 7.4 (H) 08/10/2019   HGBA1C 8.0 (H) 04/14/2019   Lab Results  Component Value Date   MICROALBUR <0.7 12/09/2019   LDLCALC 68 04/14/2019   CREATININE 0.6 12/09/2019   Lab Results  Component Value Date   INSULIN 22.7 08/10/2019   INSULIN 16.3 04/14/2019   INSULIN 24.2 07/16/2018   At risk for complication associated with hypotension. The patient is at a higher than average risk of hypotension due to weight loss.  Assessment/Plan:   Essential hypertension. Erin [name] is working on healthy weight loss and exercise to improve blood pressure control. We will watch for signs of hypotension as she continues her lifestyle modifications.  She will continue her medications as directed.   Type 2 diabetes mellitus with hyperlipidemia ([place]). Good blood sugar control is important to decrease the likelihood of diabetic complications such as nephropathy, neuropathy, limb loss, blindness, coronary artery disease, and death. Intensive lifestyle modification including diet, exercise and weight loss are the first line of treatment for diabetes. Refill was given for Semaglutide (RYBELSUS) 7 MG TABS #30 with 0 refills.  At risk for complication associated with hypotension. Erin [name] was given approximately 15 minutes of education and counseling today to help avoid hypotension. We discussed risks of hypotension with weight loss and signs of hypotension such as feeling lightheaded or unsteady.  Repetitive spaced learning was employed today to elicit superior memory formation and behavioral change.  Class 3 severe obesity with serious comorbidity and body mass index (BMI) of 45.0 to 49.9 in adult, unspecified obesity type ([place]).  Erin [name] is currently in the action stage of change. As such, her goal is to continue with weight loss efforts. She has agreed to the Category 3 Plan.   Handout was provided on Eating Out Guide.  Exercise goals: For substantial health benefits, adults should do at least 150 minutes (2 hours and 30 minutes) a week of moderate-intensity, or 75 minutes (1 hour and 15 minutes) a week of vigorous-intensity aerobic physical activity, or an equivalent combination of moderate- and vigorous-intensity aerobic activity. Aerobic activity should be performed in episodes of at least 10 minutes, and preferably, it should be spread throughout the week.  Behavioral modification strategies: increasing lean protein intake and no skipping meals.  Erin [name] has agreed to follow-up with our clinic in 3 weeks for fasting labs. She was informed of the importance of frequent follow-up visits to maximize her success with intensive lifestyle modifications  for her multiple health conditions.   Objective:   Blood pressure 114/61, pulse 69, temperature 98 F (36.7 C), temperature source Oral, height 5\' 9"  (1.753 m), weight (!) 306 lb (138.8 kg), SpO2 100 %. Body mass index is 45.19 kg/m.  General: Cooperative, alert, well developed, in no acute distress. HEENT: Conjunctivae and lids unremarkable. Cardiovascular: Regular rhythm.  Lungs: Normal work of breathing. Neurologic: No focal deficits.   Lab Results  Component Value Date   CREATININE 0.6 12/09/2019   BUN 11 12/09/2019   NA 138 12/09/2019   K 3.9 12/09/2019   CL 101 12/09/2019   CO2 29 (A) 12/09/2019   Lab Results  Component Value Date   ALT 11 08/10/2019   AST 11 08/10/2019   ALKPHOS 91 08/10/2019   BILITOT <0.2 08/10/2019   Lab Results  Component Value Date   HGBA1C 7.5 12/09/2019   HGBA1C 7.4 (H) 08/10/2019   HGBA1C 8.0 (H) 04/14/2019   HGBA1C 8.6 (H) 07/16/2018   Lab Results  Component Value Date   INSULIN 22.7 08/10/2019   INSULIN 16.3 04/14/2019   INSULIN 24.2 07/16/2018   Lab Results  Component Value Date   TSH 3.690 07/16/2018   Lab Results  Component Value Date   CHOL 134 04/14/2019   HDL 47 04/14/2019   LDLCALC 68 04/14/2019   TRIG 95 04/14/2019   Lab Results  Component Value Date   WBC 7.4 12/09/2019   HGB 11.0 (A) 12/09/2019   HCT 35 (A) 12/09/2019   MCV 79 07/16/2018   PLT 339 12/09/2019   Lab Results  Component Value Date   IRON 38 (L) 12/20/2016   TIBC 244 12/20/2016   FERRITIN 590 (H) 12/20/2016   Attestation Statements:   Reviewed by clinician on day of visit: allergies, medications, problem list, medical history, surgical history, family history, social history, and previous encounter notes.  IMichaelene Song, am acting as transcriptionist for Abby Potash, PA-C   I have reviewed the above documentation for accuracy and completeness, and I agree with the above. Abby Potash, PA-C

## 2020-05-29 ENCOUNTER — Other Ambulatory Visit: Payer: Self-pay | Admitting: Cardiovascular Disease

## 2020-05-29 NOTE — Telephone Encounter (Signed)
Rx has been sent to the pharmacy electronically. ° °

## 2020-05-30 ENCOUNTER — Ambulatory Visit (INDEPENDENT_AMBULATORY_CARE_PROVIDER_SITE_OTHER): Payer: 59 | Admitting: Physician Assistant

## 2020-05-30 ENCOUNTER — Other Ambulatory Visit: Payer: Self-pay

## 2020-05-30 ENCOUNTER — Encounter (INDEPENDENT_AMBULATORY_CARE_PROVIDER_SITE_OTHER): Payer: Self-pay | Admitting: Physician Assistant

## 2020-05-30 VITALS — BP 121/64 | HR 63 | Temp 97.8°F | Ht 69.0 in | Wt 309.0 lb

## 2020-05-30 DIAGNOSIS — E1169 Type 2 diabetes mellitus with other specified complication: Secondary | ICD-10-CM | POA: Diagnosis not present

## 2020-05-30 DIAGNOSIS — E559 Vitamin D deficiency, unspecified: Secondary | ICD-10-CM

## 2020-05-30 DIAGNOSIS — E7849 Other hyperlipidemia: Secondary | ICD-10-CM | POA: Diagnosis not present

## 2020-05-30 DIAGNOSIS — Z9189 Other specified personal risk factors, not elsewhere classified: Secondary | ICD-10-CM

## 2020-05-30 DIAGNOSIS — E785 Hyperlipidemia, unspecified: Secondary | ICD-10-CM

## 2020-05-30 DIAGNOSIS — Z6841 Body Mass Index (BMI) 40.0 and over, adult: Secondary | ICD-10-CM

## 2020-05-30 MED ORDER — RYBELSUS 7 MG PO TABS: 7.0000 mg | ORAL_TABLET | Freq: Every day | ORAL | 0 refills | Status: DC

## 2020-05-30 MED ORDER — VITAMIN D (ERGOCALCIFEROL) 1.25 MG (50000 UNIT) PO CAPS: 50000.0000 [IU] | ORAL_CAPSULE | ORAL | 0 refills | Status: DC

## 2020-05-31 LAB — COMPREHENSIVE METABOLIC PANEL
ALT: 13 IU/L (ref 0–32)
AST: 13 IU/L (ref 0–40)
Albumin/Globulin Ratio: 1.8 (ref 1.2–2.2)
Albumin: 4.2 g/dL (ref 3.8–4.9)
Alkaline Phosphatase: 86 IU/L (ref 44–121)
BUN/Creatinine Ratio: 15 (ref 9–23)
BUN: 8 mg/dL (ref 6–24)
Bilirubin Total: 0.2 mg/dL (ref 0.0–1.2)
CO2: 27 mmol/L (ref 20–29)
Calcium: 9.6 mg/dL (ref 8.7–10.2)
Chloride: 102 mmol/L (ref 96–106)
Creatinine, Ser: 0.55 mg/dL — ABNORMAL LOW (ref 0.57–1.00)
GFR calc Af Amer: 120 mL/min/{1.73_m2} (ref >59)
GFR calc non Af Amer: 104 mL/min/{1.73_m2} (ref >59)
Globulin, Total: 2.3 g/dL (ref 1.5–4.5)
Glucose: 94 mg/dL (ref 65–99)
Potassium: 4.2 mmol/L (ref 3.5–5.2)
Sodium: 142 mmol/L (ref 134–144)
Total Protein: 6.5 g/dL (ref 6.0–8.5)

## 2020-05-31 LAB — HEMOGLOBIN A1C
Est. average glucose Bld gHb Est-mCnc: 160 mg/dL
Hgb A1c MFr Bld: 7.2 % — ABNORMAL HIGH (ref 4.8–5.6)

## 2020-05-31 LAB — VITAMIN D 25 HYDROXY (VIT D DEFICIENCY, FRACTURES): Vit D, 25-Hydroxy: 39.5 ng/mL (ref 30.0–100.0)

## 2020-05-31 LAB — LIPID PANEL
Chol/HDL Ratio: 2.9 ratio (ref 0.0–4.4)
Cholesterol, Total: 133 mg/dL (ref 100–199)
HDL: 46 mg/dL (ref >39)
LDL Chol Calc (NIH): 70 mg/dL (ref 0–99)
Triglycerides: 88 mg/dL (ref 0–149)
VLDL Cholesterol Cal: 17 mg/dL (ref 5–40)

## 2020-05-31 LAB — INSULIN, RANDOM: INSULIN: 17.3 u[IU]/mL (ref 2.6–24.9)

## 2020-06-05 NOTE — Progress Notes (Signed)
Chief Complaint:   OBESITY Erin [name] is here to discuss her progress with her obesity treatment plan along with follow-up of her obesity related diagnoses. Erin [name] is on the Category 3 Plan and states she is following her eating plan approximately 70% of the time. Erin [name] states she is exercising for 0 minutes 0 times per week.  Today's visit was #: 68 Starting weight: 330 lbs Starting date: 07/16/2018 Today's weight: 309 lbs Today's date: 05/30/2020 Total lbs lost to date: 21 lbs Total lbs lost since last in-office visit: 0  Interim History: Erin [name] just returned from the beach, where she ate out every meal.  She indulged in pizza, subs, and other take out foods.  She is back on track the last few days.  She is not eating enough veggies.  Subjective:   1. Type 2 diabetes mellitus with hyperlipidemia (HCC) Medications reviewed. Diabetic ROS: no polyuria or polydipsia, no chest pain, dyspnea or TIA's, no numbness, tingling or pain in extremities.  Fasting blood sugars range from 118 to 160.  Before meals:  99-162.  She is on Rybelsus.  Lab Results  Component Value Date   HGBA1C 7.2 (H) 05/30/2020   HGBA1C 7.5 12/09/2019   HGBA1C 7.4 (H) 08/10/2019   Lab Results  Component Value Date   MICROALBUR <0.7 12/09/2019   LDLCALC 70 05/30/2020   CREATININE 0.55 (L) 05/30/2020   Lab Results  Component Value Date   INSULIN 17.3 05/30/2020   INSULIN 22.7 08/10/2019   INSULIN 16.3 04/14/2019   INSULIN 24.2 07/16/2018   2. Vitamin D deficiency Erin [name]'s Vitamin D level was 43.5 on 08/10/2019. She is currently taking prescription vitamin D 50,000 IU each week. She denies nausea, vomiting or muscle weakness.  Tolerating well.  3. Other hyperlipidemia Erin [name] has hyperlipidemia and has been trying to improve her cholesterol levels with intensive lifestyle modification including a low saturated fat diet, exercise and weight loss. She denies any chest pain, claudication or myalgias.  She is on  Crestor.  Tolerating well.  Lab Results  Component Value Date   ALT 13 05/30/2020   AST 13 05/30/2020   ALKPHOS 86 05/30/2020   BILITOT 0.2 05/30/2020   Lab Results  Component Value Date   CHOL 133 05/30/2020   HDL 46 05/30/2020   LDLCALC 70 05/30/2020   TRIG 88 05/30/2020   CHOLHDL 2.9 05/30/2020   4. At risk for heart disease Erin [name] is at a higher than average risk for cardiovascular disease due to obesity.   Assessment/Plan:   1. Type 2 diabetes mellitus with hyperlipidemia (HCC) Good blood sugar control is important to decrease the likelihood of diabetic complications such as nephropathy, neuropathy, limb loss, blindness, coronary artery disease, and death. Intensive lifestyle modification including diet, exercise and weight loss are the first line of treatment for diabetes.    - Comprehensive metabolic panel - Hemoglobin A1c - Insulin, random  -Refill Semaglutide (RYBELSUS) 7 MG TABS; Take 7 mg by mouth daily.  Dispense: 30 tablet; Refill: 0  2. Vitamin D deficiency Low Vitamin D level contributes to fatigue and are associated with obesity, breast, and colon cancer. She agrees to continue to take prescription Vitamin D @50 ,000 IU every week and will follow-up for routine testing of Vitamin D, at least 2-3 times per year to avoid over-replacement.  - VITAMIN D 25 Hydroxy (Vit-D Deficiency, Fractures)  -Refill Vitamin D, Ergocalciferol, (DRISDOL) 1.25 MG (50000 UNIT) CAPS capsule; Take 1 capsule (50,000 Units total) by mouth every 14 (  fourteen) days.  Dispense: 2 capsule; Refill: 0  3. Other hyperlipidemia Cardiovascular risk and specific lipid/LDL goals reviewed.  We discussed several lifestyle modifications today and Erin [name] will continue to work on diet, exercise and weight loss efforts. Orders and follow up as documented in patient record.  Continue medication and will check labs today.  Counseling Intensive lifestyle modifications are the first line treatment for this  issue. . Dietary changes: Increase soluble fiber. Decrease simple carbohydrates. . Exercise changes: Moderate to vigorous-intensity aerobic activity 150 minutes per week if tolerated. . Lipid-lowering medications: see documented in medical record.  - Lipid panel  4. At risk for heart disease Erin [name] was given approximately 15 minutes of coronary artery disease prevention counseling today. She is 59 y.o. female and has risk factors for heart disease including obesity. We discussed intensive lifestyle modifications today with an emphasis on specific weight loss instructions and strategies.   Repetitive spaced learning was employed today to elicit superior memory formation and behavioral change.  5. Class 3 severe obesity with serious comorbidity and body mass index (BMI) of 45.0 to 49.9 in adult, unspecified obesity type Erin [name]) Erin [name] is currently in the action stage of change. As such, her goal is to continue with weight loss efforts. She has agreed to the Category 3 Plan.   Exercise goals: For substantial health benefits, adults should do at least 150 minutes (2 hours and 30 minutes) a week of moderate-intensity, or 75 minutes (1 hour and 15 minutes) a week of vigorous-intensity aerobic physical activity, or an equivalent combination of moderate- and vigorous-intensity aerobic activity. Aerobic activity should be performed in episodes of at least 10 minutes, and preferably, it should be spread throughout the week.  Behavioral modification strategies: increasing lean protein intake and increasing vegetables.  Erin [name] has agreed to follow-up with our clinic in 3 weeks. She was informed of the importance of frequent follow-up visits to maximize her success with intensive lifestyle modifications for her multiple health conditions.   Erin [name] was informed we would discuss her lab results at her next visit unless there is a critical issue that needs to be addressed sooner. Erin [name] agreed to keep her next  visit at the agreed upon time to discuss these results.  Objective:   Blood pressure 121/64, pulse 63, temperature 97.8 F (36.6 C), height 5\' 9"  (1.753 m), weight (!) 309 lb (140.2 kg), SpO2 100 %. Body mass index is 45.63 kg/m.  General: Cooperative, alert, well developed, in no acute distress. HEENT: Conjunctivae and lids unremarkable. Cardiovascular: Regular rhythm.  Lungs: Normal work of breathing. Neurologic: No focal deficits.   Lab Results  Component Value Date   CREATININE 0.55 (L) 05/30/2020   BUN 8 05/30/2020   NA 142 05/30/2020   K 4.2 05/30/2020   CL 102 05/30/2020   CO2 27 05/30/2020   Lab Results  Component Value Date   ALT 13 05/30/2020   AST 13 05/30/2020   ALKPHOS 86 05/30/2020   BILITOT 0.2 05/30/2020   Lab Results  Component Value Date   HGBA1C 7.2 (H) 05/30/2020   HGBA1C 7.5 12/09/2019   HGBA1C 7.4 (H) 08/10/2019   HGBA1C 8.0 (H) 04/14/2019   HGBA1C 8.6 (H) 07/16/2018   Lab Results  Component Value Date   INSULIN 17.3 05/30/2020   INSULIN 22.7 08/10/2019   INSULIN 16.3 04/14/2019   INSULIN 24.2 07/16/2018   Lab Results  Component Value Date   TSH 3.690 07/16/2018   Lab Results  Component Value Date  CHOL 133 05/30/2020   HDL 46 05/30/2020   LDLCALC 70 05/30/2020   TRIG 88 05/30/2020   CHOLHDL 2.9 05/30/2020   Lab Results  Component Value Date   WBC 7.4 12/09/2019   HGB 11.0 (A) 12/09/2019   HCT 35 (A) 12/09/2019   MCV 79 07/16/2018   PLT 339 12/09/2019   Lab Results  Component Value Date   IRON 38 (L) 12/20/2016   TIBC 244 12/20/2016   FERRITIN 590 (H) 12/20/2016   Attestation Statements:   Reviewed by clinician on day of visit: allergies, medications, problem list, medical history, surgical history, family history, social history, and previous encounter notes.  I, Water quality scientist, CMA, am acting as transcriptionist for Abby Potash, PA-C  I have reviewed the above documentation for accuracy and completeness, and I  agree with the above. Abby Potash, PA-C

## 2020-06-13 ENCOUNTER — Encounter: Payer: Self-pay | Admitting: Orthopaedic Surgery

## 2020-06-13 ENCOUNTER — Ambulatory Visit (INDEPENDENT_AMBULATORY_CARE_PROVIDER_SITE_OTHER): Payer: Worker's Compensation | Admitting: Orthopaedic Surgery

## 2020-06-13 VITALS — BP 123/78 | HR 73 | Ht 68.0 in | Wt 311.6 lb

## 2020-06-13 DIAGNOSIS — M5126 Other intervertebral disc displacement, lumbar region: Secondary | ICD-10-CM

## 2020-06-13 NOTE — Progress Notes (Signed)
Office Visit Note   Patient: Erin [name]           Date of Birth: [birth year]           MRN: [id number] Visit Date: 06/13/2020              Requested by: No referring provider defined for this encounter. PCP: Darcus Austin, MD (Inactive)   Assessment & Plan: Visit Diagnoses:  1. Protrusion of lumbar intervertebral disc   2. Morbid (severe) obesity due to excess calories ([place])     Plan: The patient meets the AMA guidelines for Morbid (severe) obesity with a BMI > 40.0 and I have recommended weight loss. She states she has to walk long ways to get in the building cuff time she is got halfway to the building and then turned around with Dr. Robina Ade and called in instead of going in.  She requested a handicap sticker which we supplied x6 months.  Recheck 6 months.  We again discussed continue to work on dieting, weight loss to help unload her back.  If radicular symptoms increase she will let us know.  Follow-Up Instructions: Return in about 6 months (around 12/12/2020).   Orders:  No orders of the defined types were placed in this encounter.  No orders of the defined types were placed in this encounter.     Procedures: No procedures performed   Clinical Data: No additional findings.   Subjective: Chief Complaint  Patient presents with  . Lower Back - Pain    HPI follow-up Worker's Comp. injury with injuries to her back December 2016 and January 2019.  She has had MRIs in the past most recent 2019 or possibly early 2020.  Patient had disc degeneration L2-3 with some extraforaminal disc protrusion on the left side and has had persistent left anterior thigh numbness and burning.  Patient does have diabetes A1c is less than 7.5 and she is trying to work on weight loss mostly unsuccessful.  In May weight was 305 now 311.  No associated bowel bladder symptoms.  She still continues to work long hours at work sometimes 6 7 days a week up to 12 hours.  Patient was referred to weight  loss clinic they were doing televisits.  Her A1c is better but her weight remains up.  Review of Systems minus for morbid obesity, type 2 diabetes, hypertension.  Back injury with lumbar disc protrusion L2-3 left.   Objective: Vital Signs: BP 123/78   Pulse 73   Ht 5\' 8"  (1.727 m)   Wt (!) 311 lb 9.6 oz (141.3 kg)   BMI 47.38 kg/m   Physical Exam Constitutional:      Appearance: She is well-developed.  HENT:     Head: Normocephalic.     Right Ear: External ear normal.     Left Ear: External ear normal.  Eyes:     Pupils: Pupils are equal, round, and reactive to light.  Neck:     Thyroid: No thyromegaly.     Trachea: No tracheal deviation.  Cardiovascular:     Rate and Rhythm: Normal rate.  Pulmonary:     Effort: Pulmonary effort is normal.  Abdominal:     Palpations: Abdomen is soft.  Skin:    General: Skin is warm and dry.  Neurological:     Mental Status: She is alert and oriented to person, place, and time.  Psychiatric:        Behavior: Behavior normal.  Ortho Exam patient ambulates and has slight flexion at the waist when she weightbears on the left.  She has good quad strength with manual hand testing but some weakness with step up.  No weakness on the right side.  Anterior tib gastrocsoleus is strong and symmetrical.  Specialty Comments:  No specialty comments available.  Imaging: No results found.   PMFS History: Patient Active Problem List   Diagnosis Date Noted  . Protrusion of lumbar intervertebral disc 01/12/2020  . Low back pain 05/11/2019  . Morbid (severe) obesity due to excess calories ([place]) 11/25/2016  . Shortness of breath 06/13/2016  . Atypical chest pain 06/13/2016  . Anemia of chronic disease 03/08/2015  . High blood pressure 07/22/2012  . Diabetes mellitus ([place]) 07/22/2012   Past Medical History:  Diagnosis Date  . Anemia   . Atypical chest pain 06/13/2016  . Back pain   . Breast nodule 2009   Right   . Diabetes mellitus   .  Fibroid    Symptomatic  . H/O dysmenorrhea   . H/O hematuria   . H/O hypercholesterolemia   . H/O: menorrhagia   . H/O: obesity   . HPV in female   . Hypertension   . Microhematuria 2012  . Shortness of breath 06/13/2016  . Yeast vaginitis 2008    Family History  Problem Relation Age of Onset  . Diabetes Mother   . Valvular heart disease Mother   . Diabetes Brother   . Hypertension Father   . Stroke Father     Past Surgical History:  Procedure Laterality Date  . ABDOMINAL HYSTERECTOMY    . HYSTEROSCOPY WITH D & C  2003  . POLYPECTOMY  2008  . TUBAL LIGATION  1984   Bilateral   Social History   Occupational History  . Occupation: Glass blower/designer  Tobacco Use  . Smoking status: Never Smoker  . Smokeless tobacco: Never Used  Vaping Use  . Vaping Use: Never used  Substance and Sexual Activity  . Alcohol use: No  . Drug use: No  . Sexual activity: Never    Comment: hysterectomy

## 2020-06-20 ENCOUNTER — Ambulatory Visit (INDEPENDENT_AMBULATORY_CARE_PROVIDER_SITE_OTHER): Payer: 59 | Admitting: Physician Assistant

## 2020-06-21 ENCOUNTER — Other Ambulatory Visit (INDEPENDENT_AMBULATORY_CARE_PROVIDER_SITE_OTHER): Payer: Self-pay | Admitting: Bariatrics

## 2020-06-21 ENCOUNTER — Ambulatory Visit (INDEPENDENT_AMBULATORY_CARE_PROVIDER_SITE_OTHER): Payer: 59 | Admitting: Physician Assistant

## 2020-06-21 ENCOUNTER — Encounter (INDEPENDENT_AMBULATORY_CARE_PROVIDER_SITE_OTHER): Payer: Self-pay | Admitting: Physician Assistant

## 2020-06-21 ENCOUNTER — Other Ambulatory Visit: Payer: Self-pay

## 2020-06-21 VITALS — BP 115/68 | HR 59 | Temp 97.8°F | Ht 69.0 in | Wt 306.0 lb

## 2020-06-21 DIAGNOSIS — E785 Hyperlipidemia, unspecified: Secondary | ICD-10-CM | POA: Diagnosis not present

## 2020-06-21 DIAGNOSIS — E1169 Type 2 diabetes mellitus with other specified complication: Secondary | ICD-10-CM

## 2020-06-21 DIAGNOSIS — Z9189 Other specified personal risk factors, not elsewhere classified: Secondary | ICD-10-CM | POA: Diagnosis not present

## 2020-06-21 DIAGNOSIS — E559 Vitamin D deficiency, unspecified: Secondary | ICD-10-CM

## 2020-06-21 DIAGNOSIS — Z6841 Body Mass Index (BMI) 40.0 and over, adult: Secondary | ICD-10-CM

## 2020-06-21 MED ORDER — VITAMIN D (ERGOCALCIFEROL) 1.25 MG (50000 UNIT) PO CAPS: 50000.0000 [IU] | ORAL_CAPSULE | ORAL | 0 refills | Status: DC

## 2020-06-21 MED ORDER — RYBELSUS 14 MG PO TABS: 14.0000 mg | ORAL_TABLET | Freq: Every day | ORAL | 0 refills | Status: DC

## 2020-06-21 NOTE — Progress Notes (Signed)
Chief Complaint:   Erin [name] is here to discuss her progress with her obesity treatment plan along with follow-up of her obesity related diagnoses. Erin [name] is on the Category 3 Plan and states she is following her eating plan approximately 70% of the time. Erin [name] states she is exercising 0 minutes 0 times per week.  Today's visit was #: 51 Starting weight: 330 lbs Starting date: 07/16/2018 Today's weight: 306 lbs Today's date: 06/21/2020 Total lbs lost to date: 24 Total lbs lost since last in-office visit: 3  Interim History: Erin [name] reports that she was at home over vacation and cooked more often. She did not prep any meals for work.  Subjective:   Vitamin D deficiency. Erin [name] is on Vitamin D twice a month. Last level was not at goal. Labs were discussed with the patient today.    Ref. Range 05/30/2020 10:41  Vitamin D, 25-Hydroxy Latest Ref Range: 30.0 - 100.0 ng/mL 39.5   Type 2 diabetes mellitus with hyperlipidemia ([place]). Last A1c 7.2, improved from 7.5. Erin [name] is managed by her PCP. Labs were discussed with the patient today.   Lab Results  Component Value Date   HGBA1C 7.2 (H) 05/30/2020   HGBA1C 7.5 12/09/2019   HGBA1C 7.4 (H) 08/10/2019   Lab Results  Component Value Date   MICROALBUR <0.7 12/09/2019   LDLCALC 70 05/30/2020   CREATININE 0.55 (L) 05/30/2020   Lab Results  Component Value Date   INSULIN 17.3 05/30/2020   INSULIN 22.7 08/10/2019   INSULIN 16.3 04/14/2019   INSULIN 24.2 07/16/2018   At risk for hypoglycemia. Erin [name] is at increased risk for hypoglycemia due to changes in diet, diagnosis of diabetes, and/or insulin use.   Assessment/Plan:   Vitamin D deficiency. Low Vitamin D level contributes to fatigue and are associated with obesity, breast, and colon cancer. She will increase Vitamin D, Ergocalciferol, (DRISDOL) 1.25 MG (50000 UNIT) CAPS capsule every week #4 with 0 refills and will follow-up for routine testing of Vitamin D, at  least 2-3 times per year to avoid over-replacement.   Type 2 diabetes mellitus with hyperlipidemia ([place]). Good blood sugar control is important to decrease the likelihood of diabetic complications such as nephropathy, neuropathy, limb loss, blindness, coronary artery disease, and death. Intensive lifestyle modification including diet, exercise and weight loss are the first line of treatment for diabetes. Kandis will increase Semaglutide (RYBELSUS) to 14 MG TABS #30 with 0 refills.  At risk for hypoglycemia. Erin [name] was given approximately 15 minutes of counseling today regarding prevention of hypoglycemia. She was advised of symptoms of hypoglycemia. Erin [name] was instructed to avoid skipping meals, eat regular protein rich meals and schedule low calorie snacks as needed.   Repetitive spaced learning was employed today to elicit superior memory formation and behavioral change.  Class 3 severe obesity with serious comorbidity and body mass index (BMI) of 45.0 to 49.9 in adult, unspecified obesity type ([place]).  Erin [name] is currently in the action stage of change. As such, her goal is to continue with weight loss efforts. She has agreed to the Category 3 Plan.   Exercise goals: For substantial health benefits, adults should do at least 150 minutes (2 hours and 30 minutes) a week of moderate-intensity, or 75 minutes (1 hour and 15 minutes) a week of vigorous-intensity aerobic physical activity, or an equivalent combination of moderate- and vigorous-intensity aerobic activity. Aerobic activity should be performed in episodes of at least 10 minutes, and preferably, it should be spread  throughout the week.  Behavioral modification strategies: increasing lean protein intake and no skipping meals.  Erin [name] has agreed to follow-up with our clinic in 3 weeks. She was informed of the importance of frequent follow-up visits to maximize her success with intensive lifestyle modifications for her multiple health conditions.    Objective:   Blood pressure 115/68, pulse (!) 59, temperature 97.8 F (36.6 C), temperature source Oral, height 5\' 9"  (1.753 m), weight (!) 306 lb (138.8 kg), SpO2 100 %. Body mass index is 45.19 kg/m.  General: Cooperative, alert, well developed, in no acute distress. HEENT: Conjunctivae and lids unremarkable. Cardiovascular: Regular rhythm.  Lungs: Normal work of breathing. Neurologic: No focal deficits.   Lab Results  Component Value Date   CREATININE 0.55 (L) 05/30/2020   BUN 8 05/30/2020   NA 142 05/30/2020   K 4.2 05/30/2020   CL 102 05/30/2020   CO2 27 05/30/2020   Lab Results  Component Value Date   ALT 13 05/30/2020   AST 13 05/30/2020   ALKPHOS 86 05/30/2020   BILITOT 0.2 05/30/2020   Lab Results  Component Value Date   HGBA1C 7.2 (H) 05/30/2020   HGBA1C 7.5 12/09/2019   HGBA1C 7.4 (H) 08/10/2019   HGBA1C 8.0 (H) 04/14/2019   HGBA1C 8.6 (H) 07/16/2018   Lab Results  Component Value Date   INSULIN 17.3 05/30/2020   INSULIN 22.7 08/10/2019   INSULIN 16.3 04/14/2019   INSULIN 24.2 07/16/2018   Lab Results  Component Value Date   TSH 3.690 07/16/2018   Lab Results  Component Value Date   CHOL 133 05/30/2020   HDL 46 05/30/2020   LDLCALC 70 05/30/2020   TRIG 88 05/30/2020   CHOLHDL 2.9 05/30/2020   Lab Results  Component Value Date   WBC 7.4 12/09/2019   HGB 11.0 (A) 12/09/2019   HCT 35 (A) 12/09/2019   MCV 79 07/16/2018   PLT 339 12/09/2019   Lab Results  Component Value Date   IRON 38 (L) 12/20/2016   TIBC 244 12/20/2016   FERRITIN 590 (H) 12/20/2016   Attestation Statements:   Reviewed by clinician on day of visit: allergies, medications, problem list, medical history, surgical history, family history, social history, and previous encounter notes.  IMichaelene Song, am acting as transcriptionist for Abby Potash, PA-C   I have reviewed the above documentation for accuracy and completeness, and I agree with the above. Abby Potash, PA-C

## 2020-07-11 ENCOUNTER — Other Ambulatory Visit (INDEPENDENT_AMBULATORY_CARE_PROVIDER_SITE_OTHER): Payer: Self-pay | Admitting: Physician Assistant

## 2020-07-11 DIAGNOSIS — E559 Vitamin D deficiency, unspecified: Secondary | ICD-10-CM

## 2020-07-13 ENCOUNTER — Ambulatory Visit (INDEPENDENT_AMBULATORY_CARE_PROVIDER_SITE_OTHER): Payer: 59 | Admitting: Physician Assistant

## 2020-07-14 ENCOUNTER — Other Ambulatory Visit (INDEPENDENT_AMBULATORY_CARE_PROVIDER_SITE_OTHER): Payer: Self-pay | Admitting: Physician Assistant

## 2020-07-14 DIAGNOSIS — E1169 Type 2 diabetes mellitus with other specified complication: Secondary | ICD-10-CM

## 2020-07-17 NOTE — Telephone Encounter (Signed)
This patient was last seen by Abby Potash PA-C, and currently has an upcoming appt scheduled on 08/07/20. with her.

## 2020-07-17 NOTE — Telephone Encounter (Signed)
Would you like for me to refill rx?

## 2020-07-17 NOTE — Telephone Encounter (Signed)
Ok to send    thanks

## 2020-07-18 ENCOUNTER — Other Ambulatory Visit (INDEPENDENT_AMBULATORY_CARE_PROVIDER_SITE_OTHER): Payer: Self-pay

## 2020-07-18 DIAGNOSIS — E785 Hyperlipidemia, unspecified: Secondary | ICD-10-CM

## 2020-07-18 DIAGNOSIS — E1169 Type 2 diabetes mellitus with other specified complication: Secondary | ICD-10-CM

## 2020-07-18 MED ORDER — RYBELSUS 14 MG PO TABS: 14.0000 mg | ORAL_TABLET | Freq: Every day | ORAL | 0 refills | Status: DC

## 2020-07-19 ENCOUNTER — Other Ambulatory Visit: Payer: Self-pay | Admitting: Cardiovascular Disease

## 2020-08-01 ENCOUNTER — Encounter (INDEPENDENT_AMBULATORY_CARE_PROVIDER_SITE_OTHER): Payer: Self-pay | Admitting: Physician Assistant

## 2020-08-01 ENCOUNTER — Ambulatory Visit (INDEPENDENT_AMBULATORY_CARE_PROVIDER_SITE_OTHER): Payer: 59 | Admitting: Physician Assistant

## 2020-08-01 ENCOUNTER — Other Ambulatory Visit: Payer: Self-pay

## 2020-08-01 VITALS — BP 112/69 | HR 64 | Temp 97.9°F | Ht 69.0 in | Wt 304.0 lb

## 2020-08-01 DIAGNOSIS — E119 Type 2 diabetes mellitus without complications: Secondary | ICD-10-CM

## 2020-08-01 DIAGNOSIS — Z6841 Body Mass Index (BMI) 40.0 and over, adult: Secondary | ICD-10-CM | POA: Diagnosis not present

## 2020-08-01 DIAGNOSIS — I10 Essential (primary) hypertension: Secondary | ICD-10-CM | POA: Diagnosis not present

## 2020-08-01 DIAGNOSIS — Z9189 Other specified personal risk factors, not elsewhere classified: Secondary | ICD-10-CM

## 2020-08-01 DIAGNOSIS — E1159 Type 2 diabetes mellitus with other circulatory complications: Secondary | ICD-10-CM

## 2020-08-01 MED ORDER — RYBELSUS 14 MG PO TABS: 14.0000 mg | ORAL_TABLET | Freq: Every day | ORAL | 0 refills | Status: DC

## 2020-08-01 NOTE — Patient Instructions (Signed)
0

## 2020-08-02 NOTE — Progress Notes (Signed)
Chief Complaint:   Erin [name] is here to discuss her progress with her obesity treatment plan along with follow-up of her obesity related diagnoses. Erin [name] is on the Category 3 Plan and states she is following her eating plan approximately 50% of the time. Erin [name] states she is walking 7 times per week.  Today's visit was #: 15 Starting weight: 330 lbs Starting date: 07/16/2018 Today's weight: 304 lbs Today's date: 08/01/2020 Total lbs lost to date: 26 Total lbs lost since last in-office visit: 2  Interim History: Erin [name] reports that she has been working 7 days a week second shift. She gets up to take her granddaughter to school and is not getting adequate sleep. She sometimes skips breakfast.  Subjective:   Diabetes mellitus with coincident hypertension ([place]). Erin [name] is on metformin and Rybelsus.   Lab Results  Component Value Date   HGBA1C 7.2 (H) 05/30/2020   HGBA1C 7.5 12/09/2019   HGBA1C 7.4 (H) 08/10/2019   Lab Results  Component Value Date   MICROALBUR <0.7 12/09/2019   LDLCALC 70 05/30/2020   CREATININE 0.55 (L) 05/30/2020   Lab Results  Component Value Date   INSULIN 17.3 05/30/2020   INSULIN 22.7 08/10/2019   INSULIN 16.3 04/14/2019   INSULIN 24.2 07/16/2018   Essential hypertension. Erin [name] is on Cozaar and Lopressor. Blood pressure is controlled. No  chest pain or headache.   BP Readings from Last 3 Encounters:  08/01/20 112/69  06/21/20 115/68  06/13/20 123/78   Lab Results  Component Value Date   CREATININE 0.55 (L) 05/30/2020   CREATININE 0.6 12/09/2019   CREATININE 0.54 (L) 08/10/2019   At risk for heart disease. Erin [name] is at a higher than average risk for cardiovascular disease due to obesity.   Assessment/Plan:   Diabetes mellitus with coincident hypertension ([place]). Good blood sugar control is important to decrease the likelihood of diabetic complications such as nephropathy, neuropathy, limb loss, blindness, coronary artery  disease, and death. Intensive lifestyle modification including diet, exercise and weight loss are the first line of treatment for diabetes. Refill was given for Semaglutide (RYBELSUS) 14 MG TABS #30 with 0 refills.  Essential hypertension. Lashara is working on healthy weight loss and exercise to improve blood pressure control. We will watch for signs of hypotension as she continues her lifestyle modifications. She will continue her medications as directed.   At risk for heart disease. Erin [name] was given approximately 15 minutes of coronary artery disease prevention counseling today. She is 59 y.o. female and has risk factors for heart disease including obesity. We discussed intensive lifestyle modifications today with an emphasis on specific weight loss instructions and strategies.   Repetitive spaced learning was employed today to elicit superior memory formation and behavioral change.  Class 3 severe obesity with serious comorbidity and body mass index (BMI) of 45.0 to 49.9 in adult, unspecified obesity type ([place]).  Kamori is currently in the action stage of change. As such, her goal is to continue with weight loss efforts. She has agreed to the Category 3 Plan.   Exercise goals: For substantial health benefits, adults should do at least 150 minutes (2 hours and 30 minutes) a week of moderate-intensity, or 75 minutes (1 hour and 15 minutes) a week of vigorous-intensity aerobic physical activity, or an equivalent combination of moderate- and vigorous-intensity aerobic activity. Aerobic activity should be performed in episodes of at least 10 minutes, and preferably, it should be spread throughout the week.  Behavioral modification strategies:  meal planning and cooking strategies and keeping healthy foods in the home.  Erin [name] has agreed to follow-up with our clinic in 3 weeks. She was informed of the importance of frequent follow-up visits to maximize her success with intensive lifestyle modifications  for her multiple health conditions.   Objective:   Blood pressure 112/69, pulse 64, temperature 97.9 F (36.6 C), height 5\' 9"  (1.753 m), weight (!) 304 lb (137.9 kg), SpO2 100 %. Body mass index is 44.89 kg/m.  General: Cooperative, alert, well developed, in no acute distress. HEENT: Conjunctivae and lids unremarkable. Cardiovascular: Regular rhythm.  Lungs: Normal work of breathing. Neurologic: No focal deficits.   Lab Results  Component Value Date   CREATININE 0.55 (L) 05/30/2020   BUN 8 05/30/2020   NA 142 05/30/2020   K 4.2 05/30/2020   CL 102 05/30/2020   CO2 27 05/30/2020   Lab Results  Component Value Date   ALT 13 05/30/2020   AST 13 05/30/2020   ALKPHOS 86 05/30/2020   BILITOT 0.2 05/30/2020   Lab Results  Component Value Date   HGBA1C 7.2 (H) 05/30/2020   HGBA1C 7.5 12/09/2019   HGBA1C 7.4 (H) 08/10/2019   HGBA1C 8.0 (H) 04/14/2019   HGBA1C 8.6 (H) 07/16/2018   Lab Results  Component Value Date   INSULIN 17.3 05/30/2020   INSULIN 22.7 08/10/2019   INSULIN 16.3 04/14/2019   INSULIN 24.2 07/16/2018   Lab Results  Component Value Date   TSH 3.690 07/16/2018   Lab Results  Component Value Date   CHOL 133 05/30/2020   HDL 46 05/30/2020   LDLCALC 70 05/30/2020   TRIG 88 05/30/2020   CHOLHDL 2.9 05/30/2020   Lab Results  Component Value Date   WBC 7.4 12/09/2019   HGB 11.0 (A) 12/09/2019   HCT 35 (A) 12/09/2019   MCV 79 07/16/2018   PLT 339 12/09/2019   Lab Results  Component Value Date   IRON 38 (L) 12/20/2016   TIBC 244 12/20/2016   FERRITIN 590 (H) 12/20/2016   Attestation Statements:   Reviewed by clinician on day of visit: allergies, medications, problem list, medical history, surgical history, family history, social history, and previous encounter notes.  IMichaelene Song, am acting as transcriptionist for Abby Potash, PA-C   I have reviewed the above documentation for accuracy and completeness, and I agree with the above. Abby Potash, PA-C

## 2020-08-28 ENCOUNTER — Other Ambulatory Visit: Payer: Self-pay

## 2020-08-28 ENCOUNTER — Ambulatory Visit (INDEPENDENT_AMBULATORY_CARE_PROVIDER_SITE_OTHER): Payer: 59 | Admitting: Physician Assistant

## 2020-08-28 ENCOUNTER — Encounter (INDEPENDENT_AMBULATORY_CARE_PROVIDER_SITE_OTHER): Payer: Self-pay | Admitting: Physician Assistant

## 2020-08-28 VITALS — BP 133/71 | HR 77 | Temp 98.5°F | Ht 69.0 in | Wt 303.0 lb

## 2020-08-28 DIAGNOSIS — E559 Vitamin D deficiency, unspecified: Secondary | ICD-10-CM | POA: Diagnosis not present

## 2020-08-28 DIAGNOSIS — Z9189 Other specified personal risk factors, not elsewhere classified: Secondary | ICD-10-CM

## 2020-08-28 DIAGNOSIS — I10 Essential (primary) hypertension: Secondary | ICD-10-CM

## 2020-08-28 DIAGNOSIS — E119 Type 2 diabetes mellitus without complications: Secondary | ICD-10-CM | POA: Diagnosis not present

## 2020-08-28 DIAGNOSIS — Z6841 Body Mass Index (BMI) 40.0 and over, adult: Secondary | ICD-10-CM

## 2020-08-28 MED ORDER — VITAMIN D (ERGOCALCIFEROL) 1.25 MG (50000 UNIT) PO CAPS: 50000.0000 [IU] | ORAL_CAPSULE | ORAL | 0 refills | Status: DC

## 2020-08-28 MED ORDER — RYBELSUS 14 MG PO TABS: 14.0000 mg | ORAL_TABLET | Freq: Every day | ORAL | 0 refills | Status: DC

## 2020-08-28 NOTE — Progress Notes (Signed)
Chief Complaint:   Erin [name] is here to discuss her progress with her obesity treatment plan along with follow-up of her obesity related diagnoses. Erin [name] is on the Category 3 Plan and states she is following her eating plan approximately 50% of the time. Erin [name] states she is working/walking 12 hours 7 times per week.  Today's visit was #: 58 Starting weight: 330 lbs Starting date: 07/16/2018 Today's weight: 303 lbs Today's date: 08/28/2020 Total lbs lost to date: 27 Total lbs lost since last in-office visit: 1  Interim History: Erin [name] feels like she stays on plan for a day or 2 and then gets off plan. She is not getting all of her snack calories in.  Subjective:   Vitamin D deficiency. Erin [name] is on prescription Vitamin D supplementation. No nausea, vomiting, or muscle weakness. Last Vitamin D level was not at goal.   Ref. Range 05/30/2020 10:41  Vitamin D, 25-Hydroxy Latest Ref Range: 30.0 - 100.0 ng/mL 39.5   Diabetes mellitus with coincident hypertension ([place]) - Type 2 Diabetes. Erin [name] is on Rybelsus. Last A1c 7.2. Fasting blood sugars 98-138.  Lab Results  Component Value Date   HGBA1C 7.2 (H) 05/30/2020   HGBA1C 7.5 12/09/2019   HGBA1C 7.4 (H) 08/10/2019   Lab Results  Component Value Date   MICROALBUR <0.7 12/09/2019   LDLCALC 70 05/30/2020   CREATININE 0.55 (L) 05/30/2020   Lab Results  Component Value Date   INSULIN 17.3 05/30/2020   INSULIN 22.7 08/10/2019   INSULIN 16.3 04/14/2019   INSULIN 24.2 07/16/2018   At risk for hypoglycemia. Erin [name] is at increased risk for hypoglycemia due to changes in diet, diagnosis of diabetes, and/or insulin use.   Assessment/Plan:   Vitamin D deficiency. Low Vitamin D level contributes to fatigue and are associated with obesity, breast, and colon cancer. She agrees to continue to take prescription Vitamin D @50 ,000 IU every week #4 with 0 refills and will follow-up for routine testing of Vitamin D, at least  2-3 times per year to avoid over-replacement.  Diabetes mellitus with coincident hypertension ([place]) - Type 2 Diabetes . Good blood sugar control is important to decrease the likelihood of diabetic complications such as nephropathy, neuropathy, limb loss, blindness, coronary artery disease, and death. Intensive lifestyle modification including diet, exercise and weight loss are the first line of treatment for diabetes.  Refill was given for Rybelsus #30 with 0 refills.  At risk for hypoglycemia. Erin [name] was given approximately 15 minutes of counseling today regarding prevention of hypoglycemia. She was advised of symptoms of hypoglycemia. Erin [name] was instructed to avoid skipping meals, eat regular protein rich meals and schedule low calorie snacks as needed.   Repetitive spaced learning was employed today to elicit superior memory formation and behavioral change.   Class 3 severe obesity with serious comorbidity and body mass index (BMI) of 40.0 to 44.9 in adult, unspecified obesity type ([place]).  Erin [name] is currently in the action stage of change. As such, her goal is to continue with weight loss efforts. She has agreed to the Category 3 Plan.   Labs will be checked at her next visit.  Exercise goals: For substantial health benefits, adults should do at least 150 minutes (2 hours and 30 minutes) a week of moderate-intensity, or 75 minutes (1 hour and 15 minutes) a week of vigorous-intensity aerobic physical activity, or an equivalent combination of moderate- and vigorous-intensity aerobic activity. Aerobic activity should be performed in episodes of at least 10  minutes, and preferably, it should be spread throughout the week.  Behavioral modification strategies: no skipping meals and meal planning and cooking strategies.  Erin [name] has agreed to follow-up with our clinic in 3 weeks. She was informed of the importance of frequent follow-up visits to maximize her success with intensive lifestyle  modifications for her multiple health conditions.   Objective:   Blood pressure 133/71, pulse 77, temperature 98.5 F (36.9 C), height 5\' 9"  (1.753 m), weight (!) 303 lb (137.4 kg), SpO2 100 %. Body mass index is 44.75 kg/m.  General: Cooperative, alert, well developed, in no acute distress. HEENT: Conjunctivae and lids unremarkable. Cardiovascular: Regular rhythm.  Lungs: Normal work of breathing. Neurologic: No focal deficits.   Lab Results  Component Value Date   CREATININE 0.55 (L) 05/30/2020   BUN 8 05/30/2020   NA 142 05/30/2020   K 4.2 05/30/2020   CL 102 05/30/2020   CO2 27 05/30/2020   Lab Results  Component Value Date   ALT 13 05/30/2020   AST 13 05/30/2020   ALKPHOS 86 05/30/2020   BILITOT 0.2 05/30/2020   Lab Results  Component Value Date   HGBA1C 7.2 (H) 05/30/2020   HGBA1C 7.5 12/09/2019   HGBA1C 7.4 (H) 08/10/2019   HGBA1C 8.0 (H) 04/14/2019   HGBA1C 8.6 (H) 07/16/2018   Lab Results  Component Value Date   INSULIN 17.3 05/30/2020   INSULIN 22.7 08/10/2019   INSULIN 16.3 04/14/2019   INSULIN 24.2 07/16/2018   Lab Results  Component Value Date   TSH 3.690 07/16/2018   Lab Results  Component Value Date   CHOL 133 05/30/2020   HDL 46 05/30/2020   LDLCALC 70 05/30/2020   TRIG 88 05/30/2020   CHOLHDL 2.9 05/30/2020   Lab Results  Component Value Date   WBC 7.4 12/09/2019   HGB 11.0 (A) 12/09/2019   HCT 35 (A) 12/09/2019   MCV 79 07/16/2018   PLT 339 12/09/2019   Lab Results  Component Value Date   IRON 38 (L) 12/20/2016   TIBC 244 12/20/2016   FERRITIN 590 (H) 12/20/2016   Attestation Statements:   Reviewed by clinician on day of visit: allergies, medications, problem list, medical history, surgical history, family history, social history, and previous encounter notes.  Erin [name], am acting as transcriptionist for Abby Potash, PA-C   I have reviewed the above documentation for accuracy and completeness, and I agree with  the above. Abby Potash, PA-C

## 2020-08-30 ENCOUNTER — Ambulatory Visit (INDEPENDENT_AMBULATORY_CARE_PROVIDER_SITE_OTHER): Payer: Worker's Compensation | Admitting: Orthopaedic Surgery

## 2020-08-30 ENCOUNTER — Encounter: Payer: Self-pay | Admitting: Orthopaedic Surgery

## 2020-08-30 ENCOUNTER — Ambulatory Visit: Payer: Self-pay

## 2020-08-30 VITALS — BP 132/82 | HR 72 | Ht 69.0 in | Wt 303.0 lb

## 2020-08-30 DIAGNOSIS — M545 Low back pain, unspecified: Secondary | ICD-10-CM

## 2020-08-30 DIAGNOSIS — G8929 Other chronic pain: Secondary | ICD-10-CM

## 2020-08-30 NOTE — Progress Notes (Signed)
Office Visit Note   Patient: Erin [name]           Date of Birth: 12/11/1960           MRN: [id number] Visit Date: 08/30/2020              Requested by: Glenis Smoker, MD [place],  [place] [id number] PCP: Glenis Smoker, MD   Assessment & Plan: Visit Diagnoses:  1. Chronic bilateral low back pain, unspecified whether sciatica present     Plan: Patient #1 obesity is continue to try to work on weight loss which will help her back pain symptoms.  She has some diabetes A1c was less than 7.5.  She is having increased problems with her knees and is trying to make it through next year so she can consider retirement.  Previous MRI showed lumbar disc protrusion on the left side at L2-3.  Right leg is the one this been giving her buckling problems.  Follow-Up Instructions: Return in about 3 months (around 11/28/2020).   Orders:  Orders Placed This Encounter  Procedures  . XR Lumbar Spine 2-3 Views   No orders of the defined types were placed in this encounter.     Procedures: No procedures performed   Clinical Data: No additional findings.   Subjective: Chief Complaint  Patient presents with  . Lower Back - Pain    HPI 59 year old female seen with problems with ongoing back pain problems.  Patient states her right leg seems like is been giving way been buckling.  Onset was 2 weeks ago has been worse in the last week with burning and tingling in the right leg.  Past history of on-the-job injuries to her back 2016 and also 2019.  She does have hypertension diabetes and morbid obesity, BMI 44.  Patient is used Tylenol extra strength without relief.  Review of Systems all other systems are negative to HPI.  No fever chills no bowel bladder associated symptoms.   Objective: Vital Signs: BP 132/82   Pulse 72   Ht 5\' 9"  (1.753 m)   Wt (!) 303 lb (137.4 kg)   BMI 44.75 kg/m   Physical Exam Constitutional:      Appearance: She is  well-developed.  HENT:     Head: Normocephalic.     Right Ear: External ear normal.     Left Ear: External ear normal.  Eyes:     Pupils: Pupils are equal, round, and reactive to light.  Neck:     Thyroid: No thyromegaly.     Trachea: No tracheal deviation.  Cardiovascular:     Rate and Rhythm: Normal rate.  Pulmonary:     Effort: Pulmonary effort is normal.  Abdominal:     Palpations: Abdomen is soft.  Skin:    General: Skin is warm and dry.  Neurological:     Mental Status: She is alert and oriented to person, place, and time.  Psychiatric:        Mood and Affect: Mood and affect normal.        Behavior: Behavior normal.     Ortho Exam negative logroll of the hips knee and ankle jerk are intact anterior tib EHL is intact.  Distal pulses are intact.  Specialty Comments:  No specialty comments available.  Imaging: No results found.   PMFS History: Patient Active Problem List   Diagnosis Date Noted  . Protrusion of lumbar intervertebral disc 01/12/2020  . Low back pain  05/11/2019  . Morbid (severe) obesity due to excess calories ([place]) 11/25/2016  . Shortness of breath 06/13/2016  . Atypical chest pain 06/13/2016  . Anemia of chronic disease 03/08/2015  . High blood pressure 07/22/2012  . Diabetes mellitus ([place]) 07/22/2012   Past Medical History:  Diagnosis Date  . Anemia   . Atypical chest pain 06/13/2016  . Back pain   . Breast nodule 2009   Right   . Diabetes mellitus   . Fibroid    Symptomatic  . H/O dysmenorrhea   . H/O hematuria   . H/O hypercholesterolemia   . H/O: menorrhagia   . H/O: obesity   . HPV in female   . Hypertension   . Microhematuria 2012  . Shortness of breath 06/13/2016  . Yeast vaginitis 2008    Family History  Problem Relation Age of Onset  . Diabetes Mother   . Valvular heart disease Mother   . Diabetes Brother   . Hypertension Father   . Stroke Father     Past Surgical History:  Procedure Laterality Date  . ABDOMINAL  HYSTERECTOMY    . HYSTEROSCOPY WITH D & C  2003  . POLYPECTOMY  2008  . TUBAL LIGATION  1984   Bilateral   Social History   Occupational History  . Occupation: Glass blower/designer  Tobacco Use  . Smoking status: Never Smoker  . Smokeless tobacco: Never Used  Vaping Use  . Vaping Use: Never used  Substance and Sexual Activity  . Alcohol use: No  . Drug use: No  . Sexual activity: Never    Comment: hysterectomy

## 2020-09-19 ENCOUNTER — Other Ambulatory Visit: Payer: Self-pay

## 2020-09-19 ENCOUNTER — Encounter (INDEPENDENT_AMBULATORY_CARE_PROVIDER_SITE_OTHER): Payer: Self-pay | Admitting: Physician Assistant

## 2020-09-19 ENCOUNTER — Ambulatory Visit (INDEPENDENT_AMBULATORY_CARE_PROVIDER_SITE_OTHER): Payer: 59 | Admitting: Physician Assistant

## 2020-09-19 VITALS — BP 136/78 | HR 62 | Temp 98.0°F | Ht 69.0 in | Wt 296.0 lb

## 2020-09-19 DIAGNOSIS — E559 Vitamin D deficiency, unspecified: Secondary | ICD-10-CM | POA: Diagnosis not present

## 2020-09-19 DIAGNOSIS — E119 Type 2 diabetes mellitus without complications: Secondary | ICD-10-CM | POA: Diagnosis not present

## 2020-09-19 DIAGNOSIS — E7849 Other hyperlipidemia: Secondary | ICD-10-CM

## 2020-09-19 DIAGNOSIS — Z6841 Body Mass Index (BMI) 40.0 and over, adult: Secondary | ICD-10-CM

## 2020-09-19 DIAGNOSIS — Z9189 Other specified personal risk factors, not elsewhere classified: Secondary | ICD-10-CM

## 2020-09-19 DIAGNOSIS — I10 Essential (primary) hypertension: Secondary | ICD-10-CM

## 2020-09-19 MED ORDER — VITAMIN D (ERGOCALCIFEROL) 1.25 MG (50000 UNIT) PO CAPS: 50000.0000 [IU] | ORAL_CAPSULE | ORAL | 0 refills | Status: DC

## 2020-09-20 LAB — COMPREHENSIVE METABOLIC PANEL
ALT: 15 IU/L (ref 0–32)
AST: 18 IU/L (ref 0–40)
Albumin/Globulin Ratio: 1.7 (ref 1.2–2.2)
Albumin: 4 g/dL (ref 3.8–4.9)
Alkaline Phosphatase: 64 IU/L (ref 44–121)
BUN/Creatinine Ratio: 17 (ref 9–23)
BUN: 9 mg/dL (ref 6–24)
Bilirubin Total: 0.3 mg/dL (ref 0.0–1.2)
CO2: 25 mmol/L (ref 20–29)
Calcium: 9.2 mg/dL (ref 8.7–10.2)
Chloride: 105 mmol/L (ref 96–106)
Creatinine, Ser: 0.52 mg/dL — ABNORMAL LOW (ref 0.57–1.00)
GFR calc Af Amer: 121 mL/min/{1.73_m2} (ref >59)
GFR calc non Af Amer: 105 mL/min/{1.73_m2} (ref >59)
Globulin, Total: 2.3 g/dL (ref 1.5–4.5)
Glucose: 96 mg/dL (ref 65–99)
Potassium: 3.8 mmol/L (ref 3.5–5.2)
Sodium: 143 mmol/L (ref 134–144)
Total Protein: 6.3 g/dL (ref 6.0–8.5)

## 2020-09-20 LAB — HEMOGLOBIN A1C
Est. average glucose Bld gHb Est-mCnc: 157 mg/dL
Hgb A1c MFr Bld: 7.1 % — ABNORMAL HIGH (ref 4.8–5.6)

## 2020-09-20 LAB — VITAMIN D 25 HYDROXY (VIT D DEFICIENCY, FRACTURES): Vit D, 25-Hydroxy: 46.6 ng/mL (ref 30.0–100.0)

## 2020-09-20 LAB — LIPID PANEL
Chol/HDL Ratio: 2.4 ratio (ref 0.0–4.4)
Cholesterol, Total: 126 mg/dL (ref 100–199)
HDL: 53 mg/dL (ref >39)
LDL Chol Calc (NIH): 56 mg/dL (ref 0–99)
Triglycerides: 86 mg/dL (ref 0–149)
VLDL Cholesterol Cal: 17 mg/dL (ref 5–40)

## 2020-09-20 LAB — INSULIN, RANDOM: INSULIN: 7.6 u[IU]/mL (ref 2.6–24.9)

## 2020-09-20 NOTE — Progress Notes (Unsigned)
Chief Complaint:   OBESITY Erin [name] is here to discuss her progress with her obesity treatment plan along with follow-up of her obesity related diagnoses. Erin [name] is on the Category 3 Plan and states she is following her eating plan approximately 60% of the time. Erin [name] states she is walking 12 hours 7 times per week.   Today's visit was #: 84 Starting weight: 330 lbs Starting date: 07/16/2018 Today's weight: 296 lbs Today's date: 09/19/2020 Total lbs lost to date: 34 Total lbs lost since last in-office visit: 7  Interim History: Erin [name] is recovering from [place] and she is currently feeling better. Her appetite was down when she was sick, but it is now back to normal. She is eating all of the food on the plan.  Subjective:   1. Diabetes mellitus with coincident hypertension ([place]) Erin [name] is on Rybelsus and metformin, and she is tolerating both well. She denies polyphagia or hypoglycemia. She is due for labs today.  2. Other hyperlipidemia Erin [name] is on Crestor, and she denies chest pain or myalgias. She is due for labs today.  3. Vitamin D deficiency Erin [name] is on Vit D weekly, and she denies nausea, vomiting, or muscle weakness. She is due for labs today.  4. At risk for heart disease Erin [name] is at a higher than average risk for cardiovascular disease due to obesity.   Assessment/Plan:   1. Diabetes mellitus with coincident hypertension ([place]) Good blood sugar control is important to decrease the likelihood of diabetic complications such as nephropathy, neuropathy, limb loss, blindness, coronary artery disease, and death. Intensive lifestyle modification including diet, exercise and weight loss are the first line of treatment for diabetes. We will check labs today. Erin [name] will continue her medications, and will follow up as directed.  - Comprehensive metabolic panel - Hemoglobin A1c - Insulin, random  2. Other hyperlipidemia Cardiovascular risk and specific lipid/LDL goals  reviewed. We discussed several lifestyle modifications today. We will check labs today. Erin [name] will continue to work on diet, exercise and weight loss efforts. Orders and follow up as documented in patient record.   Counseling Intensive lifestyle modifications are the first line treatment for this issue. . Dietary changes: Increase soluble fiber. Decrease simple carbohydrates. . Exercise changes: Moderate to vigorous-intensity aerobic activity 150 minutes per week if tolerated. . Lipid-lowering medications: see documented in medical record.  - Lipid panel  3. Vitamin D deficiency Low Vitamin D level contributes to fatigue and are associated with obesity, breast, and colon cancer. We will check labs today, and we will refill prescription Vitamin D for 1 month. Erin [name] will follow-up for routine testing of Vitamin D, at least 2-3 times per year to avoid over-replacement.  - VITAMIN D 25 Hydroxy (Vit-D Deficiency, Fractures) - Vitamin D, Ergocalciferol, (DRISDOL) 1.25 MG (50000 UNIT) CAPS capsule; Take 1 capsule (50,000 Units total) by mouth every 7 (seven) days.  Dispense: 4 capsule; Refill: 0  4. At risk for heart disease Erin [name] was given approximately 15 minutes of coronary artery disease prevention counseling today. She is 60 y.o. female and has risk factors for heart disease including obesity. We discussed intensive lifestyle modifications today with an emphasis on specific weight loss instructions and strategies.   Repetitive spaced learning was employed today to elicit superior memory formation and behavioral change.  5. Class 3 severe obesity with serious comorbidity and body mass index (BMI) of 40.0 to 44.9 in adult, unspecified obesity type Erin [name]) Erin [name] is currently in the action stage of change. As  such, her goal is to continue with weight loss efforts. She has agreed to the Category 3 Plan.   Exercise goals: As is.  Behavioral modification strategies: no skipping meals and meal  planning and cooking strategies.  Erin [name] has agreed to follow-up with our clinic in 3 weeks. She was informed of the importance of frequent follow-up visits to maximize her success with intensive lifestyle modifications for her multiple health conditions.   Erin [name] was informed we would discuss her lab results at her next visit unless there is a critical issue that needs to be addressed sooner. Erin [name] agreed to keep her next visit at the agreed upon time to discuss these results.  Objective:   Blood pressure 136/78, pulse 62, temperature 98 F (36.7 C), height 5\' 9"  (1.753 m), weight 296 lb (134.3 kg), SpO2 100 %. Body mass index is 43.71 kg/m.  General: Cooperative, alert, well developed, in no acute distress. HEENT: Conjunctivae and lids unremarkable. Cardiovascular: Regular rhythm.  Lungs: Normal work of breathing. Neurologic: No focal deficits.   Lab Results  Component Value Date   CREATININE 0.52 (L) 09/19/2020   BUN 9 09/19/2020   NA 143 09/19/2020   K 3.8 09/19/2020   CL 105 09/19/2020   CO2 25 09/19/2020   Lab Results  Component Value Date   ALT 15 09/19/2020   AST 18 09/19/2020   ALKPHOS 64 09/19/2020   BILITOT 0.3 09/19/2020   Lab Results  Component Value Date   HGBA1C 7.1 (H) 09/19/2020   HGBA1C 7.2 (H) 05/30/2020   HGBA1C 7.5 12/09/2019   HGBA1C 7.4 (H) 08/10/2019   HGBA1C 8.0 (H) 04/14/2019   Lab Results  Component Value Date   INSULIN 7.6 09/19/2020   INSULIN 17.3 05/30/2020   INSULIN 22.7 08/10/2019   INSULIN 16.3 04/14/2019   INSULIN 24.2 07/16/2018   Lab Results  Component Value Date   TSH 3.690 07/16/2018   Lab Results  Component Value Date   CHOL 126 09/19/2020   HDL 53 09/19/2020   LDLCALC 56 09/19/2020   TRIG 86 09/19/2020   CHOLHDL 2.4 09/19/2020   Lab Results  Component Value Date   WBC 7.4 12/09/2019   HGB 11.0 (A) 12/09/2019   HCT 35 (A) 12/09/2019   MCV 79 07/16/2018   PLT 339 12/09/2019   Lab Results  Component Value  Date   IRON 38 (L) 12/20/2016   TIBC 244 12/20/2016   FERRITIN 590 (H) 12/20/2016   Attestation Statements:   Reviewed by clinician on day of visit: allergies, medications, problem list, medical history, surgical history, family history, social history, and previous encounter notes.   Wilhemena Durie, am acting as transcriptionist for [employer], PA-C.  I have reviewed the above documentation for accuracy and completeness, and I agree with the above. Abby Potash, PA-C

## 2020-10-06 IMAGING — CT CT HEART MORP W/ CTA COR W/ SCORE W/ CA W/CM &/OR W/O CM
4 of 7 series · 8 of 20 positions shown, 9 images · IV contrast (APPLIED)
Comparison: None.

Addendum:
EXAM:
OVER-READ INTERPRETATION  CT CHEST

The following report is an over-read performed by radiologist Dr.
Nomasibulele Moatshe [REDACTED] on 07/28/2018. This
over-read does not include interpretation of cardiac or coronary
anatomy or pathology. The coronary CTA interpretation by the
cardiologist is attached.
CLINICAL DATA: Chest pain
Cardiac CTA
MEDICATIONS:
Sub lingual nitro. 4mg x 2
TECHNIQUE: The patient was scanned on a Siemens [REDACTED]ice scanner. Gantry
rotation speed was 250 msecs. Collimation was 0.6 mm. A 100 kV
prospective scan was triggered in the ascending thoracic aorta at
35-75% of the R-R interval. Average HR during the scan was 60 bpm.
The 3D data set was interpreted on a dedicated work station using
MPR, MIP and VRT modes. A total of 80cc of contrast was used.

[Series 6: best diast 72 % · axial · 0.40mm/px · z∈[-336,-293]mm · 2 of 326 slices shown, 3 images]
[im 109/326  vessel]
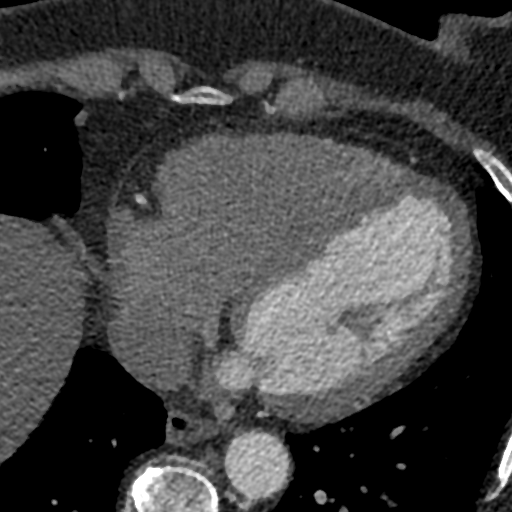
[im 109/326  lung]
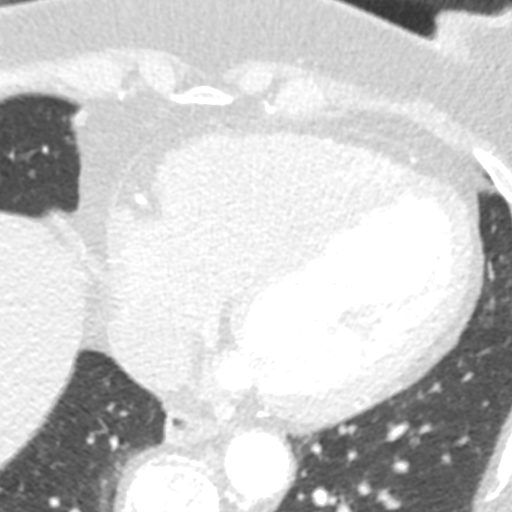
[im 217/326  vessel]
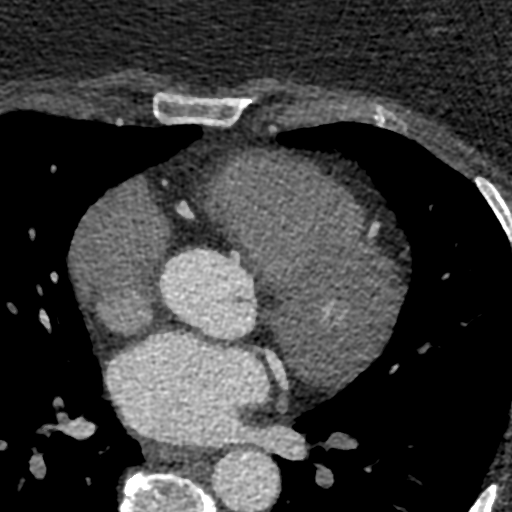

[Series 7: best syst 39 % · axial · 0.40mm/px · z∈[-336,-293]mm · 2 of 326 slices shown]
[im 109/326  vessel]
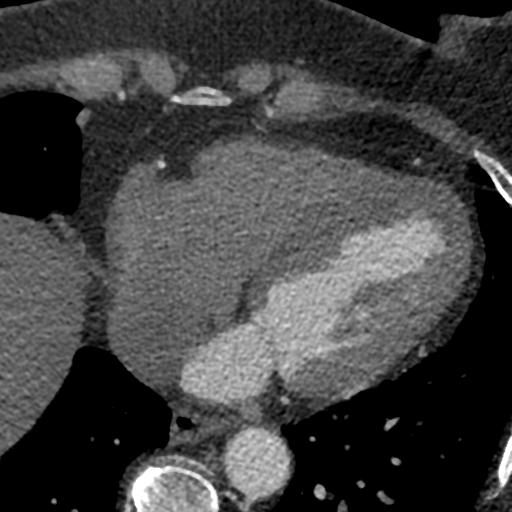
[im 217/326  vessel]
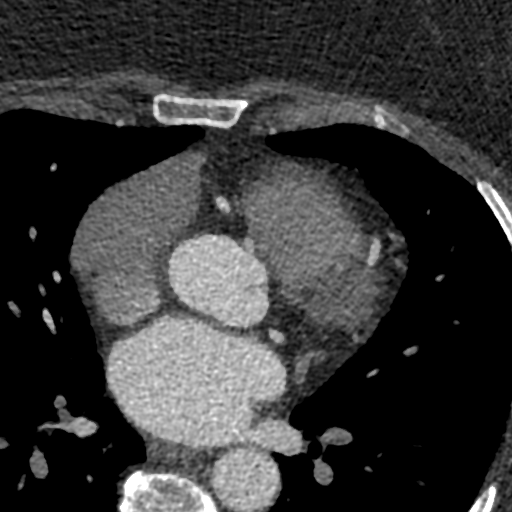

[Series 8: ts diast sharp 72 % · axial · 0.40mm/px · z∈[-336,-293]mm · 2 of 326 slices shown]
[im 109/326  lung]
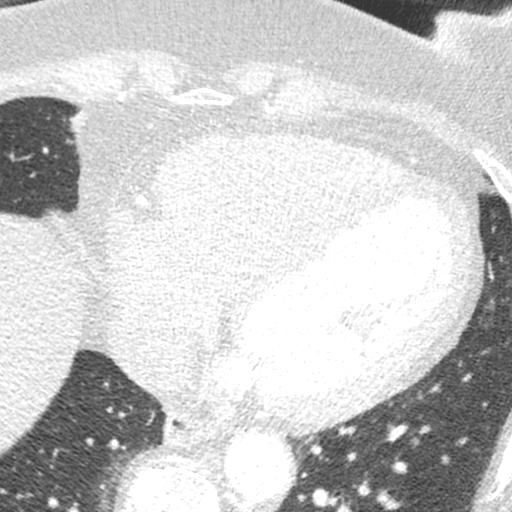
[im 217/326  lung]
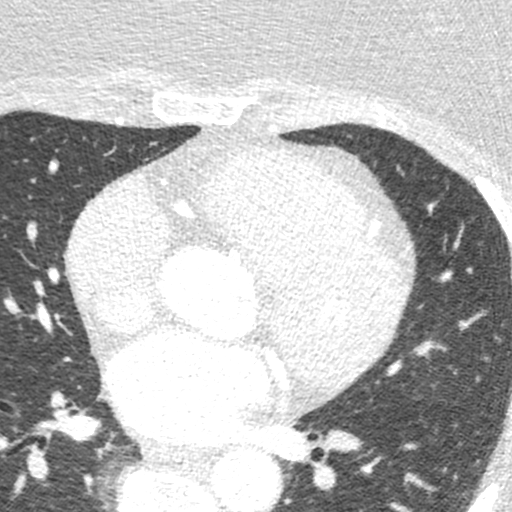

[Series 9: ts syst sharp 39 % · axial · 0.40mm/px · z∈[-336,-293]mm · 2 of 326 slices shown]
[im 109/326  lung]
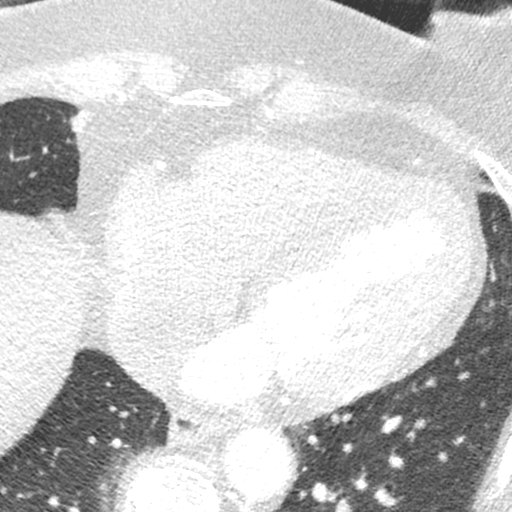
[im 217/326  lung]
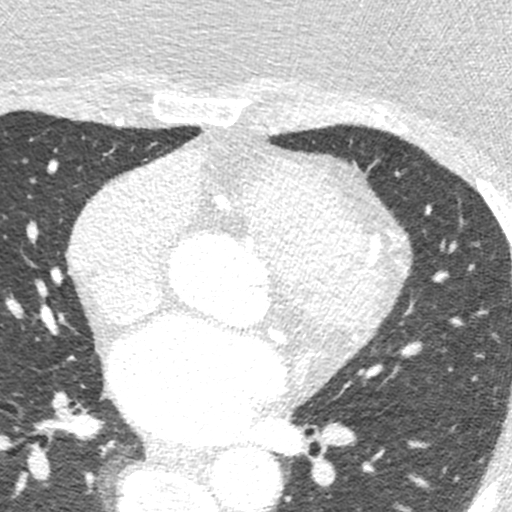

[8 of 20 positions shown; findings below may reference images not displayed]

FINDINGS: Vascular: Heart is normal size.  Aorta is normal caliber.

Mediastinum/Nodes: No adenopathy in the lower mediastinum or hila.
Small hiatal hernia.

Lungs/Pleura: Visualized lungs clear.  No effusions.

Upper Abdomen: Imaging into the upper abdomen shows no acute
findings.

Musculoskeletal: Chest wall soft tissues are unremarkable. No acute
bony abnormality.
IMPRESSION: Small hiatal hernia.  No acute extra cardiac abnormality.
FINDINGS: Non-cardiac: See separate report from [REDACTED].

Pulmonary veins drain normally to the left atrium.

Calcium Score: 0 Agatston units.

Coronary Arteries: Right dominant with no anomalies

LM: No plaque or stenosis.

LAD system:  No plaque or stenosis.

Circumflex system: No plaque or stenosis.

RCA system: No plaque or stenosis.
IMPRESSION: 1. Coronary artery calcium score 0 Agatston units, suggesting low
risk for future cardiac events.

2.  No significant coronary disease noted.

[employer]

*** End of Addendum ***

## 2020-10-09 ENCOUNTER — Ambulatory Visit (INDEPENDENT_AMBULATORY_CARE_PROVIDER_SITE_OTHER): Payer: 59 | Admitting: Physician Assistant

## 2020-10-19 ENCOUNTER — Ambulatory Visit (INDEPENDENT_AMBULATORY_CARE_PROVIDER_SITE_OTHER): Payer: 59 | Admitting: Physician Assistant

## 2020-10-19 ENCOUNTER — Other Ambulatory Visit: Payer: Self-pay

## 2020-10-19 ENCOUNTER — Encounter (INDEPENDENT_AMBULATORY_CARE_PROVIDER_SITE_OTHER): Payer: Self-pay | Admitting: Physician Assistant

## 2020-10-19 VITALS — BP 131/78 | HR 70 | Temp 97.9°F | Ht 69.0 in | Wt 293.0 lb

## 2020-10-19 DIAGNOSIS — Z9189 Other specified personal risk factors, not elsewhere classified: Secondary | ICD-10-CM

## 2020-10-19 DIAGNOSIS — E119 Type 2 diabetes mellitus without complications: Secondary | ICD-10-CM

## 2020-10-19 DIAGNOSIS — E559 Vitamin D deficiency, unspecified: Secondary | ICD-10-CM | POA: Diagnosis not present

## 2020-10-19 DIAGNOSIS — I10 Essential (primary) hypertension: Secondary | ICD-10-CM | POA: Diagnosis not present

## 2020-10-19 DIAGNOSIS — Z6841 Body Mass Index (BMI) 40.0 and over, adult: Secondary | ICD-10-CM

## 2020-10-19 MED ORDER — RYBELSUS 14 MG PO TABS: 14.0000 mg | ORAL_TABLET | Freq: Every day | ORAL | 0 refills | Status: DC

## 2020-10-19 MED ORDER — VITAMIN D (ERGOCALCIFEROL) 1.25 MG (50000 UNIT) PO CAPS: 50000.0000 [IU] | ORAL_CAPSULE | ORAL | 0 refills | Status: DC

## 2020-10-19 NOTE — Progress Notes (Signed)
Chief Complaint:   OBESITY Erin [name] is here to discuss her progress with her obesity treatment plan along with follow-up of her obesity related diagnoses. Erin [name] is on the Category 3 Plan and states she is following her eating plan approximately 40% of the time. Erin [name] states she is walking for 12 hours 6 times per week.  Today's visit was #: 31 Starting weight: 330 lbs Starting date: 07/16/2018 Today's weight: 293 lbs Today's date: 10/19/2020 Total lbs lost to date: 37 Total lbs lost since last in-office visit: 3  Interim History: Erin [name] reports that her taste and smell has not returned since having COVID. She is on the plan for 3-4 days then falls off for a couple of days.  Subjective:   1. Vitamin D deficiency Erin [name]'s last Vit D was 46.6, which improved from 46.6. She is on Vit D weekly. I discussed labs with the patient today.  2. Diabetes mellitus with coincident hypertension ([place]) Erin [name]'s last A1c was 7.1, which has improved from 7.2. She is on metformin 1,000 mg BID and Rybelsus 14 mg. I discussed labs with the patient today.  3. At risk for hypoglycemia Erin [name] is at increased risk for hypoglycemia due to changes in diet, diagnosis of diabetes, and/or insulin use.   Assessment/Plan:   1. Vitamin D deficiency Low Vitamin D level contributes to fatigue and are associated with obesity, breast, and colon cancer. We will refill prescription Vitamin D for 1 month. Erin [name] will follow-up for routine testing of Vitamin D, at least 2-3 times per year to avoid over-replacement.  - Vitamin D, Ergocalciferol, (DRISDOL) 1.25 MG (50000 UNIT) CAPS capsule; Take 1 capsule (50,000 Units total) by mouth every 7 (seven) days.  Dispense: 4 capsule; Refill: 0  2. Diabetes mellitus with coincident hypertension ([place]) Good blood sugar control is important to decrease the likelihood of diabetic complications such as nephropathy, neuropathy, limb loss, blindness, coronary artery disease, and  death. Intensive lifestyle modification including diet, exercise and weight loss are the first line of treatment for diabetes. We will refill Rybelsus for 1 month.  - Semaglutide (RYBELSUS) 14 MG TABS; Take 14 mg by mouth daily.  Dispense: 30 tablet; Refill: 0  3. At risk for hypoglycemia Erin [name] was given approximately 15 minutes of counseling today regarding prevention of hypoglycemia. She was advised of symptoms of hypoglycemia. Erin [name] was instructed to avoid skipping meals, eat regular protein rich meals and schedule low calorie snacks as needed.   Repetitive spaced learning was employed today to elicit superior memory formation and behavioral change  4. Class 3 severe obesity with serious comorbidity and body mass index (BMI) of 45.0 to 49.9 in adult, unspecified obesity type Erin [name]) Erin [name] is currently in the action stage of change. As such, her goal is to continue with weight loss efforts. She has agreed to the Category 3 Plan.   Exercise goals: As is.  Behavioral modification strategies: decreasing eating out and meal planning and cooking strategies.  Erin [name] has agreed to follow-up with our clinic in 3 weeks. She was informed of the importance of frequent follow-up visits to maximize her success with intensive lifestyle modifications for her multiple health conditions.   Objective:   Blood pressure 131/78, pulse 70, temperature 97.9 F (36.6 C), height 5\' 9"  (1.753 m), weight 293 lb (132.9 kg), SpO2 100 %. Body mass index is 43.27 kg/m.  General: Cooperative, alert, well developed, in no acute distress. HEENT: Conjunctivae and lids unremarkable. Cardiovascular: Regular rhythm.  Lungs: Normal work of  breathing. Neurologic: No focal deficits.   Lab Results  Component Value Date   CREATININE 0.52 (L) 09/19/2020   BUN 9 09/19/2020   NA 143 09/19/2020   K 3.8 09/19/2020   CL 105 09/19/2020   CO2 25 09/19/2020   Lab Results  Component Value Date   ALT 15 09/19/2020   AST 18  09/19/2020   ALKPHOS 64 09/19/2020   BILITOT 0.3 09/19/2020   Lab Results  Component Value Date   HGBA1C 7.1 (H) 09/19/2020   HGBA1C 7.2 (H) 05/30/2020   HGBA1C 7.5 12/09/2019   HGBA1C 7.4 (H) 08/10/2019   HGBA1C 8.0 (H) 04/14/2019   Lab Results  Component Value Date   INSULIN 7.6 09/19/2020   INSULIN 17.3 05/30/2020   INSULIN 22.7 08/10/2019   INSULIN 16.3 04/14/2019   INSULIN 24.2 07/16/2018   Lab Results  Component Value Date   TSH 3.690 07/16/2018   Lab Results  Component Value Date   CHOL 126 09/19/2020   HDL 53 09/19/2020   LDLCALC 56 09/19/2020   TRIG 86 09/19/2020   CHOLHDL 2.4 09/19/2020   Lab Results  Component Value Date   WBC 7.4 12/09/2019   HGB 11.0 (A) 12/09/2019   HCT 35 (A) 12/09/2019   MCV 79 07/16/2018   PLT 339 12/09/2019   Lab Results  Component Value Date   IRON 38 (L) 12/20/2016   TIBC 244 12/20/2016   FERRITIN 590 (H) 12/20/2016   Attestation Statements:   Reviewed by clinician on day of visit: allergies, medications, problem list, medical history, surgical history, family history, social history, and previous encounter notes.   Wilhemena Durie, am acting as transcriptionist for [employer], PA-C.  I have reviewed the above documentation for accuracy and completeness, and I agree with the above. Abby Potash, PA-C

## 2020-11-09 ENCOUNTER — Encounter (INDEPENDENT_AMBULATORY_CARE_PROVIDER_SITE_OTHER): Payer: Self-pay | Admitting: Physician Assistant

## 2020-11-09 ENCOUNTER — Other Ambulatory Visit: Payer: Self-pay

## 2020-11-09 ENCOUNTER — Ambulatory Visit (INDEPENDENT_AMBULATORY_CARE_PROVIDER_SITE_OTHER): Payer: 59 | Admitting: Physician Assistant

## 2020-11-09 VITALS — BP 111/68 | HR 63 | Temp 98.4°F | Ht 69.0 in | Wt 292.0 lb

## 2020-11-09 DIAGNOSIS — E119 Type 2 diabetes mellitus without complications: Secondary | ICD-10-CM

## 2020-11-09 DIAGNOSIS — Z9189 Other specified personal risk factors, not elsewhere classified: Secondary | ICD-10-CM | POA: Diagnosis not present

## 2020-11-09 DIAGNOSIS — I10 Essential (primary) hypertension: Secondary | ICD-10-CM

## 2020-11-09 DIAGNOSIS — Z6841 Body Mass Index (BMI) 40.0 and over, adult: Secondary | ICD-10-CM

## 2020-11-09 DIAGNOSIS — E559 Vitamin D deficiency, unspecified: Secondary | ICD-10-CM

## 2020-11-09 MED ORDER — RYBELSUS 14 MG PO TABS: 14.0000 mg | ORAL_TABLET | Freq: Every day | ORAL | 0 refills | Status: DC

## 2020-11-09 MED ORDER — VITAMIN D (ERGOCALCIFEROL) 1.25 MG (50000 UNIT) PO CAPS: 50000.0000 [IU] | ORAL_CAPSULE | ORAL | 0 refills | Status: DC

## 2020-11-13 NOTE — Progress Notes (Signed)
Chief Complaint:   OBESITY Erin [name] is here to discuss her progress with her obesity treatment plan along with follow-up of her obesity related diagnoses. Erin [name] is on the Category 3 Plan and states she is following her eating plan approximately 50% of the time. Erin [name] states she is doing 0 minutes 0 times per week.  Today's visit was #: 6 Starting weight: 330 lbs Starting date: 07/16/2018 Today's weight: 292 lbs Today's date: 11/09/2020 Total lbs lost to date: 38 Total lbs lost since last in-office visit: 1  Interim History: Erin [name] has been working 7 days a week, and she has not been meal prepping or planning. She is eating a microwave meal for dinner. Her RMR may decrease if she doesn't eat enough protein.  Subjective:   1. Diabetes mellitus with coincident hypertension ([place]) Erin [name] is on Rybelsus 14 mg daily. Last A1c was 7.1. She denies polyphagia.  2. Vitamin D deficiency Erin [name] is on Vit D weekly, and she denies nausea, vomiting, or muscle weakness.  3. At risk for impaired metabolic function Erin [name] is at increased risk for impaired metabolic function due to not eating enough protein.  Assessment/Plan:   1. Diabetes mellitus with coincident hypertension ([place]) Good blood sugar control is important to decrease the likelihood of diabetic complications such as nephropathy, neuropathy, limb loss, blindness, coronary artery disease, and death. Intensive lifestyle modification including diet, exercise and weight loss are the first line of treatment for diabetes. We will refill Rybelsus for 1 month.  - Semaglutide (RYBELSUS) 14 MG TABS; Take 14 mg by mouth daily.  Dispense: 30 tablet; Refill: 0  2. Vitamin D deficiency Low Vitamin D level contributes to fatigue and are associated with obesity, breast, and colon cancer. We will refill prescription Vitamin D for 1 month. Erin [name] will follow-up for routine testing of Vitamin D, at least 2-3 times per year to avoid  over-replacement.  - Vitamin D, Ergocalciferol, (DRISDOL) 1.25 MG (50000 UNIT) CAPS capsule; Take 1 capsule (50,000 Units total) by mouth every 7 (seven) days.  Dispense: 4 capsule; Refill: 0  3. At risk for impaired metabolic function Erin [name] was given approximately 15 minutes of impaired  metabolic function prevention counseling today. We discussed intensive lifestyle modifications today with an emphasis on specific nutrition and exercise instructions and strategies.   Repetitive spaced learning was employed today to elicit superior memory formation and behavioral change.  4. Class 3 severe obesity with serious comorbidity and body mass index (BMI) of 45.0 to 49.9 in adult, unspecified obesity type Erin [name]) Erin [name] is currently in the action stage of change. As such, her goal is to continue with weight loss efforts. She has agreed to the Category 3 Plan.   Exercise goals: No exercise has been prescribed at this time.  Behavioral modification strategies: meal planning and cooking strategies and keeping healthy foods in the home.  Erin [name] has agreed to follow-up with our clinic in 3 weeks. She was informed of the importance of frequent follow-up visits to maximize her success with intensive lifestyle modifications for her multiple health conditions.   Objective:   Blood pressure 111/68, pulse 63, temperature 98.4 F (36.9 C), height 5\' 9"  (1.753 m), weight 292 lb (132.5 kg), SpO2 99 %. Body mass index is 43.12 kg/m.  General: Cooperative, alert, well developed, in no acute distress. HEENT: Conjunctivae and lids unremarkable. Cardiovascular: Regular rhythm.  Lungs: Normal work of breathing. Neurologic: No focal deficits.   Lab Results  Component Value Date   CREATININE 0.52 (  L) 09/19/2020   BUN 9 09/19/2020   NA 143 09/19/2020   K 3.8 09/19/2020   CL 105 09/19/2020   CO2 25 09/19/2020   Lab Results  Component Value Date   ALT 15 09/19/2020   AST 18 09/19/2020   ALKPHOS 64  09/19/2020   BILITOT 0.3 09/19/2020   Lab Results  Component Value Date   HGBA1C 7.1 (H) 09/19/2020   HGBA1C 7.2 (H) 05/30/2020   HGBA1C 7.5 12/09/2019   HGBA1C 7.4 (H) 08/10/2019   HGBA1C 8.0 (H) 04/14/2019   Lab Results  Component Value Date   INSULIN 7.6 09/19/2020   INSULIN 17.3 05/30/2020   INSULIN 22.7 08/10/2019   INSULIN 16.3 04/14/2019   INSULIN 24.2 07/16/2018   Lab Results  Component Value Date   TSH 3.690 07/16/2018   Lab Results  Component Value Date   CHOL 126 09/19/2020   HDL 53 09/19/2020   LDLCALC 56 09/19/2020   TRIG 86 09/19/2020   CHOLHDL 2.4 09/19/2020   Lab Results  Component Value Date   WBC 7.4 12/09/2019   HGB 11.0 (A) 12/09/2019   HCT 35 (A) 12/09/2019   MCV 79 07/16/2018   PLT 339 12/09/2019   Lab Results  Component Value Date   IRON 38 (L) 12/20/2016   TIBC 244 12/20/2016   FERRITIN 590 (H) 12/20/2016   Attestation Statements:   Reviewed by clinician on day of visit: allergies, medications, problem list, medical history, surgical history, family history, social history, and previous encounter notes.   Wilhemena Durie, am acting as transcriptionist for [employer], PA-C.  I have reviewed the above documentation for accuracy and completeness, and I agree with the above. Abby Potash, PA-C

## 2020-11-15 ENCOUNTER — Encounter (INDEPENDENT_AMBULATORY_CARE_PROVIDER_SITE_OTHER): Payer: Self-pay

## 2020-11-24 ENCOUNTER — Other Ambulatory Visit: Payer: Self-pay | Admitting: Cardiovascular Disease

## 2020-11-28 ENCOUNTER — Encounter: Payer: Self-pay | Admitting: Orthopaedic Surgery

## 2020-11-28 ENCOUNTER — Ambulatory Visit (INDEPENDENT_AMBULATORY_CARE_PROVIDER_SITE_OTHER): Payer: Worker's Compensation | Admitting: Orthopaedic Surgery

## 2020-11-28 VITALS — BP 138/87 | HR 79 | Ht 69.0 in | Wt 292.0 lb

## 2020-11-28 DIAGNOSIS — M5126 Other intervertebral disc displacement, lumbar region: Secondary | ICD-10-CM

## 2020-11-28 NOTE — Progress Notes (Signed)
Office Visit Note   Patient: Erin [name]           Date of Birth: 08-11-1961           MRN: [id number] Visit Date: 11/28/2020              Requested by: Erin Smoker, MD Erin [name],  Erin [name] [id number] PCP: Erin Smoker, MD   Assessment & Plan: Visit Diagnoses: No diagnosis found.  Plan: Patient has recurrent symptoms previously had a very large extraforaminal disc protrusion at L2-3 with L2 nerve root encroachment present.  She is now having significant pain problems doing her work he is walking with a pronounced limp is not able walk up steps.  We will proceed with new MRI for evaluation for comparison to 2007 MRI.  She states if she does not get some treatment done she is afraid she will get fired from her job.  Office follow-up after MRI scan.  Temporary handicap sticker given x6 months.  Follow-Up Instructions: No follow-ups on file.   Orders:  No orders of the defined types were placed in this encounter.  No orders of the defined types were placed in this encounter.     Procedures: No procedures performed   Clinical Data: No additional findings.   Subjective: Chief Complaint  Patient presents with  . Lower Back - Pain    HPI 60 year old female returns with ongoing problems with back pain left leg pain.  She been having to take  anti-inflammatories daily due to her discomfort.  Pain radiates into the left leg into the thigh stops just below the knee.  She is requesting a 17-month handicap sticker.  She has been trying to work on losing weight but increased back and leg pain is slowing her workout activities.  Review of Systems unchanged from 08/30/2020.  Of note is hypertension, diabetes and morbid obesity although she has lost 9 pounds since 08/30/2020.   Objective: Vital Signs: BP 138/87   Pulse 79   Ht 5\' 9"  (1.753 m)   Wt 292 lb (132.5 kg)   BMI 43.12 kg/m   Physical Exam Constitutional:      Appearance: She is  well-developed.  HENT:     Head: Normocephalic.     Right Ear: External ear normal.     Left Ear: External ear normal.  Eyes:     Pupils: Pupils are equal, round, and reactive to light.  Neck:     Thyroid: No thyromegaly.     Trachea: No tracheal deviation.  Cardiovascular:     Rate and Rhythm: Normal rate.  Pulmonary:     Effort: Pulmonary effort is normal.  Abdominal:     Palpations: Abdomen is soft.  Skin:    General: Skin is warm and dry.  Neurological:     Mental Status: She is alert and oriented to person, place, and time.  Psychiatric:        Behavior: Behavior normal.     Ortho Exam patient has negative logroll of the hips.  And ankle jerk are intact.  She has some pain with a reverse straight leg raising on the left.  Negative contralateral straight leg raise.  Anterior tib gastrocsoleus is strong. Specialty Comments:  No specialty comments available.  Imaging: No results found.   PMFS History: Patient Active Problem List   Diagnosis Date Noted  . Protrusion of lumbar intervertebral disc 01/12/2020  . Low back pain 05/11/2019  . Morbid (severe)  obesity due to excess calories ([place]) 11/25/2016  . Shortness of breath 06/13/2016  . Atypical chest pain 06/13/2016  . Anemia of chronic disease 03/08/2015  . High blood pressure 07/22/2012  . Diabetes mellitus ([place]) 07/22/2012   Past Medical History:  Diagnosis Date  . Anemia   . Atypical chest pain 06/13/2016  . Back pain   . Breast nodule 2009   Right   . Diabetes mellitus   . Fibroid    Symptomatic  . H/O dysmenorrhea   . H/O hematuria   . H/O hypercholesterolemia   . H/O: menorrhagia   . H/O: obesity   . HPV in female   . Hypertension   . Microhematuria 2012  . Shortness of breath 06/13/2016  . Yeast vaginitis 2008    Family History  Problem Relation Age of Onset  . Diabetes Mother   . Valvular heart disease Mother   . Diabetes Brother   . Hypertension Father   . Stroke Father     Past  Surgical History:  Procedure Laterality Date  . ABDOMINAL HYSTERECTOMY    . HYSTEROSCOPY WITH D & C  2003  . POLYPECTOMY  2008  . TUBAL LIGATION  1984   Bilateral   Social History   Occupational History  . Occupation: Glass blower/designer  Tobacco Use  . Smoking status: Never [name]  . Smokeless tobacco: Never Used  Vaping Use  . Vaping Use: Never used  Substance and Sexual Activity  . Alcohol use: No  . Drug use: No  . Sexual activity: Never    Comment: hysterectomy

## 2020-11-30 ENCOUNTER — Ambulatory Visit (INDEPENDENT_AMBULATORY_CARE_PROVIDER_SITE_OTHER): Payer: 59 | Admitting: Physician Assistant

## 2020-12-03 ENCOUNTER — Other Ambulatory Visit (INDEPENDENT_AMBULATORY_CARE_PROVIDER_SITE_OTHER): Payer: Self-pay | Admitting: Physician Assistant

## 2020-12-03 DIAGNOSIS — I10 Essential (primary) hypertension: Secondary | ICD-10-CM

## 2020-12-03 DIAGNOSIS — E119 Type 2 diabetes mellitus without complications: Secondary | ICD-10-CM

## 2020-12-04 NOTE — Telephone Encounter (Signed)
Last OV with [name] 

## 2020-12-06 ENCOUNTER — Encounter (INDEPENDENT_AMBULATORY_CARE_PROVIDER_SITE_OTHER): Payer: Self-pay

## 2020-12-06 NOTE — Telephone Encounter (Signed)
Message sent to pt.

## 2020-12-07 ENCOUNTER — Encounter: Payer: Self-pay | Admitting: *Deleted

## 2020-12-12 ENCOUNTER — Ambulatory Visit: Payer: 59 | Admitting: Orthopaedic Surgery

## 2020-12-21 ENCOUNTER — Other Ambulatory Visit: Payer: Self-pay

## 2020-12-21 ENCOUNTER — Ambulatory Visit (INDEPENDENT_AMBULATORY_CARE_PROVIDER_SITE_OTHER): Payer: 59 | Admitting: Physician Assistant

## 2020-12-21 VITALS — BP 116/73 | HR 67 | Temp 98.2°F | Ht 69.0 in | Wt 292.0 lb

## 2020-12-21 DIAGNOSIS — I10 Essential (primary) hypertension: Secondary | ICD-10-CM | POA: Diagnosis not present

## 2020-12-21 DIAGNOSIS — E119 Type 2 diabetes mellitus without complications: Secondary | ICD-10-CM | POA: Diagnosis not present

## 2020-12-21 DIAGNOSIS — E559 Vitamin D deficiency, unspecified: Secondary | ICD-10-CM

## 2020-12-21 DIAGNOSIS — Z9189 Other specified personal risk factors, not elsewhere classified: Secondary | ICD-10-CM

## 2020-12-21 MED ORDER — RYBELSUS 14 MG PO TABS: 14.0000 mg | ORAL_TABLET | Freq: Every day | ORAL | 0 refills | Status: DC

## 2020-12-21 MED ORDER — VITAMIN D (ERGOCALCIFEROL) 1.25 MG (50000 UNIT) PO CAPS: 50000.0000 [IU] | ORAL_CAPSULE | ORAL | 0 refills | Status: DC

## 2020-12-25 NOTE — Progress Notes (Signed)
Chief Complaint:   OBESITY Erin [name] is here to discuss her progress with her obesity treatment plan along with follow-up of her obesity related diagnoses. [name] is on the Category 3 Plan and states she is following her eating plan approximately 40% of the time. [name] states she is not currently exercising.  Today's visit was #: 59 Starting weight: 330 lbs Starting date: 07/16/2018 Today's weight: 292 lbs Today's date: 12/21/2020 Total lbs lost to date: 38 Total lbs lost since last in-office visit: 0  Interim History: Erin [name] is working 12 hour shifts 6 days a week. There are times she only eats 2 meals a day. She noticed a decline in her energy level when only eating 2 meals daily.  Subjective:   1. Vitamin D deficiency Erin [name] denies nausea, vomiting, and muscle weakness. She is on prescription Vit D.  2. Diabetes mellitus with coincident hypertension (HCC) Erin [name] is on Rybelsus and tolerating it well. Her last A1c was 7.1.  3. At risk for heart disease [name] is at a higher than average risk for cardiovascular disease due to obesity.   Assessment/Plan:   1. Vitamin D deficiency Low Vitamin D level contributes to fatigue and are associated with obesity, breast, and colon cancer. She agrees to continue to take prescription Vitamin D @50 ,000 IU every week and will follow-up for routine testing of Vitamin D, at least 2-3 times per year to avoid over-replacement.  - Vitamin D, Ergocalciferol, (DRISDOL) 1.25 MG (50000 UNIT) CAPS capsule; Take 1 capsule (50,000 Units total) by mouth every 7 (seven) days.  Dispense: 4 capsule; Refill: 0  2. Diabetes mellitus with coincident hypertension ([place]) Good blood sugar control is important to decrease the likelihood of diabetic complications such as nephropathy, neuropathy, limb loss, blindness, coronary artery disease, and death. Intensive lifestyle modification including diet, exercise and weight loss are the first line of treatment for  diabetes.   - Semaglutide (RYBELSUS) 14 MG TABS; Take 14 mg by mouth daily.  Dispense: 30 tablet; Refill: 0  3. At risk for heart disease Erin [name] was given approximately 15 minutes of coronary artery disease prevention counseling today. She is 60 y.o. female and has risk factors for heart disease including obesity. We discussed intensive lifestyle modifications today with an emphasis on specific weight loss instructions and strategies.   Repetitive spaced learning was employed today to elicit superior memory formation and behavioral change.  4. Class 3 severe obesity with serious comorbidity and body mass index (BMI) of 40.0 to 44.9 in adult, unspecified obesity type [facility]) Erin [name] is currently in the action stage of change. As such, her goal is to continue with weight loss efforts. She has agreed to the Category 3 Plan.   Fasting labs at next visit.  Exercise goals: No exercise has been prescribed at this time.  Behavioral modification strategies: no skipping meals and meal planning and cooking strategies.  [name] has agreed to follow-up with our clinic in 3 weeks. She was informed of the importance of frequent follow-up visits to maximize her success with intensive lifestyle modifications for her multiple health conditions.   Objective:   Blood pressure 116/73, pulse 67, temperature 98.2 F (36.8 C), height 5\' 9"  (1.753 m), weight 292 lb (132.5 kg), SpO2 100 %. Body mass index is 43.12 kg/m.  General: Cooperative, alert, well developed, in no acute distress. HEENT: Conjunctivae and lids unremarkable. Cardiovascular: Regular rhythm.  Lungs: Normal work of breathing. Neurologic: No focal deficits.   Lab Results  Component Value Date  CREATININE 0.52 (L) 09/19/2020   BUN 9 09/19/2020   NA 143 09/19/2020   K 3.8 09/19/2020   CL 105 09/19/2020   CO2 25 09/19/2020   Lab Results  Component Value Date   ALT 15 09/19/2020   AST 18 09/19/2020   ALKPHOS 64 09/19/2020   BILITOT 0.3  09/19/2020   Lab Results  Component Value Date   HGBA1C 7.1 (H) 09/19/2020   HGBA1C 7.2 (H) 05/30/2020   HGBA1C 7.5 12/09/2019   HGBA1C 7.4 (H) 08/10/2019   HGBA1C 8.0 (H) 04/14/2019   Lab Results  Component Value Date   INSULIN 7.6 09/19/2020   INSULIN 17.3 05/30/2020   INSULIN 22.7 08/10/2019   INSULIN 16.3 04/14/2019   INSULIN 24.2 07/16/2018   Lab Results  Component Value Date   TSH 3.690 07/16/2018   Lab Results  Component Value Date   CHOL 126 09/19/2020   HDL 53 09/19/2020   LDLCALC 56 09/19/2020   TRIG 86 09/19/2020   CHOLHDL 2.4 09/19/2020   Lab Results  Component Value Date   WBC 7.4 12/09/2019   HGB 11.0 (A) 12/09/2019   HCT 35 (A) 12/09/2019   MCV 79 07/16/2018   PLT 339 12/09/2019   Lab Results  Component Value Date   IRON 38 (L) 12/20/2016   TIBC 244 12/20/2016   FERRITIN 590 (H) 12/20/2016    Attestation Statements:   Reviewed by clinician on day of visit: allergies, medications, problem list, medical history, surgical history, family history, social history, and previous encounter notes.  Coral Ceo, am acting as Location manager for [employer], PA-C.  I have reviewed the above documentation for accuracy and completeness, and I agree with the above. Abby Potash, PA-C

## 2021-01-11 ENCOUNTER — Ambulatory Visit (INDEPENDENT_AMBULATORY_CARE_PROVIDER_SITE_OTHER): Payer: 59 | Admitting: Physician Assistant

## 2021-01-11 ENCOUNTER — Other Ambulatory Visit: Payer: Self-pay

## 2021-01-11 ENCOUNTER — Encounter (INDEPENDENT_AMBULATORY_CARE_PROVIDER_SITE_OTHER): Payer: Self-pay | Admitting: Physician Assistant

## 2021-01-11 VITALS — BP 112/68 | HR 63 | Temp 97.9°F | Ht 69.0 in | Wt 293.0 lb

## 2021-01-11 DIAGNOSIS — E1169 Type 2 diabetes mellitus with other specified complication: Secondary | ICD-10-CM | POA: Diagnosis not present

## 2021-01-11 DIAGNOSIS — E119 Type 2 diabetes mellitus without complications: Secondary | ICD-10-CM

## 2021-01-11 DIAGNOSIS — E785 Hyperlipidemia, unspecified: Secondary | ICD-10-CM

## 2021-01-11 DIAGNOSIS — Z9189 Other specified personal risk factors, not elsewhere classified: Secondary | ICD-10-CM | POA: Diagnosis not present

## 2021-01-11 DIAGNOSIS — E559 Vitamin D deficiency, unspecified: Secondary | ICD-10-CM | POA: Diagnosis not present

## 2021-01-11 DIAGNOSIS — Z6841 Body Mass Index (BMI) 40.0 and over, adult: Secondary | ICD-10-CM

## 2021-01-11 DIAGNOSIS — I10 Essential (primary) hypertension: Secondary | ICD-10-CM

## 2021-01-11 MED ORDER — VITAMIN D (ERGOCALCIFEROL) 1.25 MG (50000 UNIT) PO CAPS: 50000.0000 [IU] | ORAL_CAPSULE | ORAL | 0 refills | Status: DC

## 2021-01-11 NOTE — Progress Notes (Signed)
Chief Complaint:   OBESITY Erin [name] is here to discuss her progress with her obesity treatment plan along with follow-up of her obesity related diagnoses. Erin [name] is on the Category 3 Plan and states she is following her eating plan approximately 50% of the time. Erin [name] states she is walking 13,000 steps daily.   Today's visit was #: 1 Starting weight: 330 lbs Starting date: 07/16/2018 Today's weight: 293 lbs Today's date: 01/11/2021 Total lbs lost to date: 37 Total lbs lost since last in-office visit: 0  Interim History: Erin [name] reports that she has not been meal planning. She is not skipping meals but she is feeling hungrier. She may be over snacking with nuts.  Subjective:   1. Hyperlipidemia associated with type 2 diabetes mellitus (Erin [name]) Erin [name] is on Crestor, and she denies chest pain or myalgias.  2. Diabetes mellitus with coincident hypertension (Erin [name]) Erin [name] is on Rybelsus and metformin, and she denies hypoglycemia.  3. Vitamin D deficiency Erin [name] is on Vit D weekly.  4. At risk for hypoglycemia Erin [name] is at increased risk for hypoglycemia due to changes in diet, diagnosis of diabetes, and/or insulin use.    Assessment/Plan:   1. Hyperlipidemia associated with type 2 diabetes mellitus (Erin [name]) Cardiovascular risk and specific lipid/LDL goals reviewed. We discussed several lifestyle modifications today. Erin [name] will continue to work on diet, exercise and weight loss efforts. We will check labs today. Orders and follow up as documented in patient record.   Counseling Intensive lifestyle modifications are the first line treatment for this issue. . Dietary changes: Increase soluble fiber. Decrease simple carbohydrates. . Exercise changes: Moderate to vigorous-intensity aerobic activity 150 minutes per week if tolerated. . Lipid-lowering medications: see documented in medical record.  - Lipid panel  2. Diabetes mellitus with coincident hypertension (Erin [name]) We will check labs  today, and Erin [name] will continue to follow up as directed. Good blood sugar control is important to decrease the likelihood of diabetic complications such as nephropathy, neuropathy, limb loss, blindness, coronary artery disease, and death. Intensive lifestyle modification including diet, exercise and weight loss are the first line of treatment for diabetes.   - Hemoglobin A1c - Comprehensive metabolic panel - Insulin, random  3. Vitamin D deficiency Low Vitamin D level contributes to fatigue and are associated with obesity, breast, and colon cancer. We will check labs today. We will refill prescription Vitamin D for 1 month. Erin [name] will follow-up for routine testing of Vitamin D, at least 2-3 times per year to avoid over-replacement.  - VITAMIN D 25 Hydroxy (Vit-D Deficiency, Fractures) - Vitamin D, Ergocalciferol, (DRISDOL) 1.25 MG (50000 UNIT) CAPS capsule; Take 1 capsule (50,000 Units total) by mouth every 7 (seven) days.  Dispense: 4 capsule; Refill: 0  4. At risk for hypoglycemia Erin [name] was given approximately 15 minutes of counseling today regarding prevention of hypoglycemia. She was advised of symptoms of hypoglycemia. Erin [name] was instructed to avoid skipping meals, eat regular protein rich meals and schedule low calorie snacks as needed.   Repetitive spaced learning was employed today to elicit superior memory formation and behavioral change  5. Class 3 severe obesity with serious comorbidity and body mass index (BMI) of 45.0 to 49.9 in adult, unspecified obesity type Erin [name]) Erin [name] is currently in the action stage of change. As such, her goal is to continue with weight loss efforts. She has agreed to the Category 3 Plan.   Topamax was discussed with the patient today, but she declines.  Exercise goals: As is.  Behavioral  modification strategies: meal planning and cooking strategies and better snacking choices.  Erin [name] has agreed to follow-up with our clinic in 2 weeks. She was  informed of the importance of frequent follow-up visits to maximize her success with intensive lifestyle modifications for her multiple health conditions.   Erin [name] was informed we would discuss her lab results at her next visit unless there is a critical issue that needs to be addressed sooner. Erin [name] agreed to keep her next visit at the agreed upon time to discuss these results.  Objective:   Blood pressure 112/68, pulse 63, temperature 97.9 F (36.6 C), height 5\' 9"  (1.753 m), weight 293 lb (132.9 kg), SpO2 100 %. Body mass index is 43.27 kg/m.  General: Cooperative, alert, well developed, in no acute distress. HEENT: Conjunctivae and lids unremarkable. Cardiovascular: Regular rhythm.  Lungs: Normal work of breathing. Neurologic: No focal deficits.   Lab Results  Component Value Date   CREATININE 0.52 (L) 09/19/2020   BUN 9 09/19/2020   NA 143 09/19/2020   K 3.8 09/19/2020   CL 105 09/19/2020   CO2 25 09/19/2020   Lab Results  Component Value Date   ALT 15 09/19/2020   AST 18 09/19/2020   ALKPHOS 64 09/19/2020   BILITOT 0.3 09/19/2020   Lab Results  Component Value Date   HGBA1C 7.1 (H) 09/19/2020   HGBA1C 7.2 (H) 05/30/2020   HGBA1C 7.5 12/09/2019   HGBA1C 7.4 (H) 08/10/2019   HGBA1C 8.0 (H) 04/14/2019   Lab Results  Component Value Date   INSULIN 7.6 09/19/2020   INSULIN 17.3 05/30/2020   INSULIN 22.7 08/10/2019   INSULIN 16.3 04/14/2019   INSULIN 24.2 07/16/2018   Lab Results  Component Value Date   TSH 3.690 07/16/2018   Lab Results  Component Value Date   CHOL 126 09/19/2020   HDL 53 09/19/2020   LDLCALC 56 09/19/2020   TRIG 86 09/19/2020   CHOLHDL 2.4 09/19/2020   Lab Results  Component Value Date   WBC 7.4 12/09/2019   HGB 11.0 (A) 12/09/2019   HCT 35 (A) 12/09/2019   MCV 79 07/16/2018   PLT 339 12/09/2019   Lab Results  Component Value Date   IRON 38 (L) 12/20/2016   TIBC 244 12/20/2016   FERRITIN 590 (H) 12/20/2016   Attestation  Statements:   Reviewed by clinician on day of visit: allergies, medications, problem list, medical history, surgical history, family history, social history, and previous encounter notes.   Wilhemena Durie, am acting as transcriptionist for [employer], PA-C.  I have reviewed the above documentation for accuracy and completeness, and I agree with the above. Abby Potash, PA-C

## 2021-01-12 LAB — LIPID PANEL
Chol/HDL Ratio: 3.2 ratio (ref 0.0–4.4)
Cholesterol, Total: 162 mg/dL (ref 100–199)
HDL: 50 mg/dL (ref >39)
LDL Chol Calc (NIH): 96 mg/dL (ref 0–99)
Triglycerides: 82 mg/dL (ref 0–149)
VLDL Cholesterol Cal: 16 mg/dL (ref 5–40)

## 2021-01-12 LAB — COMPREHENSIVE METABOLIC PANEL
ALT: 15 IU/L (ref 0–32)
AST: 16 IU/L (ref 0–40)
Albumin/Globulin Ratio: 1.7 (ref 1.2–2.2)
Albumin: 4.1 g/dL (ref 3.8–4.9)
Alkaline Phosphatase: 85 IU/L (ref 44–121)
BUN/Creatinine Ratio: 18 (ref 9–23)
BUN: 10 mg/dL (ref 6–24)
Bilirubin Total: 0.3 mg/dL (ref 0.0–1.2)
CO2: 26 mmol/L (ref 20–29)
Calcium: 9.6 mg/dL (ref 8.7–10.2)
Chloride: 102 mmol/L (ref 96–106)
Creatinine, Ser: 0.57 mg/dL (ref 0.57–1.00)
Globulin, Total: 2.4 g/dL (ref 1.5–4.5)
Glucose: 94 mg/dL (ref 65–99)
Potassium: 4.2 mmol/L (ref 3.5–5.2)
Sodium: 141 mmol/L (ref 134–144)
Total Protein: 6.5 g/dL (ref 6.0–8.5)
eGFR: 105 mL/min/{1.73_m2} (ref >59)

## 2021-01-12 LAB — INSULIN, RANDOM: INSULIN: 12.5 u[IU]/mL (ref 2.6–24.9)

## 2021-01-12 LAB — HEMOGLOBIN A1C
Est. average glucose Bld gHb Est-mCnc: 143 mg/dL
Hgb A1c MFr Bld: 6.6 % — ABNORMAL HIGH (ref 4.8–5.6)

## 2021-01-12 LAB — VITAMIN D 25 HYDROXY (VIT D DEFICIENCY, FRACTURES): Vit D, 25-Hydroxy: 47.7 ng/mL (ref 30.0–100.0)

## 2021-02-01 ENCOUNTER — Ambulatory Visit (INDEPENDENT_AMBULATORY_CARE_PROVIDER_SITE_OTHER): Payer: 59 | Admitting: Physician Assistant

## 2021-02-05 ENCOUNTER — Other Ambulatory Visit (INDEPENDENT_AMBULATORY_CARE_PROVIDER_SITE_OTHER): Payer: Self-pay | Admitting: Physician Assistant

## 2021-02-05 DIAGNOSIS — I10 Essential (primary) hypertension: Secondary | ICD-10-CM

## 2021-02-05 DIAGNOSIS — E119 Type 2 diabetes mellitus without complications: Secondary | ICD-10-CM

## 2021-02-06 ENCOUNTER — Encounter (INDEPENDENT_AMBULATORY_CARE_PROVIDER_SITE_OTHER): Payer: Self-pay

## 2021-02-07 NOTE — Telephone Encounter (Signed)
Pt last seen by [name] Aguilar, PA-C.  

## 2021-02-13 ENCOUNTER — Telehealth: Payer: Self-pay | Admitting: Orthopaedic Surgery

## 2021-02-13 NOTE — Telephone Encounter (Signed)
Received vm from [place] w/ [employer] checking status of request. IC,lmvm [phone] advised records faxed 5/13 to [phone].

## 2021-02-19 ENCOUNTER — Telehealth (INDEPENDENT_AMBULATORY_CARE_PROVIDER_SITE_OTHER): Payer: Self-pay

## 2021-02-19 NOTE — Telephone Encounter (Signed)
Last seen [name] 

## 2021-02-19 NOTE — Telephone Encounter (Signed)
Pt called in and stated that she requested a refill but the pharmacy said she needs to call the office to make a appt. The pt is requesting a refill on her Rybelsus sent to [facility] in [place]. Please advise

## 2021-02-20 ENCOUNTER — Other Ambulatory Visit (INDEPENDENT_AMBULATORY_CARE_PROVIDER_SITE_OTHER): Payer: Self-pay | Admitting: Adult Health

## 2021-02-20 DIAGNOSIS — E119 Type 2 diabetes mellitus without complications: Secondary | ICD-10-CM

## 2021-02-20 DIAGNOSIS — I10 Essential (primary) hypertension: Secondary | ICD-10-CM

## 2021-02-20 MED ORDER — RYBELSUS 14 MG PO TABS: 14.0000 mg | ORAL_TABLET | Freq: Every day | ORAL | 0 refills | Status: DC

## 2021-02-20 NOTE — Telephone Encounter (Signed)
Okay to refill now that pt has scheduled an appt?

## 2021-02-27 ENCOUNTER — Ambulatory Visit (INDEPENDENT_AMBULATORY_CARE_PROVIDER_SITE_OTHER): Payer: 59 | Admitting: Physician Assistant

## 2021-03-15 ENCOUNTER — Other Ambulatory Visit: Payer: Self-pay

## 2021-03-15 ENCOUNTER — Encounter (INDEPENDENT_AMBULATORY_CARE_PROVIDER_SITE_OTHER): Payer: Self-pay | Admitting: Physician Assistant

## 2021-03-15 ENCOUNTER — Ambulatory Visit (INDEPENDENT_AMBULATORY_CARE_PROVIDER_SITE_OTHER): Payer: 59 | Admitting: Physician Assistant

## 2021-03-15 VITALS — BP 128/75 | HR 65 | Temp 98.2°F | Ht 68.0 in | Wt 296.0 lb

## 2021-03-15 DIAGNOSIS — Z6841 Body Mass Index (BMI) 40.0 and over, adult: Secondary | ICD-10-CM

## 2021-03-15 DIAGNOSIS — Z9189 Other specified personal risk factors, not elsewhere classified: Secondary | ICD-10-CM | POA: Diagnosis not present

## 2021-03-15 DIAGNOSIS — I10 Essential (primary) hypertension: Secondary | ICD-10-CM | POA: Diagnosis not present

## 2021-03-15 DIAGNOSIS — E559 Vitamin D deficiency, unspecified: Secondary | ICD-10-CM

## 2021-03-15 DIAGNOSIS — E119 Type 2 diabetes mellitus without complications: Secondary | ICD-10-CM

## 2021-03-15 MED ORDER — RYBELSUS 14 MG PO TABS
14.0000 mg | ORAL_TABLET | Freq: Every day | ORAL | 0 refills | Status: DC
Start: 2021-03-15 — End: 2021-04-10

## 2021-03-15 MED ORDER — VITAMIN D (ERGOCALCIFEROL) 1.25 MG (50000 UNIT) PO CAPS: 50000.0000 [IU] | ORAL_CAPSULE | ORAL | 0 refills | Status: DC

## 2021-03-22 NOTE — Progress Notes (Signed)
Chief Complaint:   OBESITY Erin [name] is here to discuss her progress with her obesity treatment plan along with follow-up of her obesity related diagnoses. Erin [name] is on the Category 3 Plan and states she is following her eating plan approximately 50-55% of the time. Erin [name] states she is doing 0 minutes 0 times per week.  Today's visit was #: 68 Starting weight: 330 lbs Starting date: 07/16/2018 Today's weight: 296 lbs Today's date: 03/15/2021 Total lbs lost to date: 34 Total lbs lost since last in-office visit: 0  Interim History: Erin [name] reports that she has been busy with work and she has not been following her plan closely. She is on vacation this week and her routine is off. She is skipping lunch when she is off work.  Subjective:   1. Diabetes mellitus with coincident hypertension (Erin [name]) Erin [name] is on Rybelsus and metformin. Last A1c was 6.6, well controlled. I discussed labs with the patient today.  2. Vitamin D deficiency Erin [name] is on Vit D weekly. Last Vit D level was 47.7. I discussed labs with the patient today.  3. At risk for heart disease Erin [name] is at a higher than average risk for cardiovascular disease due to obesity.   Assessment/Plan:   1. Diabetes mellitus with coincident hypertension (Erin [name]) Erin [name] will continue her medications, and we will refill Rybelsus for 1 month. Good blood sugar control is important to decrease the likelihood of diabetic complications such as nephropathy, neuropathy, limb loss, blindness, coronary artery disease, and death. Intensive lifestyle modification including diet, exercise and weight loss are the first line of treatment for diabetes.   - Semaglutide (RYBELSUS) 14 MG TABS; Take 14 mg by mouth daily.  Dispense: 30 tablet; Refill: 0  2. Vitamin D deficiency Low Vitamin D level contributes to fatigue and are associated with obesity, breast, and colon cancer. We will refill prescription Vitamin D for 1 month. Erin [name] will follow-up for  routine testing of Vitamin D, at least 2-3 times per year to avoid over-replacement.  - Vitamin D, Ergocalciferol, (DRISDOL) 1.25 MG (50000 UNIT) CAPS capsule; Take 1 capsule (50,000 Units total) by mouth every 7 (seven) days.  Dispense: 4 capsule; Refill: 0  3. At risk for heart disease Erin [name] was given approximately 15 minutes of coronary artery disease prevention counseling today. She is 60 y.o. female and has risk factors for heart disease including obesity. We discussed intensive lifestyle modifications today with an emphasis on specific weight loss instructions and strategies.   Repetitive spaced learning was employed today to elicit superior memory formation and behavioral change.  4. Class 3 severe obesity with serious comorbidity and body mass index (BMI) of 45.0 to 49.9 in adult, unspecified obesity type (Erin [name]); current BMI 45.02 Erin [name] is currently in the action stage of change. As such, her goal is to continue with weight loss efforts. She has agreed to the Category 3 Plan.   Exercise goals: No exercise has been prescribed at this time.  Behavioral modification strategies: no skipping meals and meal planning and cooking strategies.  Erin [name] has agreed to follow-up with our clinic in 2 weeks. She was informed of the importance of frequent follow-up visits to maximize her success with intensive lifestyle modifications for her multiple health conditions.   Objective:   Blood pressure 128/75, pulse 65, temperature 98.2 F (36.8 C), height 5\' 8"  (1.727 m), weight 296 lb (134.3 kg), SpO2 100 %. Body mass index is 45.01 kg/m.  General: Cooperative, alert, well developed, in no acute distress.  HEENT: Conjunctivae and lids unremarkable. Cardiovascular: Regular rhythm.  Lungs: Normal work of breathing. Neurologic: No focal deficits.   Lab Results  Component Value Date   CREATININE 0.57 01/11/2021   BUN 10 01/11/2021   NA 141 01/11/2021   K 4.2 01/11/2021   CL 102 01/11/2021    CO2 26 01/11/2021   Lab Results  Component Value Date   ALT 15 01/11/2021   AST 16 01/11/2021   ALKPHOS 85 01/11/2021   BILITOT 0.3 01/11/2021   Lab Results  Component Value Date   HGBA1C 6.6 (H) 01/11/2021   HGBA1C 7.1 (H) 09/19/2020   HGBA1C 7.2 (H) 05/30/2020   HGBA1C 7.5 12/09/2019   HGBA1C 7.4 (H) 08/10/2019   Lab Results  Component Value Date   INSULIN 12.5 01/11/2021   INSULIN 7.6 09/19/2020   INSULIN 17.3 05/30/2020   INSULIN 22.7 08/10/2019   INSULIN 16.3 04/14/2019   Lab Results  Component Value Date   TSH 3.690 07/16/2018   Lab Results  Component Value Date   CHOL 162 01/11/2021   HDL 50 01/11/2021   LDLCALC 96 01/11/2021   TRIG 82 01/11/2021   CHOLHDL 3.2 01/11/2021   Lab Results  Component Value Date   VD25OH 47.7 01/11/2021   VD25OH 46.6 09/19/2020   VD25OH 39.5 05/30/2020   Lab Results  Component Value Date   WBC 7.4 12/09/2019   HGB 11.0 (A) 12/09/2019   HCT 35 (A) 12/09/2019   MCV 79 07/16/2018   PLT 339 12/09/2019   Lab Results  Component Value Date   IRON 38 (L) 12/20/2016   TIBC 244 12/20/2016   FERRITIN 590 (H) 12/20/2016   Attestation Statements:   Reviewed by clinician on day of visit: allergies, medications, problem list, medical history, surgical history, family history, social history, and previous encounter notes.   Wilhemena Durie, am acting as transcriptionist for [employer], PA-C.  I have reviewed the above documentation for accuracy and completeness, and I agree with the above. Abby Potash, PA-C

## 2021-04-10 ENCOUNTER — Other Ambulatory Visit: Payer: Self-pay

## 2021-04-10 ENCOUNTER — Ambulatory Visit (INDEPENDENT_AMBULATORY_CARE_PROVIDER_SITE_OTHER): Payer: 59 | Admitting: Physician Assistant

## 2021-04-10 ENCOUNTER — Encounter (INDEPENDENT_AMBULATORY_CARE_PROVIDER_SITE_OTHER): Payer: Self-pay | Admitting: Physician Assistant

## 2021-04-10 VITALS — BP 119/73 | HR 64 | Temp 98.0°F | Ht 68.0 in | Wt 295.0 lb

## 2021-04-10 DIAGNOSIS — Z9189 Other specified personal risk factors, not elsewhere classified: Secondary | ICD-10-CM | POA: Diagnosis not present

## 2021-04-10 DIAGNOSIS — E119 Type 2 diabetes mellitus without complications: Secondary | ICD-10-CM

## 2021-04-10 DIAGNOSIS — Z6841 Body Mass Index (BMI) 40.0 and over, adult: Secondary | ICD-10-CM

## 2021-04-10 DIAGNOSIS — I10 Essential (primary) hypertension: Secondary | ICD-10-CM

## 2021-04-10 DIAGNOSIS — E559 Vitamin D deficiency, unspecified: Secondary | ICD-10-CM | POA: Diagnosis not present

## 2021-04-10 MED ORDER — RYBELSUS 14 MG PO TABS: 14.0000 mg | ORAL_TABLET | Freq: Every day | ORAL | 0 refills | Status: DC

## 2021-04-10 MED ORDER — VITAMIN D (ERGOCALCIFEROL) 1.25 MG (50000 UNIT) PO CAPS
50000.0000 [IU] | ORAL_CAPSULE | ORAL | 0 refills | Status: DC
Start: 2021-04-10 — End: 2021-05-15

## 2021-04-11 ENCOUNTER — Encounter: Payer: Self-pay | Admitting: Orthopaedic Surgery

## 2021-04-11 ENCOUNTER — Ambulatory Visit (INDEPENDENT_AMBULATORY_CARE_PROVIDER_SITE_OTHER): Payer: Worker's Compensation | Admitting: Orthopaedic Surgery

## 2021-04-11 VITALS — BP 127/77 | HR 66 | Ht 69.0 in | Wt 292.0 lb

## 2021-04-11 DIAGNOSIS — M5126 Other intervertebral disc displacement, lumbar region: Secondary | ICD-10-CM | POA: Diagnosis not present

## 2021-04-11 NOTE — Progress Notes (Signed)
Chief Complaint:   OBESITY Erin [name] is here to discuss her progress with her obesity treatment plan along with follow-up of her obesity related diagnoses. Erin [name] is on the Category 3 Plan and states she is following her eating plan approximately 60% of the time. Erin [name] states she is doing 0 minutes 0 times per week.  Today's visit was #: 33 Starting weight: 330 lbs Starting date: 07/16/2018 Today's weight: 295 lbs Today's date: 04/10/2021 Total lbs lost to date: 35 lbs Total lbs lost since last in-office visit: 1 lb  Interim History: Articia is working 7 days a week now. She does well with her plan for 3 days during the week and then struggles the other 4 days of the week. She eats out more often when she does not plan her meals.  Subjective:   1. Diabetes mellitus with coincident hypertension (Erin [name]) Erin [name] is on Rybelsus and Metformin. Her last A1C level was 6.6 on 5/22. She is tolerating it well. I discussed Ozempic with Erin [name] today but she declines.  2. Vitamin D deficiency Erin [name] is on Vitamin D weekly. She is tolerating it well.  3. At risk for hypoglycemia Erin [name] is at risk for hypoglycemia due to obesity and diabetes.  Assessment/Plan:   1. Diabetes mellitus with coincident hypertension (Erin [name]) Good blood sugar control is important to decrease the likelihood of diabetic complications such as nephropathy, neuropathy, limb loss, blindness, coronary artery disease, and death. Intensive lifestyle modification including diet, exercise and weight loss are the first line of treatment for diabetes. We will refill Rybelsus for 1 month with no refills.  - Semaglutide (RYBELSUS) 14 MG TABS; Take 14 mg by mouth daily.  Dispense: 30 tablet; Refill: 0  2. Vitamin D deficiency Low Vitamin D level contributes to fatigue and are associated with obesity, breast, and colon cancer. We will refill  prescription Vitamin D 50,000 IU every week for 1 month and Erin [name] will follow-up for routine  testing of Vitamin D, at least 2-3 times per year to avoid over-replacement.  - Vitamin D, Ergocalciferol, (DRISDOL) 1.25 MG (50000 UNIT) CAPS capsule; Take 1 capsule (50,000 Units total) by mouth every 7 (seven) days.  Dispense: 4 capsule; Refill: 0  3. At risk for hypoglycemia   4. Class 3 severe obesity with serious comorbidity and body mass index (BMI) of 45.0 to 49.9 in adult, unspecified obesity type (Erin [name]); current BMI 44.86 Erin [name] is currently in the action stage of change. As such, her goal is to continue with weight loss efforts. She has agreed to the Category 3 Plan.   Exercise goals: No exercise has been prescribed at this time.  Behavioral modification strategies: meal planning and cooking strategies and keeping healthy foods in the home.  Erin [name] has agreed to follow-up with our clinic in 3 weeks. She was informed of the importance of frequent follow-up visits to maximize her success with intensive lifestyle modifications for her multiple health conditions.   Objective:   Blood pressure 119/73, pulse 64, temperature 98 F (36.7 C), height '5\' 8"'$  (1.727 m), weight 295 lb (133.8 kg), SpO2 98 %. Body mass index is 44.85 kg/m.  General: Cooperative, alert, well developed, in no acute distress. HEENT: Conjunctivae and lids unremarkable. Cardiovascular: Regular rhythm.  Lungs: Normal work of breathing. Neurologic: No focal deficits.   Lab Results  Component Value Date   CREATININE 0.57 01/11/2021   BUN 10 01/11/2021   NA 141 01/11/2021   K 4.2 01/11/2021   CL 102 01/11/2021  CO2 26 01/11/2021   Lab Results  Component Value Date   ALT 15 01/11/2021   AST 16 01/11/2021   ALKPHOS 85 01/11/2021   BILITOT 0.3 01/11/2021   Lab Results  Component Value Date   HGBA1C 6.6 (H) 01/11/2021   HGBA1C 7.1 (H) 09/19/2020   HGBA1C 7.2 (H) 05/30/2020   HGBA1C 7.5 12/09/2019   HGBA1C 7.4 (H) 08/10/2019   Lab Results  Component Value Date   INSULIN 12.5 01/11/2021   INSULIN  7.6 09/19/2020   INSULIN 17.3 05/30/2020   INSULIN 22.7 08/10/2019   INSULIN 16.3 04/14/2019   Lab Results  Component Value Date   TSH 3.690 07/16/2018   Lab Results  Component Value Date   CHOL 162 01/11/2021   HDL 50 01/11/2021   LDLCALC 96 01/11/2021   TRIG 82 01/11/2021   CHOLHDL 3.2 01/11/2021   Lab Results  Component Value Date   VD25OH 47.7 01/11/2021   VD25OH 46.6 09/19/2020   VD25OH 39.5 05/30/2020   Lab Results  Component Value Date   WBC 7.4 12/09/2019   HGB 11.0 (A) 12/09/2019   HCT 35 (A) 12/09/2019   MCV 79 07/16/2018   PLT 339 12/09/2019   Lab Results  Component Value Date   IRON 38 (L) 12/20/2016   TIBC 244 12/20/2016   FERRITIN 590 (H) 12/20/2016   Attestation Statements:   Reviewed by clinician on day of visit: allergies, medications, problem list, medical history, surgical history, family history, social history, and previous encounter notes.  I, Tonye Pearson, am acting as Location manager for [employer], PA-C.   I have reviewed the above documentation for accuracy and completeness, and I agree with the above. Abby Potash, PA-C

## 2021-04-12 NOTE — Progress Notes (Signed)
Office Visit Note   Patient: Erin [name]           Date of Birth: 1961/09/01           MRN: [id number] Visit Date: 04/11/2021              Requested by: Glenis Smoker, MD [place],  [place] [id number] PCP: Glenis Smoker, MD   Assessment & Plan: Visit Diagnoses:  1. Protrusion of lumbar intervertebral disc     Plan: Patient will get the disc and return to the office to review the images and discussed outlined treatment plan.  Follow-Up Instructions: Return in about 1 week (around 04/18/2021).   Orders:  No orders of the defined types were placed in this encounter.  No orders of the defined types were placed in this encounter.     Procedures: No procedures performed   Clinical Data: No additional findings.   Subjective: Chief Complaint  Patient presents with   Lower Back - Pain, Follow-up    MRI lumbar review    HPI 60 year old female returns.  We put in for an MRI request back in March and now 5 months later looks like it finally got performed.  Patient is here we have a copy of the report that we could obtain) out but we do not have the disc no images.  Patient's been using Advil and gabapentin which feels like it eases the pain pain runs down her left leg she describes it as "terrible.  She is been at her job for 24 years trying to make it till next June.  No associated bowel or bladder symptoms.  She went to emergency department 4 weeks ago when she had increased pain.  MRI showed multilevel degenerative changes.  Review of Systems   Objective: Vital Signs: BP 127/77   Pulse 66   Ht '5\' 9"'$  (1.753 m)   Wt 292 lb (132.5 kg)   BMI 43.12 kg/m   Physical Exam Constitutional:      Appearance: She is well-developed.  HENT:     Head: Normocephalic.     Right Ear: External ear normal.     Left Ear: External ear normal. There is no impacted cerumen.  Eyes:     Pupils: Pupils are equal, round, and reactive to light.  Neck:      Thyroid: No thyromegaly.     Trachea: No tracheal deviation.  Cardiovascular:     Rate and Rhythm: Normal rate.  Pulmonary:     Effort: Pulmonary effort is normal.  Abdominal:     Palpations: Abdomen is soft.  Musculoskeletal:     Cervical back: No rigidity.  Skin:    General: Skin is warm and dry.  Neurological:     Mental Status: She is alert and oriented to person, place, and time.  Psychiatric:        Behavior: Behavior normal.    Ortho Exam patient is ambulatory.  Specialty Comments:  No specialty comments available.  Imaging: IMPRESSION:  Mild-to-moderate degenerative changes of the lumbar spine.   Electronically Signed by: Linden Dolin on 04/05/2021 12:25 PM  Narrative  MRI lumbar spine:   INDICATION: Worker's Compensation. Back pain.   TECHNIQUE: Sagittal and axial T1 and T2-weighted sequences were performed. Additional sagittal STIR images were performed.   COMPARISON: None available.   FINDINGS:  There are 5 nonrib-bearing lumbar-type vertebrae. The conus medullaris terminates at a normal level. Hemangiomas within the L1 and L3 vertebral  bodies. The vertebral body heights are maintained. Grade 1 anterolisthesis of L4 on L5. Mild multilevel intervertebral disc height loss. No suspicious marrow signal.   L1-2: No significant spinal canal or neural foraminal stenosis.  L2-3: Disc bulge contributes to mild spinal canal stenosis and along with facet arthrosis, moderate left and mild right neural foraminal stenosis.  L3-4: Disc bulge contributes to mild spinal canal stenosis along with facet arthrosis contributes to mild left and mild/moderate right neural foraminal stenosis.  L4-5: Disc bulge contributes to mild spinal canal stenosis and along with facet arthrosis contributes to mild/moderate bilateral neural foraminal stenosis.  L5-S1: Disc bulge and facet arthrosis contributes to mild/moderate bilateralneural foraminal stenosis. No significant spinal canal  stenosis.  Procedure Note  Linden Dolin, MD - 04/05/2021  Formatting of this note might be different from the original.  MRI lumbar spine:   INDICATION: Worker's Compensation. Back pain.   TECHNIQUE: Sagittal and axial T1 and T2-weighted sequences were performed. Additional sagittal STIR images were performed.   COMPARISON: None available.   FINDINGS:  There are 5 nonrib-bearing lumbar-type vertebrae. The conus medullaris terminates at a normal level. Hemangiomas within the L1 and L3 vertebral bodies. The vertebral body heights are maintained. Grade 1 anterolisthesis of L4 on L5. Mild multilevel intervertebral disc height loss. No suspicious marrow signal.   L1-2: No significant spinal canal or neural foraminal stenosis.  L2-3: Disc bulge contributes to mild spinal canal stenosis and along with facet arthrosis, moderate left and mild right neural foraminal stenosis.  L3-4: Disc bulge contributes to mild spinal canal stenosis along with facet arthrosis contributes to mild left and mild/moderate right neural foraminal stenosis.  L4-5: Disc bulge contributes to mild spinal canal stenosis and along with facet arthrosis contributes to mild/moderate bilateral neural foraminal stenosis.  L5-S1: Disc bulge and facet arthrosis contributes to mild/moderate bilateral neural foraminal stenosis. No significant spinal canal stenosis.    IMPRESSION:  Mild-to-moderate degenerative changes of the lumbar spine.   Electronically Signed by: Linden Dolin on 04/05/2021 12:25 PM   PMFS History: Patient Active Problem List   Diagnosis Date Noted   Protrusion of lumbar intervertebral disc 01/12/2020   Low back pain 05/11/2019   Morbid (severe) obesity due to excess calories ([employer]) 11/25/2016   Shortness of breath 06/13/2016   Atypical chest pain 06/13/2016   Anemia of chronic disease 03/08/2015   High blood pressure 07/22/2012   Diabetes mellitus ([place]) 07/22/2012   Past Medical History:   Diagnosis Date   Anemia    Atypical chest pain 06/13/2016   Back pain    Breast nodule 2009   Right    Diabetes mellitus    Fibroid    Symptomatic   H/O dysmenorrhea    H/O hematuria    H/O hypercholesterolemia    H/O: menorrhagia    H/O: obesity    HPV in female    Hypertension    Microhematuria 2012   Shortness of breath 06/13/2016   Yeast vaginitis 2008    Family History  Problem Relation Age of Onset   Diabetes Mother    Valvular heart disease Mother    Diabetes Brother    Hypertension Father    Stroke Father     Past Surgical History:  Procedure Laterality Date   ABDOMINAL HYSTERECTOMY     HYSTEROSCOPY WITH D & C  2003   POLYPECTOMY  2008   TUBAL LIGATION  1984   Bilateral   Social History   Occupational History   Occupation:  Glass blower/designer  Tobacco Use   Smoking status: Never   Smokeless tobacco: Never  Vaping Use   Vaping Use: Never used  Substance and Sexual Activity   Alcohol use: No   Drug use: No   Sexual activity: Never    Comment: hysterectomy

## 2021-04-18 ENCOUNTER — Encounter: Payer: Self-pay | Admitting: Orthopaedic Surgery

## 2021-04-18 ENCOUNTER — Ambulatory Visit (INDEPENDENT_AMBULATORY_CARE_PROVIDER_SITE_OTHER): Payer: Worker's Compensation | Admitting: Orthopaedic Surgery

## 2021-04-18 ENCOUNTER — Other Ambulatory Visit: Payer: Self-pay

## 2021-04-18 VITALS — BP 150/81 | HR 65 | Ht 69.0 in | Wt 292.0 lb

## 2021-04-18 DIAGNOSIS — M5126 Other intervertebral disc displacement, lumbar region: Secondary | ICD-10-CM

## 2021-04-18 NOTE — Progress Notes (Signed)
Office Visit Note   Patient: Erin [name]           Date of Birth: 1960/10/24           MRN: [id number] Visit Date: 04/18/2021              Requested by: Glenis Smoker, MD [place],  [place] [id number] PCP: Glenis Smoker, MD   Assessment & Plan: Visit Diagnoses:  1. Protrusion of lumbar intervertebral disc     Plan: Would recommend proceeding with some physical therapy.  Work slip given no work x4 weeks.  Recheck 4 weeks.  She has gone to [facility] [place] [facility] in the past [facility] with good results.  Follow-Up Instructions: No follow-ups on file.   Orders:  No orders of the defined types were placed in this encounter.  No orders of the defined types were placed in this encounter.     Procedures: No procedures performed   Clinical Data: No additional findings.   Subjective: Chief Complaint  Patient presents with   Lower Back - Follow-up    HPI 60 year old female returns states she is having severe back pain problems and is barely able to make it through work.  She has had intermittent problems an MRI scan disc was brought back by patient for review with scan date 04/05/2021.  This shows disc protrusions L2-3, L3-4.  Grade 1 anterolisthesis L4-5 mild disc protrusion and moderate foraminal stenosis L5-S1.  She has no areas of severe compression.  Continue problems with morbid obesity BMI 43.  Positive for diabetes.  Patient lives in [place].  For right leg symptoms and left.  She has mild right knee valgus.  Patient eventually had Worker's Comp. injury with ongoing problems with back pain.  Date of injury is her 2016 also 2019. Patient is [facility]. Review of Systems all other systems noncontributory to HPI.   Objective: Vital Signs: BP (!) 150/81   Pulse 65   Ht '5\' 9"'$  (1.753 m)   Wt 292 lb (132.5 kg)   BMI 43.12 kg/m   Physical Exam Constitutional:      Appearance: She is  well-developed.  HENT:     Head: Normocephalic.     Right Ear: External ear normal.     Left Ear: External ear normal. There is no impacted cerumen.  Eyes:     Pupils: Pupils are equal, round, and reactive to light.  Neck:     Thyroid: No thyromegaly.     Trachea: No tracheal deviation.  Cardiovascular:     Rate and Rhythm: Normal rate.  Pulmonary:     Effort: Pulmonary effort is normal.  Abdominal:     Palpations: Abdomen is soft.  Musculoskeletal:     Cervical back: No rigidity.  Skin:    General: Skin is warm and dry.  Neurological:     Mental Status: She is alert and oriented to person, place, and time.  Psychiatric:        Behavior: Behavior normal.    Ortho Exam slow antalgic gait.  Right knee valgus.  Negative logroll hips.  She has some difficulty getting from sitting to standing.  Specialty Comments:  No specialty comments available.  Imaging: IMPRESSION:  Mild-to-moderate degenerative changes of the lumbar spine.   Electronically Signed by: Linden Dolin on 04/05/2021 12:25 PM  Narrative  MRI lumbar spine:   INDICATION: Worker's Compensation. Back pain.   TECHNIQUE:  Sagittal and axial T1 and T2-weighted sequences were performed. Additional sagittal STIR images were performed.   COMPARISON: None available.   FINDINGS:  There are 5 nonrib-bearing lumbar-type vertebrae. The conus medullaris terminates at a normal level. Hemangiomas within the L1 and L3 vertebral bodies. The vertebral body heights are maintained. Grade 1 anterolisthesis of L4 on L5. Mild multilevel intervertebral disc height loss. No suspicious marrow signal.   L1-2: No significant spinal canal or neural foraminal stenosis.  L2-3: Disc bulge contributes to mild spinal canal stenosis and along with facet arthrosis, moderate left and mild right neural foraminal stenosis.  L3-4: Disc bulge contributes to mild spinal canal stenosis along with facet arthrosis contributes to mild left and  mild/moderate right neural foraminal stenosis.  L4-5: Disc bulge contributes to mild spinal canal stenosis and along with facet arthrosis contributes to mild/moderate bilateral neural foraminal stenosis.  L5-S1: Disc bulge and facet arthrosis contributes to mild/moderate bilateralneural foraminal stenosis. No significant spinal canal stenosis.  Procedure Note  Linden Dolin, MD - 04/05/2021  Formatting of this note might be different from the original.  MRI lumbar spine:   INDICATION: Worker's Compensation. Back pain.    PMFS History: Patient Active Problem List   Diagnosis Date Noted   Protrusion of lumbar intervertebral disc 01/12/2020   Low back pain 05/11/2019   Morbid (severe) obesity due to excess calories ([place]) 11/25/2016   Shortness of breath 06/13/2016   Atypical chest pain 06/13/2016   Anemia of chronic disease 03/08/2015   High blood pressure 07/22/2012   Diabetes mellitus ([place]) 07/22/2012   Past Medical History:  Diagnosis Date   Anemia    Atypical chest pain 06/13/2016   Back pain    Breast nodule 2009   Right    Diabetes mellitus    Fibroid    Symptomatic   H/O dysmenorrhea    H/O hematuria    H/O hypercholesterolemia    H/O: menorrhagia    H/O: obesity    HPV in female    Hypertension    Microhematuria 2012   Shortness of breath 06/13/2016   Yeast vaginitis 2008    Family History  Problem Relation Age of Onset   Diabetes Mother    Valvular heart disease Mother    Diabetes Brother    Hypertension Father    Stroke Father     Past Surgical History:  Procedure Laterality Date   ABDOMINAL HYSTERECTOMY     HYSTEROSCOPY WITH D & C  2003   POLYPECTOMY  2008   TUBAL LIGATION  1984   Bilateral   Social History   Occupational History   Occupation: Glass blower/designer  Tobacco Use   Smoking status: Never   Smokeless tobacco: Never  Vaping Use   Vaping Use: Never used  Substance and Sexual Activity   Alcohol use: No   Drug use: No   Sexual  activity: Never    Comment: hysterectomy

## 2021-04-18 NOTE — Addendum Note (Signed)
Addended by: Elvin So L on: 04/18/2021 10:14 AM   Modules accepted: Orders

## 2021-04-23 ENCOUNTER — Encounter (INDEPENDENT_AMBULATORY_CARE_PROVIDER_SITE_OTHER): Payer: Self-pay

## 2021-05-01 ENCOUNTER — Ambulatory Visit (INDEPENDENT_AMBULATORY_CARE_PROVIDER_SITE_OTHER): Payer: 59 | Admitting: Physician Assistant

## 2021-05-03 ENCOUNTER — Other Ambulatory Visit (INDEPENDENT_AMBULATORY_CARE_PROVIDER_SITE_OTHER): Payer: Self-pay | Admitting: Physician Assistant

## 2021-05-03 DIAGNOSIS — E559 Vitamin D deficiency, unspecified: Secondary | ICD-10-CM

## 2021-05-15 ENCOUNTER — Other Ambulatory Visit: Payer: Self-pay

## 2021-05-15 ENCOUNTER — Ambulatory Visit (INDEPENDENT_AMBULATORY_CARE_PROVIDER_SITE_OTHER): Payer: 59 | Admitting: Physician Assistant

## 2021-05-15 VITALS — BP 118/77 | HR 72 | Temp 98.5°F | Ht 68.0 in | Wt 295.0 lb

## 2021-05-15 DIAGNOSIS — I10 Essential (primary) hypertension: Secondary | ICD-10-CM

## 2021-05-15 DIAGNOSIS — E119 Type 2 diabetes mellitus without complications: Secondary | ICD-10-CM

## 2021-05-15 DIAGNOSIS — E559 Vitamin D deficiency, unspecified: Secondary | ICD-10-CM | POA: Diagnosis not present

## 2021-05-15 DIAGNOSIS — Z9189 Other specified personal risk factors, not elsewhere classified: Secondary | ICD-10-CM | POA: Diagnosis not present

## 2021-05-15 DIAGNOSIS — Z6841 Body Mass Index (BMI) 40.0 and over, adult: Secondary | ICD-10-CM

## 2021-05-15 MED ORDER — RYBELSUS 14 MG PO TABS: 14.0000 mg | ORAL_TABLET | Freq: Every day | ORAL | 0 refills | Status: DC

## 2021-05-15 MED ORDER — VITAMIN D (ERGOCALCIFEROL) 1.25 MG (50000 UNIT) PO CAPS: 50000.0000 [IU] | ORAL_CAPSULE | ORAL | 0 refills | Status: DC

## 2021-05-15 NOTE — Progress Notes (Signed)
Chief Complaint:   OBESITY Erin [name] is here to discuss her progress with her obesity treatment plan along with follow-up of her obesity related diagnoses. Erin [name] is on the Category 3 Plan and states she is following her eating plan approximately 60% of the time. Erin [name] states she is doing 0 minutes 0 times per week.  Today's visit was #: 43 Starting weight: 330 lbs Starting date: 07/16/2018 Today's weight: 295 lbs Today's date: 05/15/2021 Total lbs lost to date: 35 Total lbs lost since last in-office visit: 0  Interim History: Erin [name] has been out of work for the last 3 weeks due to back pain and her ortho writing her out of work. She was on the plan for 2 weeks and then got off the plan after that.  Subjective:   1. Diabetes mellitus with coincident hypertension (Erin [name]) Erin [name] is on Rybelsus and metformin, and she denies nausea, vomiting, or diarrhea.  2. Vitamin D deficiency Erin [name] is on Vit D, and she denies nausea, vomiting, or muscle weakness.  3. At risk for heart disease Erin [name] is at a higher than average risk for cardiovascular disease due to obesity.   Assessment/Plan:   1. Diabetes mellitus with coincident hypertension (Erin [name]) Erin [name] will continue with Rybelsus and we will refill for 1 month. Good blood sugar control is important to decrease the likelihood of diabetic complications such as nephropathy, neuropathy, limb loss, blindness, coronary artery disease, and death. Intensive lifestyle modification including diet, exercise and weight loss are the first line of treatment for diabetes.   - Semaglutide (RYBELSUS) 14 MG TABS; Take 14 mg by mouth daily.  Dispense: 30 tablet; Refill: 0  2. Vitamin D deficiency Low Vitamin D level contributes to fatigue and are associated with obesity, breast, and colon cancer. We will refill prescription Vitamin D for 1 month. Erin [name] will follow-up for routine testing of Vitamin D, at least 2-3 times per year to avoid over-replacement.  -  Vitamin D, Ergocalciferol, (DRISDOL) 1.25 MG (50000 UNIT) CAPS capsule; Take 1 capsule (50,000 Units total) by mouth every 7 (seven) days.  Dispense: 4 capsule; Refill: 0  3. At risk for heart disease Erin [name] was given approximately 15 minutes of coronary artery disease prevention counseling today. She is 60 y.o. female and has risk factors for heart disease including obesity. We discussed intensive lifestyle modifications today with an emphasis on specific weight loss instructions and strategies.   Repetitive spaced learning was employed today to elicit superior memory formation and behavioral change.  4. Class 3 severe obesity with serious comorbidity and body mass index (BMI) of 45.0 to 49.9 in adult, unspecified obesity type (Erin [name]); current BMI 44.86 Erin [name] is currently in the action stage of change. As such, her goal is to continue with weight loss efforts. She has agreed to the Category 3 Plan.   Exercise goals: No exercise has been prescribed at this time.  Behavioral modification strategies: no skipping meals and meal planning and cooking strategies.  Erin [name] has agreed to follow-up with our clinic in 3 weeks. She was informed of the importance of frequent follow-up visits to maximize her success with intensive lifestyle modifications for her multiple health conditions.   Objective:   Blood pressure 118/77, pulse 72, temperature 98.5 F (36.9 C), height '5\' 8"'$  (1.727 m), weight 295 lb (133.8 kg), SpO2 99 %. Body mass index is 44.85 kg/m.  General: Cooperative, alert, well developed, in no acute distress. HEENT: Conjunctivae and lids unremarkable. Cardiovascular: Regular rhythm.  Lungs: Normal work  of breathing. Neurologic: No focal deficits.   Lab Results  Component Value Date   CREATININE 0.57 01/11/2021   BUN 10 01/11/2021   NA 141 01/11/2021   K 4.2 01/11/2021   CL 102 01/11/2021   CO2 26 01/11/2021   Lab Results  Component Value Date   ALT 15 01/11/2021   AST 16  01/11/2021   ALKPHOS 85 01/11/2021   BILITOT 0.3 01/11/2021   Lab Results  Component Value Date   HGBA1C 6.6 (H) 01/11/2021   HGBA1C 7.1 (H) 09/19/2020   HGBA1C 7.2 (H) 05/30/2020   HGBA1C 7.5 12/09/2019   HGBA1C 7.4 (H) 08/10/2019   Lab Results  Component Value Date   INSULIN 12.5 01/11/2021   INSULIN 7.6 09/19/2020   INSULIN 17.3 05/30/2020   INSULIN 22.7 08/10/2019   INSULIN 16.3 04/14/2019   Lab Results  Component Value Date   TSH 3.690 07/16/2018   Lab Results  Component Value Date   CHOL 162 01/11/2021   HDL 50 01/11/2021   LDLCALC 96 01/11/2021   TRIG 82 01/11/2021   CHOLHDL 3.2 01/11/2021   Lab Results  Component Value Date   VD25OH 47.7 01/11/2021   VD25OH 46.6 09/19/2020   VD25OH 39.5 05/30/2020   Lab Results  Component Value Date   WBC 7.4 12/09/2019   HGB 11.0 (A) 12/09/2019   HCT 35 (A) 12/09/2019   MCV 79 07/16/2018   PLT 339 12/09/2019   Lab Results  Component Value Date   IRON 38 (L) 12/20/2016   TIBC 244 12/20/2016   FERRITIN 590 (H) 12/20/2016   Attestation Statements:   Reviewed by clinician on day of visit: allergies, medications, problem list, medical history, surgical history, family history, social history, and previous encounter notes.   Wilhemena Durie, am acting as transcriptionist for [employer], PA-C.  I have reviewed the above documentation for accuracy and completeness, and I agree with the above. Abby Potash, PA-C

## 2021-05-22 ENCOUNTER — Ambulatory Visit (INDEPENDENT_AMBULATORY_CARE_PROVIDER_SITE_OTHER): Payer: Worker's Compensation | Admitting: Orthopaedic Surgery

## 2021-05-22 ENCOUNTER — Other Ambulatory Visit: Payer: Self-pay

## 2021-05-22 ENCOUNTER — Encounter: Payer: Self-pay | Admitting: Orthopaedic Surgery

## 2021-05-22 VITALS — BP 119/76 | Ht 68.0 in | Wt 295.0 lb

## 2021-05-22 DIAGNOSIS — M5126 Other intervertebral disc displacement, lumbar region: Secondary | ICD-10-CM | POA: Diagnosis not present

## 2021-05-22 DIAGNOSIS — M545 Low back pain, unspecified: Secondary | ICD-10-CM | POA: Diagnosis not present

## 2021-05-22 NOTE — Progress Notes (Signed)
Office Visit Note   Patient: Erin [name]           Date of Birth: 14-Aug-1961           MRN: [id number] Visit Date: 05/22/2021              Requested by: Glenis Smoker, MD [place],  [place] [address] PCP: Glenis Smoker, MD   Assessment & Plan: Visit Diagnoses:  1. Protrusion of lumbar intervertebral disc   2. Low back pain, unspecified back pain laterality, unspecified chronicity, unspecified whether sciatica present     Plan: Work slip given no work x4 weeks.  We will resubmit physical therapy request to Worker's Comp.  Office follow-up after therapy in 3 weeks.  Follow-Up Instructions: Return in about 3 weeks (around 06/12/2021).   Orders:  No orders of the defined types were placed in this encounter.  No orders of the defined types were placed in this encounter.     Procedures: No procedures performed   Clinical Data: No additional findings.   Subjective: Chief Complaint  Patient presents with   Lower Back - Follow-up    HPI 60 year old female returns for 4-week follow-up.  She has been out of work physical therapy was ordered and she states she has not heard about the therapy from [employer] carrier.  Patient states she still having difficulty walking problems with bending and twisting.  Difficulty sitting and continues to complain of back spasms.  She is on Advil and also gabapentin.  No chills or fever.  Patient does have diabetes.  Previous MRI 04/05/2021 showed disc bulge with mild stenosis at L3-4, L4-5 and moderate biforaminal stenosis at L5-S1 with small disc bulge.  Review of Systems all the systems updated unchanged from 04/18/2021 office visit.   Objective: Vital Signs: BP 119/76   Ht '5\' 8"'$  (1.727 m)   Wt 295 lb (133.8 kg)   BMI 44.85 kg/m   Physical Exam Constitutional:      Appearance: She is well-developed.  HENT:     Head: Normocephalic.     Right Ear: External ear normal.     Left Ear: External  ear normal. There is no impacted cerumen.  Eyes:     Pupils: Pupils are equal, round, and reactive to light.  Neck:     Thyroid: No thyromegaly.     Trachea: No tracheal deviation.  Cardiovascular:     Rate and Rhythm: Normal rate.  Pulmonary:     Effort: Pulmonary effort is normal.  Abdominal:     Palpations: Abdomen is soft.  Musculoskeletal:     Cervical back: No rigidity.  Skin:    General: Skin is warm and dry.  Neurological:     Mental Status: She is alert and oriented to person, place, and time.  Psychiatric:        Behavior: Behavior normal.    Ortho Exam patient slow getting sitting standing using her arms help get upright.  Mild right knee valgus.  Negative logroll the hips some pain with straight leg raising 80 degrees.  She has sciatic notch tenderness tenderness over the paralumbar muscles right and left symmetrically.  Distal pulses intact negative [place].  Good quad strength right and left.  Specialty Comments:  No specialty comments available.  Imaging: No results found.   PMFS History: Patient Active Problem List   Diagnosis Date Noted   Protrusion of lumbar intervertebral disc 01/12/2020   Low back pain 05/11/2019  Morbid (severe) obesity due to excess calories ([place]) 11/25/2016   Shortness of breath 06/13/2016   Atypical chest pain 06/13/2016   Anemia of chronic disease 03/08/2015   High blood pressure 07/22/2012   Diabetes mellitus ([place]) 07/22/2012   Past Medical History:  Diagnosis Date   Anemia    Atypical chest pain 06/13/2016   Back pain    Breast nodule 2009   Right    Diabetes mellitus    Fibroid    Symptomatic   H/O dysmenorrhea    H/O hematuria    H/O hypercholesterolemia    H/O: menorrhagia    H/O: obesity    HPV in female    Hypertension    Microhematuria 2012   Shortness of breath 06/13/2016   Yeast vaginitis 2008    Family History  Problem Relation Age of Onset   Diabetes Mother    Valvular heart disease Mother     Diabetes Brother    Hypertension Father    Stroke Father     Past Surgical History:  Procedure Laterality Date   ABDOMINAL HYSTERECTOMY     HYSTEROSCOPY WITH D & C  2003   POLYPECTOMY  2008   TUBAL LIGATION  1984   Bilateral   Social History   Occupational History   Occupation: Glass blower/designer  Tobacco Use   Smoking status: Never   Smokeless tobacco: Never  Vaping Use   Vaping Use: Never used  Substance and Sexual Activity   Alcohol use: No   Drug use: No   Sexual activity: Never    Comment: hysterectomy

## 2021-06-05 ENCOUNTER — Ambulatory Visit (INDEPENDENT_AMBULATORY_CARE_PROVIDER_SITE_OTHER): Payer: 59 | Admitting: Physician Assistant

## 2021-06-12 ENCOUNTER — Encounter: Payer: Self-pay | Admitting: Orthopaedic Surgery

## 2021-06-12 ENCOUNTER — Other Ambulatory Visit: Payer: Self-pay

## 2021-06-12 ENCOUNTER — Ambulatory Visit (INDEPENDENT_AMBULATORY_CARE_PROVIDER_SITE_OTHER): Payer: 59 | Admitting: Orthopaedic Surgery

## 2021-06-12 VITALS — BP 151/88 | Ht 68.0 in | Wt 295.0 lb

## 2021-06-12 DIAGNOSIS — M5126 Other intervertebral disc displacement, lumbar region: Secondary | ICD-10-CM | POA: Diagnosis not present

## 2021-06-12 DIAGNOSIS — M545 Low back pain, unspecified: Secondary | ICD-10-CM

## 2021-06-12 NOTE — Progress Notes (Signed)
Office Visit Note   Patient: Erin [name]           Date of Birth: Sep 30, 1960           MRN: [id number] Visit Date: 06/12/2021              Requested by: Glenis Smoker, MD [place],  [place] [id number] PCP: Glenis Smoker, MD   Assessment & Plan: Visit Diagnoses:  1. Protrusion of lumbar intervertebral disc   2. Low back pain, unspecified back pain laterality, unspecified chronicity, unspecified whether sciatica present   3. Morbid (severe) obesity due to excess calories ([place])     Plan: Work slip given no work x1 month.  She has not had therapy as ordered.  With this note I again request physical therapy to help with her back problems.  She is about 7 months from retirement.  Recheck 1 month.  Follow-Up Instructions: No follow-ups on file.   Orders:  No orders of the defined types were placed in this encounter.  No orders of the defined types were placed in this encounter.     Procedures: No procedures performed   Clinical Data: No additional findings.   Subjective: Chief Complaint  Patient presents with   Lower Back - Follow-up    HPI 60 year old female returns for follow-up continued problems with low back pain with previous on-the-job injury.  She works at a [employer].  Rolls that she has had to lift at work had increased in size and increased in weight.  Previous injury was 12/21/2008.  She has had MRI scans which showed disc protrusions at L2-3, L3-4, L4-5 and L5-S1.  She has been going to the weight and wellness and has lost more than 35 pounds and is followed at weight and wellness.  We ordered physical therapy several months ago and  Review of Systems no chills or fever no bowel bladder symptoms.   Objective: Vital Signs: BP (!) 151/88   Ht 5\' 8"  (1.727 m)   Wt 295 lb (133.8 kg)   BMI 44.85 kg/m   Physical Exam Constitutional:      Appearance: She is well-developed.  HENT:     Head: Normocephalic.     Right  Ear: External ear normal.     Left Ear: External ear normal. There is no impacted cerumen.  Eyes:     Pupils: Pupils are equal, round, and reactive to light.  Neck:     Thyroid: No thyromegaly.     Trachea: No tracheal deviation.  Cardiovascular:     Rate and Rhythm: Normal rate.  Pulmonary:     Effort: Pulmonary effort is normal.  Abdominal:     Palpations: Abdomen is soft.  Musculoskeletal:     Cervical back: No rigidity.  Skin:    General: Skin is warm and dry.  Neurological:     Mental Status: She is alert and oriented to person, place, and time.  Psychiatric:        Behavior: Behavior normal.    Ortho Exam patient slow to get from sitting to standing.  She has some pain with straight leg raising anterior tib EHL is active.  Increased discomfort with bending turning and twisting.  Specialty Comments:  No specialty comments available.  Imaging: FINDINGS:  There are 5 nonrib-bearing lumbar-type vertebrae. The conus medullaris terminates at a normal level. Hemangiomas within the L1 and L3 vertebral bodies. The vertebral body heights are maintained. Grade 1 anterolisthesis of  L4 on L5. Mild multilevel intervertebral disc height loss. No suspicious marrow signal.   L1-2: No significant spinal canal or neural foraminal stenosis.  L2-3: Disc bulge contributes to mild spinal canal stenosis and along with facet arthrosis, moderate left and mild right neural foraminal stenosis.  L3-4: Disc bulge contributes to mild spinal canal stenosis along with facet arthrosis contributes to mild left and mild/moderate right neural foraminal stenosis.  L4-5: Disc bulge contributes to mild spinal canal stenosis and along with facet arthrosis contributes to mild/moderate bilateral neural foraminal stenosis.  L5-S1: Disc bulge and facet arthrosis contributes to mild/moderate bilateral neural foraminal stenosis. No significant spinal canal stenosis. Procedure Note  Linden Dolin, MD -  04/05/2021  Formatting of this note might be different from the original.  MRI lumbar spine:    PMFS History: Patient Active Problem List   Diagnosis Date Noted   Protrusion of lumbar intervertebral disc 01/12/2020   Low back pain 05/11/2019   Morbid (severe) obesity due to excess calories ([place]) 11/25/2016   Shortness of breath 06/13/2016   Atypical chest pain 06/13/2016   Anemia of chronic disease 03/08/2015   High blood pressure 07/22/2012   Diabetes mellitus ([place]) 07/22/2012   Past Medical History:  Diagnosis Date   Anemia    Atypical chest pain 06/13/2016   Back pain    Breast nodule 2009   Right    Diabetes mellitus    Fibroid    Symptomatic   H/O dysmenorrhea    H/O hematuria    H/O hypercholesterolemia    H/O: menorrhagia    H/O: obesity    HPV in female    Hypertension    Microhematuria 2012   Shortness of breath 06/13/2016   Yeast vaginitis 2008    Family History  Problem Relation Age of Onset   Diabetes Mother    Valvular heart disease Mother    Diabetes Brother    Hypertension Father    Stroke Father     Past Surgical History:  Procedure Laterality Date   ABDOMINAL HYSTERECTOMY     HYSTEROSCOPY WITH D & C  2003   POLYPECTOMY  2008   TUBAL LIGATION  1984   Bilateral   Social History   Occupational History   Occupation: Glass blower/designer  Tobacco Use   Smoking status: Never   Smokeless tobacco: Never  Vaping Use   Vaping Use: Never used  Substance and Sexual Activity   Alcohol use: No   Drug use: No   Sexual activity: Never    Comment: hysterectomy

## 2021-06-19 ENCOUNTER — Encounter (INDEPENDENT_AMBULATORY_CARE_PROVIDER_SITE_OTHER): Payer: Self-pay | Admitting: Physician Assistant

## 2021-06-19 ENCOUNTER — Ambulatory Visit (INDEPENDENT_AMBULATORY_CARE_PROVIDER_SITE_OTHER): Payer: 59 | Admitting: Physician Assistant

## 2021-06-19 ENCOUNTER — Other Ambulatory Visit: Payer: Self-pay

## 2021-06-19 VITALS — BP 123/85 | HR 60 | Temp 98.0°F | Ht 68.0 in | Wt 297.0 lb

## 2021-06-19 DIAGNOSIS — Z9189 Other specified personal risk factors, not elsewhere classified: Secondary | ICD-10-CM | POA: Diagnosis not present

## 2021-06-19 DIAGNOSIS — E559 Vitamin D deficiency, unspecified: Secondary | ICD-10-CM | POA: Diagnosis not present

## 2021-06-19 DIAGNOSIS — E1169 Type 2 diabetes mellitus with other specified complication: Secondary | ICD-10-CM | POA: Diagnosis not present

## 2021-06-19 DIAGNOSIS — I10 Essential (primary) hypertension: Secondary | ICD-10-CM

## 2021-06-19 DIAGNOSIS — E785 Hyperlipidemia, unspecified: Secondary | ICD-10-CM

## 2021-06-19 DIAGNOSIS — E119 Type 2 diabetes mellitus without complications: Secondary | ICD-10-CM

## 2021-06-19 DIAGNOSIS — Z6841 Body Mass Index (BMI) 40.0 and over, adult: Secondary | ICD-10-CM

## 2021-06-19 MED ORDER — VITAMIN D (ERGOCALCIFEROL) 1.25 MG (50000 UNIT) PO CAPS: 50000.0000 [IU] | ORAL_CAPSULE | ORAL | 0 refills | Status: DC

## 2021-06-19 MED ORDER — RYBELSUS 14 MG PO TABS: 14.0000 mg | ORAL_TABLET | Freq: Every day | ORAL | 0 refills | Status: DC

## 2021-06-19 NOTE — Progress Notes (Signed)
Chief Complaint:   OBESITY Erin [name] is here to discuss her progress with her obesity treatment plan along with follow-up of her obesity related diagnoses. Erin [name] is on the Category 3 Plan and states she is following her eating plan approximately 75% of the time. Erin [name] states she is not currently exercising.  Today's visit was #: 68 Starting weight: 330 lbs Starting date: 07/16/2018 Today's weight: 297 lbs Today's date: 06/19/2021 Total lbs lost to date: 33 Total lbs lost since last in-office visit: 0  Interim History: Erin [name] continues to have back pain and is not working. She is more sedentary than normal. She has been eating out more than normal due to being in [place] with her sister.  Subjective:   1. Hyperlipidemia associated with type 2 diabetes mellitus (Erin) [name] is on Crestor and tolerating it well.  2. Vitamin D deficiency Pt is on prescription Vit D and denies nausea, vomiting, and muscle weakness.  3. Diabetes mellitus with coincident hypertension ([facility]) Erin [name] is on Rybelsus and Metformin. Her last A1c was 6.6.  4. At risk for osteoporosis Erin [name] is at higher risk of osteopenia and osteoporosis due to Vitamin D deficiency.   Assessment/Plan:   1. Hyperlipidemia associated with type 2 diabetes mellitus ([place]) Cardiovascular risk and specific lipid/LDL goals reviewed.  We discussed several lifestyle modifications today and [place] will continue to work on diet, exercise and weight loss efforts. Orders and follow up as documented in patient record. Continue with meds and meal plan.  Counseling Intensive lifestyle modifications are the first line treatment for this issue. Dietary changes: Increase soluble fiber. Decrease simple carbohydrates. Exercise changes: Moderate to vigorous-intensity aerobic activity 150 minutes per week if tolerated. Lipid-lowering medications: see documented in medical record. Check labs today.  - Lipid panel  2. Vitamin D  deficiency Low Vitamin D level contributes to fatigue and are associated with obesity, breast, and colon cancer. She agrees to continue to take prescription Vitamin D 50,000 IU every week and will follow-up for routine testing of Vitamin D, at least 2-3 times per year to avoid over-replacement. Check labs today.  Refill- Vitamin D, Ergocalciferol, (DRISDOL) 1.25 MG (50000 UNIT) CAPS capsule; Take 1 capsule (50,000 Units total) by mouth every 7 (seven) days.  Dispense: 4 capsule; Refill: 0  - VITAMIN D 25 Hydroxy (Vit-D Deficiency, Fractures)  3. Diabetes mellitus with coincident hypertension ([place]) Good blood sugar control is important to decrease the likelihood of diabetic complications such as nephropathy, neuropathy, limb loss, blindness, coronary artery disease, and death. Intensive lifestyle modification including diet, exercise and weight loss are the first line of treatment for diabetes.  Check labs today.  Refill- Semaglutide (RYBELSUS) 14 MG TABS; Take 14 mg by mouth daily.  Dispense: 30 tablet; Refill: 0  - Comprehensive metabolic panel - Hemoglobin A1c - Insulin, random  4. At risk for osteoporosis Erin [name] was given approximately 15 minutes of osteoporosis prevention counseling today. Erin [name] is at risk for osteopenia and osteoporosis due to her Vitamin D deficiency. She was encouraged to take her Vitamin D and follow her higher calcium diet and increase strengthening exercise to help strengthen her bones and decrease her risk of osteopenia and osteoporosis.  Repetitive spaced learning was employed today to elicit superior memory formation and behavioral change.  5. Class 3 severe obesity with serious comorbidity and body mass index (BMI) of 45.0 to 49.9 in adult, unspecified obesity type (Erin [name]); current BMI 44.86  Erin [name] is currently in the action stage of change. As  such, her goal is to continue with weight loss efforts. She has agreed to the Category 3 Plan.   Exercise goals:  As  is  Behavioral modification strategies: decreasing eating out and meal planning and cooking strategies.  Erin [name] has agreed to follow-up with our clinic in 2 weeks. She was informed of the importance of frequent follow-up visits to maximize her success with intensive lifestyle modifications for her multiple health conditions.   Erin [name] was informed we would discuss her lab results at her next visit unless there is a critical issue that needs to be addressed sooner. Erin [name] agreed to keep her next visit at the agreed upon time to discuss these results.  Objective:   Blood pressure 123/85, pulse 60, temperature 98 F (36.7 C), height 5\' 8"  (1.727 m), weight 297 lb (134.7 kg), SpO2 100 %. Body mass index is 45.16 kg/m.  General: Cooperative, alert, well developed, in no acute distress. HEENT: Conjunctivae and lids unremarkable. Cardiovascular: Regular rhythm.  Lungs: Normal work of breathing. Neurologic: No focal deficits.   Lab Results  Component Value Date   CREATININE 0.57 01/11/2021   BUN 10 01/11/2021   NA 141 01/11/2021   K 4.2 01/11/2021   CL 102 01/11/2021   CO2 26 01/11/2021   Lab Results  Component Value Date   ALT 15 01/11/2021   AST 16 01/11/2021   ALKPHOS 85 01/11/2021   BILITOT 0.3 01/11/2021   Lab Results  Component Value Date   HGBA1C 6.6 (H) 01/11/2021   HGBA1C 7.1 (H) 09/19/2020   HGBA1C 7.2 (H) 05/30/2020   HGBA1C 7.5 12/09/2019   HGBA1C 7.4 (H) 08/10/2019   Lab Results  Component Value Date   INSULIN 12.5 01/11/2021   INSULIN 7.6 09/19/2020   INSULIN 17.3 05/30/2020   INSULIN 22.7 08/10/2019   INSULIN 16.3 04/14/2019   Lab Results  Component Value Date   TSH 3.690 07/16/2018   Lab Results  Component Value Date   CHOL 162 01/11/2021   HDL 50 01/11/2021   LDLCALC 96 01/11/2021   TRIG 82 01/11/2021   CHOLHDL 3.2 01/11/2021   Lab Results  Component Value Date   VD25OH 47.7 01/11/2021   VD25OH 46.6 09/19/2020   VD25OH 39.5 05/30/2020    Lab Results  Component Value Date   WBC 7.4 12/09/2019   HGB 11.0 (A) 12/09/2019   HCT 35 (A) 12/09/2019   MCV 79 07/16/2018   PLT 339 12/09/2019   Lab Results  Component Value Date   IRON 38 (L) 12/20/2016   TIBC 244 12/20/2016   FERRITIN 590 (H) 12/20/2016    Attestation Statements:   Reviewed by clinician on day of visit: allergies, medications, problem list, medical history, surgical history, family history, social history, and previous encounter notes.  Coral Ceo, CMA, am acting as transcriptionist for [employer], PA-C.  I have reviewed the above documentation for accuracy and completeness, and I agree with the above. Abby Potash, PA-C

## 2021-06-20 LAB — COMPREHENSIVE METABOLIC PANEL
ALT: 11 IU/L (ref 0–32)
AST: 12 IU/L (ref 0–40)
Albumin/Globulin Ratio: 1.8 (ref 1.2–2.2)
Albumin: 4.3 g/dL (ref 3.8–4.9)
Alkaline Phosphatase: 80 IU/L (ref 44–121)
BUN/Creatinine Ratio: 13 (ref 12–28)
BUN: 7 mg/dL — ABNORMAL LOW (ref 8–27)
Bilirubin Total: 0.3 mg/dL (ref 0.0–1.2)
CO2: 24 mmol/L (ref 20–29)
Calcium: 9.1 mg/dL (ref 8.7–10.3)
Chloride: 101 mmol/L (ref 96–106)
Creatinine, Ser: 0.52 mg/dL — ABNORMAL LOW (ref 0.57–1.00)
Globulin, Total: 2.4 g/dL (ref 1.5–4.5)
Glucose: 90 mg/dL (ref 70–99)
Potassium: 4.7 mmol/L (ref 3.5–5.2)
Sodium: 141 mmol/L (ref 134–144)
Total Protein: 6.7 g/dL (ref 6.0–8.5)
eGFR: 106 mL/min/{1.73_m2} (ref >59)

## 2021-06-20 LAB — INSULIN, RANDOM: INSULIN: 13.9 u[IU]/mL (ref 2.6–24.9)

## 2021-06-20 LAB — HEMOGLOBIN A1C
Est. average glucose Bld gHb Est-mCnc: 148 mg/dL
Hgb A1c MFr Bld: 6.8 % — ABNORMAL HIGH (ref 4.8–5.6)

## 2021-06-20 LAB — VITAMIN D 25 HYDROXY (VIT D DEFICIENCY, FRACTURES): Vit D, 25-Hydroxy: 59 ng/mL (ref 30.0–100.0)

## 2021-06-20 LAB — LIPID PANEL
Chol/HDL Ratio: 3.4 ratio (ref 0.0–4.4)
Cholesterol, Total: 135 mg/dL (ref 100–199)
HDL: 40 mg/dL (ref >39)
LDL Chol Calc (NIH): 80 mg/dL (ref 0–99)
Triglycerides: 76 mg/dL (ref 0–149)
VLDL Cholesterol Cal: 15 mg/dL (ref 5–40)

## 2021-06-28 ENCOUNTER — Other Ambulatory Visit: Payer: Self-pay | Admitting: Cardiovascular Disease

## 2021-06-28 ENCOUNTER — Telehealth (HOSPITAL_BASED_OUTPATIENT_CLINIC_OR_DEPARTMENT_OTHER): Payer: Self-pay | Admitting: Cardiovascular Disease

## 2021-06-28 NOTE — Telephone Encounter (Signed)
Left message for patient to call and schedule 12 month follow up with Dr. Oval Linsey

## 2021-07-05 ENCOUNTER — Ambulatory Visit (INDEPENDENT_AMBULATORY_CARE_PROVIDER_SITE_OTHER): Payer: 59 | Admitting: Physician Assistant

## 2021-07-09 ENCOUNTER — Telehealth: Payer: Self-pay | Admitting: Orthopaedic Surgery

## 2021-07-09 NOTE — Telephone Encounter (Signed)
Pt submitted medical release form, short term disability, and $25.00 cash payment to [employer]. Accepted 07/09/21

## 2021-07-11 ENCOUNTER — Telehealth: Payer: Self-pay | Admitting: Orthopaedic Surgery

## 2021-07-11 NOTE — Telephone Encounter (Signed)
06/12/21 ov note faxed tp [facility] [phone]

## 2021-07-17 ENCOUNTER — Encounter (INDEPENDENT_AMBULATORY_CARE_PROVIDER_SITE_OTHER): Payer: Self-pay | Admitting: Physician Assistant

## 2021-07-17 ENCOUNTER — Ambulatory Visit (INDEPENDENT_AMBULATORY_CARE_PROVIDER_SITE_OTHER): Payer: 59 | Admitting: Physician Assistant

## 2021-07-17 ENCOUNTER — Other Ambulatory Visit: Payer: Self-pay

## 2021-07-17 VITALS — BP 108/68 | HR 63 | Temp 97.9°F | Ht 68.0 in | Wt 299.0 lb

## 2021-07-17 DIAGNOSIS — Z9189 Other specified personal risk factors, not elsewhere classified: Secondary | ICD-10-CM

## 2021-07-17 DIAGNOSIS — Z6841 Body Mass Index (BMI) 40.0 and over, adult: Secondary | ICD-10-CM

## 2021-07-17 DIAGNOSIS — I10 Essential (primary) hypertension: Secondary | ICD-10-CM

## 2021-07-17 DIAGNOSIS — E119 Type 2 diabetes mellitus without complications: Secondary | ICD-10-CM

## 2021-07-17 DIAGNOSIS — E559 Vitamin D deficiency, unspecified: Secondary | ICD-10-CM

## 2021-07-17 MED ORDER — RYBELSUS 14 MG PO TABS: 14.0000 mg | ORAL_TABLET | Freq: Every day | ORAL | 0 refills | Status: DC

## 2021-07-17 MED ORDER — VITAMIN D (ERGOCALCIFEROL) 1.25 MG (50000 UNIT) PO CAPS: 50000.0000 [IU] | ORAL_CAPSULE | ORAL | 0 refills | Status: DC

## 2021-07-17 NOTE — Progress Notes (Signed)
Chief Complaint:   OBESITY Erin [name] is here to discuss her progress with her obesity treatment plan along with follow-up of her obesity related diagnoses. Erin [name] is on the Category 3 Plan and states she is following her eating plan approximately 75% of the time. Erin [name] states she is doing 0 minutes 0 times per week.  Today's visit was #: 12 Starting weight: 330 lbs Starting date: 07/16/2018 Today's weight: 299 lbs Today's date: 07/17/2021 Total lbs lost to date: 31 Total lbs lost since last in-office visit: 0  Interim History: Deauna is not eating enough throughout the day. She is worried that if she eats all of the food on the plan, she is going to gain weight.  Subjective:   1. Diabetes mellitus with coincident hypertension ([place]) Erin [name] is on Rybelsus 14 mg daily. Her last A1c was 6.8, worsening from 6.6. her appetite is well controlled. I discussed labs with the patient today.  2. Vitamin D deficiency Erin [name]'s last Vit D level was 59.0, and it is at goal. She is taking Vit D once weekly. I discussed labs with the patient today.  3. At risk for impaired metabolic function Erin [name] is at increased risk for impaired metabolic function due to skipping meals.  Assessment/Plan:   1. Diabetes mellitus with coincident hypertension (Erin [name]) Erin [name] was discussed today, and Erin [name] wants to think about it. We will refill Rybelsus for 1 month. Good blood sugar control is important to decrease the likelihood of diabetic complications such as nephropathy, neuropathy, limb loss, blindness, coronary artery disease, and death. Intensive lifestyle modification including diet, exercise and weight loss are the first line of treatment for diabetes.   - Semaglutide (RYBELSUS) 14 MG TABS; Take 14 mg by mouth daily.  Dispense: 30 tablet; Refill: 0  2. Vitamin D deficiency Low Vitamin D level contributes to fatigue and are associated with obesity, breast, and colon cancer. We will refill prescription  Vitamin D for 1 month. Erin [name] will follow-up for routine testing of Vitamin D, at least 2-3 times per year to avoid over-replacement.  - Vitamin D, Ergocalciferol, (DRISDOL) 1.25 MG (50000 UNIT) CAPS capsule; Take 1 capsule (50,000 Units total) by mouth every 7 (seven) days.  Dispense: 4 capsule; Refill: 0  3. At risk for impaired metabolic function Erin [name] was given approximately 15 minutes of impaired  metabolic function prevention counseling today. We discussed intensive lifestyle modifications today with an emphasis on specific nutrition and exercise instructions and strategies.   Repetitive spaced learning was employed today to elicit superior memory formation and behavioral change.   4. Obesity, with current BMI of 45.47 Erin [name] is currently in the action stage of change. As such, her goal is to continue with weight loss efforts. She has agreed to the Category 3 Plan.   Exercise goals: No exercise has been prescribed at this time.  Behavioral modification strategies: meal planning and cooking strategies and keeping healthy foods in the home.  Erin [name] has agreed to follow-up with our clinic in 3 weeks. She was informed of the importance of frequent follow-up visits to maximize her success with intensive lifestyle modifications for her multiple health conditions.   Objective:   Blood pressure 108/68, pulse 63, temperature 97.9 F (36.6 C), height 5\' 8"  (1.727 m), weight 299 lb (135.6 kg), SpO2 100 %. Body mass index is 45.46 kg/m.  General: Cooperative, alert, well developed, in no acute distress. HEENT: Conjunctivae and lids unremarkable. Cardiovascular: Regular rhythm.  Lungs: Normal work of breathing. Neurologic: No focal  deficits.   Lab Results  Component Value Date   CREATININE 0.52 (L) 06/19/2021   BUN 7 (L) 06/19/2021   NA 141 06/19/2021   K 4.7 06/19/2021   CL 101 06/19/2021   CO2 24 06/19/2021   Lab Results  Component Value Date   ALT 11 06/19/2021   AST 12  06/19/2021   ALKPHOS 80 06/19/2021   BILITOT 0.3 06/19/2021   Lab Results  Component Value Date   HGBA1C 6.8 (H) 06/19/2021   HGBA1C 6.6 (H) 01/11/2021   HGBA1C 7.1 (H) 09/19/2020   HGBA1C 7.2 (H) 05/30/2020   HGBA1C 7.5 12/09/2019   Lab Results  Component Value Date   INSULIN 13.9 06/19/2021   INSULIN 12.5 01/11/2021   INSULIN 7.6 09/19/2020   INSULIN 17.3 05/30/2020   INSULIN 22.7 08/10/2019   Lab Results  Component Value Date   TSH 3.690 07/16/2018   Lab Results  Component Value Date   CHOL 135 06/19/2021   HDL 40 06/19/2021   LDLCALC 80 06/19/2021   TRIG 76 06/19/2021   CHOLHDL 3.4 06/19/2021   Lab Results  Component Value Date   VD25OH 59.0 06/19/2021   VD25OH 47.7 01/11/2021   VD25OH 46.6 09/19/2020   Lab Results  Component Value Date   WBC 7.4 12/09/2019   HGB 11.0 (A) 12/09/2019   HCT 35 (A) 12/09/2019   MCV 79 07/16/2018   PLT 339 12/09/2019   Lab Results  Component Value Date   IRON 38 (L) 12/20/2016   TIBC 244 12/20/2016   FERRITIN 590 (H) 12/20/2016   Attestation Statements:   Reviewed by clinician on day of visit: allergies, medications, problem list, medical history, surgical history, family history, social history, and previous encounter notes.   Wilhemena Durie, am acting as transcriptionist for [employer], PA-C.  I have reviewed the above documentation for accuracy and completeness, and I agree with the above. Abby Potash, PA-C

## 2021-07-18 ENCOUNTER — Ambulatory Visit (INDEPENDENT_AMBULATORY_CARE_PROVIDER_SITE_OTHER): Payer: Worker's Compensation | Admitting: Orthopaedic Surgery

## 2021-07-18 ENCOUNTER — Telehealth: Payer: Self-pay | Admitting: Orthopaedic Surgery

## 2021-07-18 ENCOUNTER — Encounter: Payer: Self-pay | Admitting: Orthopaedic Surgery

## 2021-07-18 VITALS — BP 128/74 | HR 64 | Ht 68.0 in | Wt 298.0 lb

## 2021-07-18 DIAGNOSIS — M5126 Other intervertebral disc displacement, lumbar region: Secondary | ICD-10-CM | POA: Diagnosis not present

## 2021-07-18 NOTE — Progress Notes (Signed)
Office Visit Note   Patient: Erin [name]           Date of Birth: [birth year]           MRN: [id number] Visit Date: 07/18/2021              Requested by: Glenis Smoker, MD [place],  [place] [id number] PCP: Glenis Smoker, MD   Assessment & Plan: Visit Diagnoses:  1. Protrusion of lumbar intervertebral disc     Plan: We will proceed with physical therapy as planned.  Work slip given no work x1 month recheck 1 month we can make a determination about work activity at that time.  Follow-Up Instructions: Return in about 1 month (around 08/17/2021).   Orders:  No orders of the defined types were placed in this encounter.  No orders of the defined types were placed in this encounter.     Procedures: No procedures performed   Clinical Data: No additional findings.   Subjective: Chief Complaint  Patient presents with   Lower Back - Follow-up    HPI patient returns states she has an attorney Worker's Comp. and not responded to emails or calls from her attorney.  She states no one will get back in touch with him and her attorney advised her to file it under her regular insurance.  She started physical therapy on Monday assessment was last week.  She is taking gabapentin.  She continues to have pain with activities and states its too significant at this point to resume work activities.  She is hoping that with therapy she will get significant improvement.  We had ordered physical therapy several months ago and there has been no delay in response Worker's Comp. carrier concerning physical therapy for several months.  Review of Systems all other systems updated noncontributory.  Of note is increased BMI, hypertension diabetes.   Objective: Vital Signs: BP 128/74   Pulse 64   Ht 5\' 8"  (1.727 m)   Wt 298 lb (135.2 kg)   BMI 45.31 kg/m   Physical Exam Constitutional:      Appearance: She is well-developed.  HENT:     Head: Normocephalic.      Right Ear: External ear normal.     Left Ear: External ear normal. There is no impacted cerumen.  Eyes:     Pupils: Pupils are equal, round, and reactive to light.  Neck:     Thyroid: No thyromegaly.     Trachea: No tracheal deviation.  Cardiovascular:     Rate and Rhythm: Normal rate.  Pulmonary:     Effort: Pulmonary effort is normal.  Abdominal:     Palpations: Abdomen is soft.  Musculoskeletal:     Cervical back: No rigidity.  Skin:    General: Skin is warm and dry.  Neurological:     Mental Status: She is alert and oriented to person, place, and time.  Psychiatric:        Behavior: Behavior normal.    Ortho Exam patient slow getting from sitting standing.  But anterior tib gastrocsoleus is intact.  Negative logroll of the hips.  Knees reach full extension.  Specialty Comments:  No specialty comments available.  Imaging: Impression  IMPRESSION:  Mild-to-moderate degenerative changes of the lumbar spine.   Electronically Signed by: Linden Dolin on 04/05/2021 12:25 PM Narrative  MRI lumbar spine:   INDICATION: Worker's Compensation. Back pain.   TECHNIQUE: Sagittal and axial T1 and T2-weighted sequences were  performed. Additional sagittal STIR images were performed.   COMPARISON: None available.   FINDINGS:  There are 5 nonrib-bearing lumbar-type vertebrae. The conus medullaris terminates at a normal level. Hemangiomas within the L1 and L3 vertebral bodies. The vertebral body heights are maintained. Grade 1 anterolisthesis of L4 on L5. Mild multilevel intervertebral disc height loss. No suspicious marrow signal.   L1-2: No significant spinal canal or neural foraminal stenosis.  L2-3: Disc bulge contributes to mild spinal canal stenosis and along with facet arthrosis, moderate left and mild right neural foraminal stenosis.  L3-4: Disc bulge contributes to mild spinal canal stenosis along with facet arthrosis contributes to mild left and mild/moderate right  neural foraminal stenosis.  L4-5: Disc bulge contributes to mild spinal canal stenosis and along with facet arthrosis contributes to mild/moderate bilateral neural foraminal stenosis.  L5-S1: Disc bulge and facet arthrosis contributes to mild/moderate bilateral neural foraminal stenosis. No significant spinal canal stenosis. Procedure Note  Linden Dolin, MD - 04/05/2021  Formatting of this note might be different from the original.  MRI lumbar spine:   INDICATION: Worker's Compensation. Back pain.   TECHNIQUE: Sagittal and axial T1 and T2-weighted sequences were performed. Additional sagittal STIR images were performed.   COMPARISON: None available.   FINDINGS:  There are 5 nonrib-bearing lumbar-type vertebrae. The conus medullaris terminates at a normal level. Hemangiomas within the L1 and L3 vertebral bodies. The vertebral body heights are maintained. Grade 1 anterolisthesis of L4 on L5. Mild multilevel intervertebral disc height loss. No suspicious marrow signal.   L1-2: No significant spinal canal or neural foraminal stenosis.  L2-3: Disc bulge contributes to mild spinal canal stenosis and along with facet arthrosis, moderate left and mild right neural foraminal stenosis.  L3-4: Disc bulge contributes to mild spinal canal stenosis along with facet arthrosis contributes to mild left and mild/moderate right neural foraminal stenosis.  L4-5: Disc bulge contributes to mild spinal canal stenosis and along with facet arthrosis contributes to mild/moderate bilateral neural foraminal stenosis.  L5-S1: Disc bulge and facet arthrosis contributes to mild/moderate bilateral neural foraminal stenosis. No significant spinal canal stenosis.    IMPRESSION:  Mild-to-moderate degenerative changes of the lumbar spine.   Electronically Signed by: Linden Dolin on 04/05/2021 12:25 PM   PMFS History: Patient Active Problem List   Diagnosis Date Noted   Protrusion of lumbar intervertebral  disc 01/12/2020   Low back pain 05/11/2019   Morbid (severe) obesity due to excess calories ([place]) 11/25/2016   Shortness of breath 06/13/2016   Atypical chest pain 06/13/2016   Anemia of chronic disease 03/08/2015   High blood pressure 07/22/2012   Diabetes mellitus ([place]) 07/22/2012   Past Medical History:  Diagnosis Date   Anemia    Atypical chest pain 06/13/2016   Back pain    Breast nodule 2009   Right    Diabetes mellitus    Fibroid    Symptomatic   H/O dysmenorrhea    H/O hematuria    H/O hypercholesterolemia    H/O: menorrhagia    H/O: obesity    HPV in female    Hypertension    Microhematuria 2012   Shortness of breath 06/13/2016   Yeast vaginitis 2008    Family History  Problem Relation Age of Onset   Diabetes Mother    Valvular heart disease Mother    Diabetes Brother    Hypertension Father    Stroke Father     Past Surgical History:  Procedure Laterality Date   ABDOMINAL  HYSTERECTOMY     HYSTEROSCOPY WITH D & C  2003   POLYPECTOMY  2008   TUBAL LIGATION  1984   Bilateral   Social History   Occupational History   Occupation: Glass blower/designer  Tobacco Use   Smoking status: Never   Smokeless tobacco: Never  Vaping Use   Vaping Use: Never used  Substance and Sexual Activity   Alcohol use: No   Drug use: No   Sexual activity: Never    Comment: hysterectomy

## 2021-07-18 NOTE — Telephone Encounter (Signed)
Pt called stating she had to get insurance papers filled out and when she came in she said crystal filled something out for her and had it faxed. However the paperwork was unreadable when it got back to [employer]. She's asking for a CB

## 2021-07-26 ENCOUNTER — Encounter (INDEPENDENT_AMBULATORY_CARE_PROVIDER_SITE_OTHER): Payer: Self-pay

## 2021-08-15 ENCOUNTER — Encounter: Payer: Self-pay | Admitting: Orthopaedic Surgery

## 2021-08-15 ENCOUNTER — Ambulatory Visit (INDEPENDENT_AMBULATORY_CARE_PROVIDER_SITE_OTHER): Payer: Worker's Compensation | Admitting: Orthopaedic Surgery

## 2021-08-15 ENCOUNTER — Other Ambulatory Visit: Payer: Self-pay

## 2021-08-15 ENCOUNTER — Ambulatory Visit (INDEPENDENT_AMBULATORY_CARE_PROVIDER_SITE_OTHER): Payer: 59 | Admitting: Physician Assistant

## 2021-08-15 VITALS — BP 123/76 | HR 76 | Ht 68.0 in | Wt 292.0 lb

## 2021-08-15 DIAGNOSIS — M5126 Other intervertebral disc displacement, lumbar region: Secondary | ICD-10-CM

## 2021-08-15 NOTE — Progress Notes (Signed)
Cardiology Office Note  Date:  08/16/2021   ID:  Erin [name], DOB 08/30/1961, MRN [id number]  PCP:  Glenis Smoker, MD  Cardiologist:   Skeet Latch, MD   No chief complaint on file.   History of Present Illness: Erin [name] is a 60 y.o. female with diabetes, hypertension, hyperlipidemia, venous insufficiency and morbid obesity who presents for follow up.  She was seen in clinic 06/13/16 for intermittent episodes of shortness of breath and chest discomfort.  The episodes occur randomly and not particularly with exertion. She was referred for an ETT 06/2016 that was negative for ischemia.  She achieved 7.6 METS on a Bruce protocol and was noted to have monomorphic PVCs during recovery.  She had lower extremity edema and was referred for an echo 11/07/16 that revealed LVEF 55-60% and was otherwise unremarkable.  She had a coronary CT-a 07/2028 that had a calcium score of 0 and no coronary artery disease.  She was started on metoprolol for PVCs.  At her last appointment she was doing well. Her walking was limited by right foot and left LE pain, but no chest pain or dyspnea. She was controlling her chronic LE edema with elevation and compression stockings. Today, she is doing pretty good overall aside from her chronic back pain. Every now and then she has a tightness in her chest. This mostly occurs after she eats her last meal of the day, which is typically her heaviest meal. The tightness lasts for a few minutes before it dissipates spontaneously. She is unsure if this is related to eating too quickly. Lately she is not formally exercising and more sedentary than usual. She has some LE edema that she attributes to sitting for most of the day. Since 04/2021 she has been out of work for medical leave, and is in PT twice a week for her back. Next year she plans to retire, and will work on increasing her exercise before then. She denies any palpitations, or shortness of breath. No  lightheadedness, headaches, syncope, orthopnea, PND, or exertional symptoms.  Past Medical History:  Diagnosis Date   Anemia    Atypical chest pain 06/13/2016   Back pain    Breast nodule 2009   Right    Diabetes mellitus    Fibroid    Symptomatic   GERD (gastroesophageal reflux disease) 08/16/2021   H/O dysmenorrhea    H/O hematuria    H/O hypercholesterolemia    H/O: menorrhagia    H/O: obesity    HPV in female    Hypertension    Lower extremity edema 08/16/2021   Microhematuria 2012   Pure hypercholesterolemia 08/16/2021   Shortness of breath 06/13/2016   Yeast vaginitis 2008    Past Surgical History:  Procedure Laterality Date   ABDOMINAL HYSTERECTOMY     HYSTEROSCOPY WITH D & C  2003   POLYPECTOMY  2008   TUBAL LIGATION  1984   Bilateral     Current Outpatient Medications  Medication Sig Dispense Refill   calcium acetate (PHOSLO) 667 MG capsule Take 667 mg by mouth 1 day or 1 dose.     gabapentin (NEURONTIN) 300 MG capsule Take 300 mg by mouth as needed.     ibuprofen (ADVIL,MOTRIN) 800 MG tablet TAKE 1 TABLET(S) BY MOUTH 2 TIMES A DAY AS NEEDED [PRN]  3   losartan (COZAAR) 50 MG tablet Take 50 mg by mouth daily.  5   metFORMIN (GLUCOPHAGE-XR) 500 MG 24 hr tablet Take 1,000  mg by mouth 2 (two) times daily.  5   metoprolol tartrate (LOPRESSOR) 25 MG tablet TAKE 1 TABLET BY MOUTH TWICE A DAY 180 tablet 1   Multiple Vitamin (MULTIVITAMIN) capsule Take 1 capsule by mouth daily.     rosuvastatin (CRESTOR) 10 MG tablet TAKE 1 TABLET (10 MG TOTAL) BY MOUTH DAILY. NEED OV. 90 tablet 1   Semaglutide (RYBELSUS) 14 MG TABS Take 14 mg by mouth daily. 30 tablet 0   vitamin C (ASCORBIC ACID) 500 MG tablet Take 500 mg by mouth daily.     Vitamin D, Ergocalciferol, (DRISDOL) 1.25 MG (50000 UNIT) CAPS capsule Take 1 capsule (50,000 Units total) by mouth every 7 (seven) days. 4 capsule 0   No current facility-administered medications for this visit.    Allergies:   Lisinopril and  Pioglitazone    Social History:  The patient  reports that she has never smoked. She has never used smokeless tobacco. She reports that she does not drink alcohol and does not use drugs.   Family History:  The patient's family history includes Diabetes in her brother and mother; Hypertension in her father; Stroke in her father; Valvular heart disease in her mother.   ROS:   Please see the history of present illness. (+) Chronic back pain. (+) Chest tightness All other systems are reviewed and negative.   PHYSICAL EXAM: VS:  BP 122/72 (BP Location: Left Arm, Patient Position: Sitting, Cuff Size: Large)   Pulse 66   Ht 5\' 8"  (1.727 m)   Wt (!) 309 lb 14.4 oz (140.6 kg)   BMI 47.12 kg/m  , BMI Body mass index is 47.12 kg/m. GENERAL:  Well appearing HEENT: Pupils equal round and reactive, fundi not visualized, oral mucosa unremarkable NECK:  No jugular venous distention, waveform within normal limits, carotid upstroke brisk and symmetric, no bruits, no thyromegaly LYMPHATICS:  No cervical adenopathy LUNGS:  Clear to auscultation bilaterally HEART:  RRR.  PMI not displaced or sustained,S1 and S2 within normal limits, no S3, no S4, no clicks, no rubs, II/VI systolic murmur at the LUSB ABD:  Flat, positive bowel sounds normal in frequency in pitch, no bruits, no rebound, no guarding, no midline pulsatile mass, no hepatomegaly, no splenomegaly EXT:  2 plus pulses throughout, 2+ bilateral LE edema to the lower tibia bilaterally, no cyanosis no clubbing SKIN:  No rashes no nodules NEURO:  Cranial nerves II through XII grossly intact, motor grossly intact throughout [facility]:  Cognitively intact, oriented to person place and time  EKG:   08/16/2021: Sinus rhythm. Rate 66 bpm. 05/03/2020: Sinus rhythm.  Rate 64 bpm.  Low voltage. 05/28/18:: Sinus rhythm.  Rate 70 bpm.  Low voltage.   05/16/16: Sinus rhythm. Rate 68 bpm. Voltage.  Left LE Venous DVT 07/20/2019 ([place]): FINDINGS: There is  flow within the saphenous vein and deep femoral vein. There is compression and flow of the common femoral vein, femoral vein, and popliteal vein. There is flow within the posterior tibial vein.   IMPRESSION: No evidence of DVT in the left lower extremity. Baker's cyst measuring 4 cm. Left inguinal adenopathy   Cardiac CT-A with Calcium Score 07/28/2018: FINDINGS: Non-cardiac: See separate report from [facility].   Pulmonary veins drain normally to the left atrium.   Calcium Score: 0 Agatston units.   Coronary Arteries: Right dominant with no anomalies   LM: No plaque or stenosis.   LAD system:  No plaque or stenosis.   Circumflex system: No plaque or  stenosis.   RCA system: No plaque or stenosis.   IMPRESSION: 1. Coronary artery calcium score 0 Agatston units, suggesting low risk for future cardiac events.   2.  No significant coronary disease noted.  Echo 11/07/16: Study Conclusions   - Left ventricle: The cavity size was mildly dilated. Systolic   function was normal. The estimated ejection fraction was in the   range of 55% to 60%. Wall motion was normal; there were no   regional wall motion abnormalities. Left ventricular diastolic   function parameters were normal. - Aortic valve: Trileaflet; mildly thickened, mildly calcified   leaflets. - Mitral valve: There was trivial regurgitation. - Left atrium: The atrium was mildly dilated. - Pulmonic valve: There was trivial regurgitation. - Pulmonary arteries: Systolic pressure could not be accurately   estimated.  ETT 06/21/16: Blood pressure demonstrated a normal response to exercise. There was no ST segment deviation noted during stress. No T wave inversion was noted during stress.   Normal ECG stress test. Frequent monomorphic PVCs are seen during recovery  Recent Labs: 06/19/2021: ALT 11; BUN 7; Creatinine, Ser 0.52; Potassium 4.7; Sodium 141   05/16/16: Sodium 138, potassium 3.9, BUN 10, creatinine  0.55 03/20/16: Hemoglobin A1c 7.3% WBC 8.2, hemoglobin 11.2, hematocrit 35.7, platelets 328 Total cholesterol 179, triglycerides 90, HDL 48, LDL 113  10/04/16: Sodium 139, potassium 4.1, BUN 12, creatinine 0.54 Total cholesterol 138, triglycerides 78, HDL 43, LDL 80 A1c 8.0%  05/29/2017: Total cholesterol 148, triglycerides 88, HDL 46, LDL 85.  12/09/2019: Hemoglobin A1c 7.5% Sodium 138, potassium 3.9, BUN 11, creatinine 0.58 WBC 7.4, hemoglobin 11.0, hematocrit 34.7, platelets 239  Lipid Panel    Component Value Date/Time   CHOL 135 06/19/2021 1108   TRIG 76 06/19/2021 1108   HDL 40 06/19/2021 1108   CHOLHDL 3.4 06/19/2021 1108   LDLCALC 80 06/19/2021 1108    Wt Readings from Last 3 Encounters:  08/16/21 (!) 309 lb 14.4 oz (140.6 kg)  08/15/21 292 lb (132.5 kg)  07/18/21 298 lb (135.2 kg)     ASSESSMENT AND PLAN:  High blood pressure Blood pressures well controlled.  Continue losartan and metoprolol.  Morbid (severe) obesity due to excess calories [facility]) She has been gaining weight since she has been out of work with her back.  Encouraged her to start doing some water aerobics in addition to her physical therapy.  I also encouraged her to get a stepper machine or something else that will allow her to do cardio when she is sitting and watching TV.  Lower extremity edema She has lower extremity edema but no JVD or heart failure symptoms.  I think this is due to venous stasis from sitting and her legs hanging down.  She is would work on elevating her legs when sitting and wear her compression stockings.  GERD (gastroesophageal reflux disease) She gets symptoms of GERD in the evening after eating a heavy meal and laying down.  We discussed waiting at least 2 hours before she lays down and elevating the head of her bed.  She will also limit red sauces and spicy foods.  If this fails she can try an over-the-counter PPI.  Continued efforts at weight loss will also help.  Pure  hypercholesterolemia Lipids are well controlled on rosuvastatin.    Current medicines are reviewed at length with the patient today.  The patient does not have concerns regarding medicines.  The following changes have been made:  None  Labs/ tests ordered today include:  Orders Placed This Encounter  Procedures   EKG 12-Lead     Disposition:   FU with Christene Pounds C. Oval Linsey, MD, [facility] in 1 year.  I,Mathew Stumpf,acting as a Education administrator for Skeet Latch, MD.,have documented all relevant documentation on the behalf of Skeet Latch, MD,as directed by  Skeet Latch, MD while in the presence of Skeet Latch, MD.  I, Logan Oval Linsey, MD have reviewed all documentation for this visit.  The documentation of the exam, diagnosis, procedures, and orders on 08/16/2021 are all accurate and complete.   Signed, Rayshaun Needle C. Oval Linsey, MD, [facility]  08/16/2021 9:26 AM    [facility]

## 2021-08-15 NOTE — Progress Notes (Signed)
Office Visit Note   Patient: Erin [name]           Date of Birth: Oct 25, 1960           MRN: [id number] Visit Date: 08/15/2021              Requested by: Glenis Smoker, MD [place],  [place] [id number] PCP: Glenis Smoker, MD   Assessment & Plan: Visit Diagnoses:  1. Protrusion of lumbar intervertebral disc     Plan: Continue PT times 4 more weeks.  Work slip given no work x4 weeks.  I discussed with her on return left release her for work activities.  She has appointment with the healthy weight and wellness working on weight loss to help unload her back and knee and hip symptoms.  Follow-Up Instructions: No follow-ups on file.   Orders:  Orders Placed This Encounter  Procedures   Ambulatory referral to Physical Therapy   No orders of the defined types were placed in this encounter.     Procedures: No procedures performed   Clinical Data: No additional findings.   Subjective: Chief Complaint  Patient presents with   Lower Back - Pain    HPI 60 year old female returns she has been going to therapy the last 8 visits while out of work for lower extremity and lumbar strengthening so that she can work resume work activities.  She noted some improvement and therapy had recommended continue strengthening for lifting activities and the other type of activities that she does when she is on the job.  She states she had to file   physical therapy on her private insurance since [employer] had not responded.  Patient has an attorney is working on this.  She still feels like she is not made enough improvement to return to regular work.  She is questing some more therapy visits.  Review of Systems all the systems update noncontributory.   Objective: Vital Signs: BP 123/76   Pulse 76   Ht 5\' 8"  (1.727 m)   Wt 292 lb (132.5 kg)   BMI 44.40 kg/m   Physical Exam Constitutional:      Appearance: She is well-developed.  HENT:     Head:  Normocephalic.     Right Ear: External ear normal.     Left Ear: External ear normal. There is no impacted cerumen.  Eyes:     Pupils: Pupils are equal, round, and reactive to light.  Neck:     Thyroid: No thyromegaly.     Trachea: No tracheal deviation.  Cardiovascular:     Rate and Rhythm: Normal rate.  Pulmonary:     Effort: Pulmonary effort is normal.  Abdominal:     Palpations: Abdomen is soft.  Musculoskeletal:     Cervical back: No rigidity.  Skin:    General: Skin is warm and dry.  Neurological:     Mental Status: She is alert and oriented to person, place, and time.  Psychiatric:        Behavior: Behavior normal.    Ortho Exam patient slow getting present standing she is ambulatory with a left knee limp. Negative logroll hips both right and left.  Knee and ankle jerks are intact. Specialty Comments:  No specialty comments available.  Imaging: No results found.   PMFS History: Patient Active Problem List   Diagnosis Date Noted   Protrusion of lumbar intervertebral disc 01/12/2020   Low back pain 05/11/2019   Morbid (  severe) obesity due to excess calories ([place]) 11/25/2016   Shortness of breath 06/13/2016   Atypical chest pain 06/13/2016   Anemia of chronic disease 03/08/2015   High blood pressure 07/22/2012   Diabetes mellitus ([place]) 07/22/2012   Past Medical History:  Diagnosis Date   Anemia    Atypical chest pain 06/13/2016   Back pain    Breast nodule 2009   Right    Diabetes mellitus    Fibroid    Symptomatic   H/O dysmenorrhea    H/O hematuria    H/O hypercholesterolemia    H/O: menorrhagia    H/O: obesity    HPV in female    Hypertension    Microhematuria 2012   Shortness of breath 06/13/2016   Yeast vaginitis 2008    Family History  Problem Relation Age of Onset   Diabetes Mother    Valvular heart disease Mother    Diabetes Brother    Hypertension Father    Stroke Father     Past Surgical History:  Procedure Laterality Date    ABDOMINAL HYSTERECTOMY     HYSTEROSCOPY WITH D & C  2003   POLYPECTOMY  2008   TUBAL LIGATION  1984   Bilateral   Social History   Occupational History   Occupation: Glass blower/designer  Tobacco Use   Smoking status: Never   Smokeless tobacco: Never  Vaping Use   Vaping Use: Never used  Substance and Sexual Activity   Alcohol use: No   Drug use: No   Sexual activity: Never    Comment: hysterectomy

## 2021-08-16 ENCOUNTER — Ambulatory Visit (HOSPITAL_BASED_OUTPATIENT_CLINIC_OR_DEPARTMENT_OTHER): Payer: 59 | Admitting: Cardiovascular Disease

## 2021-08-16 ENCOUNTER — Encounter (HOSPITAL_BASED_OUTPATIENT_CLINIC_OR_DEPARTMENT_OTHER): Payer: Self-pay | Admitting: Cardiovascular Disease

## 2021-08-16 DIAGNOSIS — E7800 Pure hypercholesterolemia, unspecified: Secondary | ICD-10-CM

## 2021-08-16 DIAGNOSIS — K219 Gastro-esophageal reflux disease without esophagitis: Secondary | ICD-10-CM | POA: Diagnosis not present

## 2021-08-16 DIAGNOSIS — I10 Essential (primary) hypertension: Secondary | ICD-10-CM | POA: Diagnosis not present

## 2021-08-16 DIAGNOSIS — R600 Localized edema: Secondary | ICD-10-CM | POA: Diagnosis not present

## 2021-08-16 HISTORY — DX: Localized edema: R60.0

## 2021-08-16 HISTORY — DX: Gastro-esophageal reflux disease without esophagitis: K21.9

## 2021-08-16 HISTORY — DX: Pure hypercholesterolemia, unspecified: E78.00

## 2021-08-16 NOTE — Assessment & Plan Note (Addendum)
She gets symptoms of GERD in the evening after eating a heavy meal and laying down.  We discussed waiting at least 2 hours before she lays down and elevating the head of her bed.  She will also limit red sauces and spicy foods.  If this fails she can try an over-the-counter PPI.  Continued efforts at weight loss will also help.

## 2021-08-16 NOTE — Assessment & Plan Note (Signed)
Lipids are well-controlled on rosuvastatin. 

## 2021-08-16 NOTE — Assessment & Plan Note (Signed)
She has lower extremity edema but no JVD or heart failure symptoms.  I think this is due to venous stasis from sitting and her legs hanging down.  She is would work on elevating her legs when sitting and wear her compression stockings.

## 2021-08-16 NOTE — Patient Instructions (Addendum)
Medication Instructions:  ?Your physician recommends that you continue on your current medications as directed. Please refer to the Current Medication list given to you today.  ? ?*If you need a refill on your cardiac medications before your next appointment, please call your pharmacy* ? ?Lab Work: ?NONE ? ?Testing/Procedures: ?NONE ? ?Follow-Up: ?At [facility], you and your health needs are our priority.  As part of our continuing mission to provide you with exceptional heart care, we have created designated Provider Care Teams.  These Care Teams include your primary Cardiologist (physician) and Advanced Practice Providers (APPs -  Physician Assistants and Nurse Practitioners) who all work together to provide you with the care you need, when you need it. ? ?We recommend signing up for the patient portal called "MyChart".  Sign up information is provided on this After Visit Summary.  MyChart is used to connect with patients for Virtual Visits (Telemedicine).  Patients are able to view lab/test results, encounter notes, upcoming appointments, etc.  Non-urgent messages can be sent to your provider as well.   ?To learn more about what you can do with MyChart, go to [email or web address].   ? ?Your next appointment:   ?12 month(s) ? ?The format for your next appointment:   ?In Person ? ?Provider:   ?Tiffany Sawyer, MD  ? ? ? ? ? ? ?

## 2021-08-16 NOTE — Assessment & Plan Note (Signed)
Blood pressures well controlled.  Continue losartan and metoprolol.

## 2021-08-16 NOTE — Assessment & Plan Note (Signed)
She has been gaining weight since she has been out of work with her back.  Encouraged her to start doing some water aerobics in addition to her physical therapy.  I also encouraged her to get a stepper machine or something else that will allow her to do cardio when she is sitting and watching TV.

## 2021-08-20 ENCOUNTER — Other Ambulatory Visit (INDEPENDENT_AMBULATORY_CARE_PROVIDER_SITE_OTHER): Payer: Self-pay | Admitting: Physician Assistant

## 2021-08-20 DIAGNOSIS — E119 Type 2 diabetes mellitus without complications: Secondary | ICD-10-CM

## 2021-08-20 DIAGNOSIS — I10 Essential (primary) hypertension: Secondary | ICD-10-CM

## 2021-08-23 ENCOUNTER — Telehealth (INDEPENDENT_AMBULATORY_CARE_PROVIDER_SITE_OTHER): Payer: Self-pay

## 2021-08-23 ENCOUNTER — Other Ambulatory Visit (INDEPENDENT_AMBULATORY_CARE_PROVIDER_SITE_OTHER): Payer: Self-pay | Admitting: Emergency Medicine

## 2021-08-23 DIAGNOSIS — I10 Essential (primary) hypertension: Secondary | ICD-10-CM

## 2021-08-23 DIAGNOSIS — E559 Vitamin D deficiency, unspecified: Secondary | ICD-10-CM

## 2021-08-23 MED ORDER — VITAMIN D (ERGOCALCIFEROL) 1.25 MG (50000 UNIT) PO CAPS: 50000.0000 [IU] | ORAL_CAPSULE | ORAL | 0 refills | Status: DC

## 2021-08-23 MED ORDER — RYBELSUS 14 MG PO TABS: 14.0000 mg | ORAL_TABLET | Freq: Every day | ORAL | 0 refills | Status: DC

## 2021-08-23 NOTE — Telephone Encounter (Signed)
LOV w/[name]

## 2021-08-23 NOTE — Telephone Encounter (Signed)
Ok to send

## 2021-08-23 NOTE — Telephone Encounter (Signed)
Medications has been sent.

## 2021-08-23 NOTE — Telephone Encounter (Signed)
Pt called in and stated that she needs a refill on her Rybelsus and Vitamin D the pt uses [facility] on [address]

## 2021-09-12 ENCOUNTER — Encounter: Payer: Self-pay | Admitting: Orthopaedic Surgery

## 2021-09-12 ENCOUNTER — Ambulatory Visit (INDEPENDENT_AMBULATORY_CARE_PROVIDER_SITE_OTHER): Payer: 59 | Admitting: Orthopaedic Surgery

## 2021-09-12 ENCOUNTER — Other Ambulatory Visit: Payer: Self-pay

## 2021-09-12 VITALS — BP 135/79 | HR 74 | Ht 69.0 in | Wt 295.0 lb

## 2021-09-12 DIAGNOSIS — M545 Low back pain, unspecified: Secondary | ICD-10-CM

## 2021-09-12 DIAGNOSIS — M5126 Other intervertebral disc displacement, lumbar region: Secondary | ICD-10-CM | POA: Diagnosis not present

## 2021-09-12 NOTE — Progress Notes (Signed)
Office Visit Note   Patient: Erin [name]           Date of Birth: 03/27/1961           MRN: [id number] Visit Date: 09/12/2021              Requested by: Glenis Smoker, MD [place],  [place] [address] PCP: Glenis Smoker, MD   Assessment & Plan: Visit Diagnoses:  1. Protrusion of lumbar intervertebral disc   2. Morbid (severe) obesity due to excess calories ([place])   3. Low back pain, unspecified back pain laterality, unspecified chronicity, unspecified whether sciatica present     Plan: Mild to moderate lumbar degenerative changes by MRI with morbid obesity and ongoing back symptoms.  Work slip given for work resumption on 09/17/2020 and I plan to recheck her in 4 months.  Follow-Up Instructions: Return in about 4 months (around 01/10/2022).   Orders:  No orders of the defined types were placed in this encounter.  No orders of the defined types were placed in this encounter.     Procedures: No procedures performed   Clinical Data: No additional findings.   Subjective: Chief Complaint  Patient presents with   Lower Back - Follow-up    HPI 61 year old female returns she is continuing to go to therapy trying to improve her ability to do work activity and resume work.  She has follow-up with weight loss clinic.  Longstanding problems with morbid obesity and has tried to lose weight.  Retirement date is in a few more months.  MRI scan from 03/2021 listed below.  She has been on anti-inflammatories in the past.  Gabapentin.   Review of Systems review of systems updated unchanged.   Objective: Vital Signs: BP 135/79    Pulse 74    Ht 5\' 9"  (1.753 m)    Wt 295 lb (133.8 kg)    BMI 43.56 kg/m   Physical Exam Constitutional:      Appearance: She is well-developed.  HENT:     Head: Normocephalic.     Right Ear: External ear normal.     Left Ear: External ear normal. There is no impacted cerumen.  Eyes:     Pupils: Pupils are equal,  round, and reactive to light.  Neck:     Thyroid: No thyromegaly.     Trachea: No tracheal deviation.  Cardiovascular:     Rate and Rhythm: Normal rate.  Pulmonary:     Effort: Pulmonary effort is normal.  Abdominal:     Palpations: Abdomen is soft.  Musculoskeletal:     Cervical back: No rigidity.  Skin:    General: Skin is warm and dry.  Neurological:     Mental Status: She is alert and oriented to person, place, and time.  Psychiatric:        Behavior: Behavior normal.    Ortho Exam patient slow deliberate gait anterior tib EHL is intact negative straight leg raising 90 degrees.  Specialty Comments:  No specialty comments available.  Imaging: FINDINGS:  There are 5 nonrib-bearing lumbar-type vertebrae. The conus medullaris terminates at a normal level. Hemangiomas within the L1 and L3 vertebral bodies. The vertebral body heights are maintained. Grade 1 anterolisthesis of L4 on L5. Mild multilevel intervertebral disc height loss. No suspicious marrow signal.   L1-2: No significant spinal canal or neural foraminal stenosis.  L2-3: Disc bulge contributes to mild spinal canal stenosis and along with facet arthrosis, moderate left  and mild right neural foraminal stenosis.  L3-4: Disc bulge contributes to mild spinal canal stenosis along with facet arthrosis contributes to mild left and mild/moderate right neural foraminal stenosis.  L4-5: Disc bulge contributes to mild spinal canal stenosis and along with facet arthrosis contributes to mild/moderate bilateral neural foraminal stenosis.  L5-S1: Disc bulge and facet arthrosis contributes to mild/moderate bilateral neural foraminal stenosis. No significant spinal canal stenosis.    IMPRESSION:  Mild-to-moderate degenerative changes of the lumbar spine.   Electronically Signed by: Linden Dolin on 04/05/2021 12:25 PM   PMFS History: Patient Active Problem List   Diagnosis Date Noted   Lower extremity edema 08/16/2021    GERD (gastroesophageal reflux disease) 08/16/2021   Pure hypercholesterolemia 08/16/2021   Protrusion of lumbar intervertebral disc 01/12/2020   Low back pain 05/11/2019   Morbid (severe) obesity due to excess calories ([place]) 11/25/2016   Shortness of breath 06/13/2016   Atypical chest pain 06/13/2016   Anemia of chronic disease 03/08/2015   High blood pressure 07/22/2012   Diabetes mellitus ([place]) 07/22/2012   Past Medical History:  Diagnosis Date   Anemia    Atypical chest pain 06/13/2016   Back pain    Breast nodule 2009   Right    Diabetes mellitus    Fibroid    Symptomatic   GERD (gastroesophageal reflux disease) 08/16/2021   H/O dysmenorrhea    H/O hematuria    H/O hypercholesterolemia    H/O: menorrhagia    H/O: obesity    HPV in female    Hypertension    Lower extremity edema 08/16/2021   Microhematuria 2012   Pure hypercholesterolemia 08/16/2021   Shortness of breath 06/13/2016   Yeast vaginitis 2008    Family History  Problem Relation Age of Onset   Diabetes Mother    Valvular heart disease Mother    Diabetes Brother    Hypertension Father    Stroke Father     Past Surgical History:  Procedure Laterality Date   ABDOMINAL HYSTERECTOMY     HYSTEROSCOPY WITH D & C  2003   POLYPECTOMY  2008   TUBAL LIGATION  1984   Bilateral   Social History   Occupational History   Occupation: Glass blower/designer  Tobacco Use   Smoking status: Never   Smokeless tobacco: Never  Vaping Use   Vaping Use: Never used  Substance and Sexual Activity   Alcohol use: No   Drug use: No   Sexual activity: Never    Comment: hysterectomy

## 2021-09-18 ENCOUNTER — Other Ambulatory Visit: Payer: Self-pay

## 2021-09-18 ENCOUNTER — Ambulatory Visit (INDEPENDENT_AMBULATORY_CARE_PROVIDER_SITE_OTHER): Payer: 59 | Admitting: Physician Assistant

## 2021-09-18 ENCOUNTER — Encounter (INDEPENDENT_AMBULATORY_CARE_PROVIDER_SITE_OTHER): Payer: Self-pay | Admitting: Physician Assistant

## 2021-09-18 VITALS — BP 133/73 | HR 65 | Temp 98.2°F | Ht 68.0 in | Wt 308.0 lb

## 2021-09-18 DIAGNOSIS — Z9189 Other specified personal risk factors, not elsewhere classified: Secondary | ICD-10-CM | POA: Diagnosis not present

## 2021-09-18 DIAGNOSIS — I10 Essential (primary) hypertension: Secondary | ICD-10-CM | POA: Diagnosis not present

## 2021-09-18 DIAGNOSIS — E119 Type 2 diabetes mellitus without complications: Secondary | ICD-10-CM | POA: Diagnosis not present

## 2021-09-18 DIAGNOSIS — E559 Vitamin D deficiency, unspecified: Secondary | ICD-10-CM | POA: Diagnosis not present

## 2021-09-18 DIAGNOSIS — Z6841 Body Mass Index (BMI) 40.0 and over, adult: Secondary | ICD-10-CM

## 2021-09-18 MED ORDER — RYBELSUS 14 MG PO TABS: 14.0000 mg | ORAL_TABLET | Freq: Every day | ORAL | 0 refills | Status: DC

## 2021-09-18 MED ORDER — VITAMIN D (ERGOCALCIFEROL) 1.25 MG (50000 UNIT) PO CAPS: 50000.0000 [IU] | ORAL_CAPSULE | ORAL | 0 refills | Status: DC

## 2021-09-18 NOTE — Progress Notes (Signed)
Chief Complaint:   OBESITY Erin [name] is here to discuss her progress with her obesity treatment plan along with follow-up of her obesity related diagnoses. Erin [name] is on the Category 3 Plan and states she is following her eating plan approximately 30% of the time. Erin [name] states she is doing 0 minutes 0 times per week.  Today's visit was #: 50 Starting weight: 330 lbs Starting date: 07/16/2018 Today's weight: 308 lbs Today's date: 09/18/2021 Total lbs lost to date: 22 Total lbs lost since last in-office visit: 0  Interim History: Erin [name] went back to work yesterday and she is trying to get back in a routine. She tends to skip meals. The Category 3 meal plan was reviewed in detail today.  Subjective:   1. Diabetes mellitus with coincident hypertension ([place]) Erin [name]'s last A1c was 6.8. She is on Rybelsus and she is tolerating it well.  2. Vitamin D deficiency Erin [name]'s last Vitamin D level was 59.0.  3. At risk for heart disease Erin [name] is at a higher than average risk for cardiovascular disease due to obesity.   Assessment/Plan:   1. Diabetes mellitus with coincident hypertension ([place]) I discussed Mounjaro with the patient today, and Gerre does not want to give herself an injection. She will continue Rybelsus, and we will refill for 1 month. Good blood sugar control is important to decrease the likelihood of diabetic complications such as nephropathy, neuropathy, limb loss, blindness, coronary artery disease, and death. Intensive lifestyle modification including diet, exercise and weight loss are the first line of treatment for diabetes.   - Semaglutide (RYBELSUS) 14 MG TABS; Take 14 mg by mouth daily.  Dispense: 30 tablet; Refill: 0  2. Vitamin D deficiency Erin [name] will continue prescription Vitamin D 50,000 IU every week, and we will refill for 1 month. She will follow-up for routine testing of Vitamin D, at least 2-3 times per year to avoid over-replacement.  - Vitamin D,  Ergocalciferol, (DRISDOL) 1.25 MG (50000 UNIT) CAPS capsule; Take 1 capsule (50,000 Units total) by mouth every 7 (seven) days.  Dispense: 4 capsule; Refill: 0  3. At risk for heart disease Erin [name] was given approximately 15 minutes of coronary artery disease prevention counseling today. She is 61 y.o. female and has risk factors for heart disease including obesity. We discussed intensive lifestyle modifications today with an emphasis on specific weight loss instructions and strategies.   Repetitive spaced learning was employed today to elicit superior memory formation and behavioral change.  4. Obesity, with current BMI of 46.84 Erin [name] is currently in the action stage of change. As such, her goal is to continue with weight loss efforts. She has agreed to the Category 3 Plan.   Exercise goals: No exercise has been prescribed at this time.  Behavioral modification strategies: meal planning and cooking strategies and keeping healthy foods in the home.  Erin [name] has agreed to follow-up with our clinic in 3 weeks. She was informed of the importance of frequent follow-up visits to maximize her success with intensive lifestyle modifications for her multiple health conditions.   Objective:   Blood pressure 133/73, pulse 65, temperature 98.2 F (36.8 C), height 5\' 8"  (1.727 m), weight (!) 308 lb (139.7 kg), SpO2 100 %. Body mass index is 46.83 kg/m.  General: Cooperative, alert, well developed, in no acute distress. HEENT: Conjunctivae and lids unremarkable. Cardiovascular: Regular rhythm.  Lungs: Normal work of breathing. Neurologic: No focal deficits.   Lab Results  Component Value Date   CREATININE 0.52 (L)  06/19/2021   BUN 7 (L) 06/19/2021   NA 141 06/19/2021   K 4.7 06/19/2021   CL 101 06/19/2021   CO2 24 06/19/2021   Lab Results  Component Value Date   ALT 11 06/19/2021   AST 12 06/19/2021   ALKPHOS 80 06/19/2021   BILITOT 0.3 06/19/2021   Lab Results  Component Value Date    HGBA1C 6.8 (H) 06/19/2021   HGBA1C 6.6 (H) 01/11/2021   HGBA1C 7.1 (H) 09/19/2020   HGBA1C 7.2 (H) 05/30/2020   HGBA1C 7.5 12/09/2019   Lab Results  Component Value Date   INSULIN 13.9 06/19/2021   INSULIN 12.5 01/11/2021   INSULIN 7.6 09/19/2020   INSULIN 17.3 05/30/2020   INSULIN 22.7 08/10/2019   Lab Results  Component Value Date   TSH 3.690 07/16/2018   Lab Results  Component Value Date   CHOL 135 06/19/2021   HDL 40 06/19/2021   LDLCALC 80 06/19/2021   TRIG 76 06/19/2021   CHOLHDL 3.4 06/19/2021   Lab Results  Component Value Date   VD25OH 59.0 06/19/2021   VD25OH 47.7 01/11/2021   VD25OH 46.6 09/19/2020   Lab Results  Component Value Date   WBC 7.4 12/09/2019   HGB 11.0 (A) 12/09/2019   HCT 35 (A) 12/09/2019   MCV 79 07/16/2018   PLT 339 12/09/2019   Lab Results  Component Value Date   IRON 38 (L) 12/20/2016   TIBC 244 12/20/2016   FERRITIN 590 (H) 12/20/2016   Attestation Statements:   Reviewed by clinician on day of visit: allergies, medications, problem list, medical history, surgical history, family history, social history, and previous encounter notes.   Wilhemena Durie, am acting as transcriptionist for [employer], PA-C.  I have reviewed the above documentation for accuracy and completeness, and I agree with the above. Abby Potash, PA-C

## 2021-10-09 ENCOUNTER — Encounter (INDEPENDENT_AMBULATORY_CARE_PROVIDER_SITE_OTHER): Payer: Self-pay | Admitting: Physician Assistant

## 2021-10-09 ENCOUNTER — Ambulatory Visit (INDEPENDENT_AMBULATORY_CARE_PROVIDER_SITE_OTHER): Payer: 59 | Admitting: Physician Assistant

## 2021-10-09 ENCOUNTER — Other Ambulatory Visit: Payer: Self-pay

## 2021-10-09 VITALS — BP 107/69 | HR 67 | Temp 98.2°F | Ht 68.0 in | Wt 301.0 lb

## 2021-10-09 DIAGNOSIS — E669 Obesity, unspecified: Secondary | ICD-10-CM

## 2021-10-09 DIAGNOSIS — I10 Essential (primary) hypertension: Secondary | ICD-10-CM

## 2021-10-09 DIAGNOSIS — E1169 Type 2 diabetes mellitus with other specified complication: Secondary | ICD-10-CM | POA: Diagnosis not present

## 2021-10-09 DIAGNOSIS — Z9189 Other specified personal risk factors, not elsewhere classified: Secondary | ICD-10-CM | POA: Diagnosis not present

## 2021-10-09 DIAGNOSIS — Z6841 Body Mass Index (BMI) 40.0 and over, adult: Secondary | ICD-10-CM

## 2021-10-09 DIAGNOSIS — E119 Type 2 diabetes mellitus without complications: Secondary | ICD-10-CM

## 2021-10-09 DIAGNOSIS — E559 Vitamin D deficiency, unspecified: Secondary | ICD-10-CM

## 2021-10-09 DIAGNOSIS — Z7984 Long term (current) use of oral hypoglycemic drugs: Secondary | ICD-10-CM

## 2021-10-09 MED ORDER — VITAMIN D (ERGOCALCIFEROL) 1.25 MG (50000 UNIT) PO CAPS: 50000.0000 [IU] | ORAL_CAPSULE | ORAL | 0 refills | Status: DC

## 2021-10-09 MED ORDER — RYBELSUS 14 MG PO TABS: 14.0000 mg | ORAL_TABLET | Freq: Every day | ORAL | 0 refills | Status: DC

## 2021-10-09 NOTE — Progress Notes (Signed)
Chief Complaint:   OBESITY Erin [name] is here to discuss her progress with her obesity treatment plan along with follow-up of her obesity related diagnoses. Erin [name] is on the Category 3 Plan and states she is following her eating plan approximately 60% of the time. Erin [name] states she is walking 5-6 miles at night at work 5 times per week.  Today's visit was #: 26 Starting weight: 330 lbs Starting date: 07/16/2018 Today's weight: 301 lbs Today's date: 10/09/2021 Total lbs lost to date: 29 Total lbs lost since last in-office visit: 7  Interim History: Pt did well with weight loss. She is eatng 3 meals a day and eating yogurt and cheese sticks for snacks. Her energy is better and hunger is controlled.  Subjective:   1. Diabetes mellitus with coincident hypertension ([place]) Pt is on Rybelsus with no nausea/vomiting/diarrhea. Her last A1c was 6.8. Appetite is controlled.  2. Vitamin D deficiency Pt denies nausea, vomiting, and muscle weakness and is on prescription Vit D.  3. At risk for osteoporosis Erin [name] is at higher risk of osteopenia and osteoporosis due to Vitamin D deficiency.   Assessment/Plan:   1. Diabetes mellitus with coincident hypertension ([place]) Good blood sugar control is important to decrease the likelihood of diabetic complications such as nephropathy, neuropathy, limb loss, blindness, coronary artery disease, and death. Intensive lifestyle modification including diet, exercise and weight loss are the first line of treatment for diabetes.   Refill- Semaglutide (RYBELSUS) 14 MG TABS; Take 14 mg by mouth daily.  Dispense: 30 tablet; Refill: 0  2. Vitamin D deficiency Low Vitamin D level contributes to fatigue and are associated with obesity, breast, and colon cancer. She agrees to continue to take prescription Vitamin D @50 ,000 IU every week and will follow-up for routine testing of Vitamin D, at least 2-3 times per year to avoid over-replacement.  Refill- Vitamin D,  Ergocalciferol, (DRISDOL) 1.25 MG (50000 UNIT) CAPS capsule; Take 1 capsule (50,000 Units total) by mouth every 7 (seven) days.  Dispense: 4 capsule; Refill: 0  3. At risk for osteoporosis Erin [name] was given approximately 15 minutes of osteoporosis prevention counseling today. Erin [name] is at risk for osteopenia and osteoporosis due to her Vitamin D deficiency. She was encouraged to take her Vit D and follow her higher calcium diet and increase strengthening exercise to help strengthen her bones and decrease her risk of osteopenia and osteoporosis.  4. Obesity, with current BMI of 45.78 Erin [name] is currently in the action stage of change. As such, her goal is to continue with weight loss efforts. She has agreed to the Category 3 Plan.   Exercise goals:  As is  Behavioral modification strategies: meal planning and cooking strategies and planning for success.  Erin [name] has agreed to follow-up with our clinic in 3 weeks. She was informed of the importance of frequent follow-up visits to maximize her success with intensive lifestyle modifications for her multiple health conditions.   Objective:   Blood pressure 107/69, pulse 67, temperature 98.2 F (36.8 C), height 5\' 8"  (1.727 m), weight (!) 301 lb (136.5 kg), SpO2 100 %. Body mass index is 45.77 kg/m.  General: Cooperative, alert, well developed, in no acute distress. HEENT: Conjunctivae and lids unremarkable. Cardiovascular: Regular rhythm.  Lungs: Normal work of breathing. Neurologic: No focal deficits.   Lab Results  Component Value Date   CREATININE 0.52 (L) 06/19/2021   BUN 7 (L) 06/19/2021   NA 141 06/19/2021   K 4.7 06/19/2021   CL 101  06/19/2021   CO2 24 06/19/2021   Lab Results  Component Value Date   ALT 11 06/19/2021   AST 12 06/19/2021   ALKPHOS 80 06/19/2021   BILITOT 0.3 06/19/2021   Lab Results  Component Value Date   HGBA1C 6.8 (H) 06/19/2021   HGBA1C 6.6 (H) 01/11/2021   HGBA1C 7.1 (H) 09/19/2020   HGBA1C 7.2 (H)  05/30/2020   HGBA1C 7.5 12/09/2019   Lab Results  Component Value Date   INSULIN 13.9 06/19/2021   INSULIN 12.5 01/11/2021   INSULIN 7.6 09/19/2020   INSULIN 17.3 05/30/2020   INSULIN 22.7 08/10/2019   Lab Results  Component Value Date   TSH 3.690 07/16/2018   Lab Results  Component Value Date   CHOL 135 06/19/2021   HDL 40 06/19/2021   LDLCALC 80 06/19/2021   TRIG 76 06/19/2021   CHOLHDL 3.4 06/19/2021   Lab Results  Component Value Date   VD25OH 59.0 06/19/2021   VD25OH 47.7 01/11/2021   VD25OH 46.6 09/19/2020   Lab Results  Component Value Date   WBC 7.4 12/09/2019   HGB 11.0 (A) 12/09/2019   HCT 35 (A) 12/09/2019   MCV 79 07/16/2018   PLT 339 12/09/2019   Lab Results  Component Value Date   IRON 38 (L) 12/20/2016   TIBC 244 12/20/2016   FERRITIN 590 (H) 12/20/2016    Attestation Statements:   Reviewed by clinician on day of visit: allergies, medications, problem list, medical history, surgical history, family history, social history, and previous encounter notes.  Coral Ceo, CMA, am acting as transcriptionist for [employer], PA-C.  I have reviewed the above documentation for accuracy and completeness, and I agree with the above. Abby Potash, PA-C

## 2021-10-31 ENCOUNTER — Ambulatory Visit (INDEPENDENT_AMBULATORY_CARE_PROVIDER_SITE_OTHER): Payer: 59 | Admitting: Physician Assistant

## 2021-11-14 LAB — COMPREHENSIVE METABOLIC PANEL
Albumin: 3.9 (ref 3.5–5.0)
Calcium: 9.4 (ref 8.7–10.7)
eGFR: 102

## 2021-11-14 LAB — BASIC METABOLIC PANEL
BUN: 11 (ref 4–21)
CO2: 30 — AB (ref 13–22)
Chloride: 106 (ref 99–108)
Creatinine: 0.6 (ref 0.5–1.1)
Glucose: 125
Potassium: 4.3 mEq/L (ref 3.5–5.1)
Sodium: 141 (ref 137–147)

## 2021-11-14 LAB — HEPATIC FUNCTION PANEL
ALT: 10 U/L (ref 7–35)
AST: 13 (ref 13–35)
Alkaline Phosphatase: 92 (ref 25–125)
Bilirubin, Total: 0.4

## 2021-11-14 LAB — HEMOGLOBIN A1C: Hemoglobin A1C: 7.2

## 2021-11-14 LAB — PROTEIN, TOTAL: Total Protein: 6.5 (ref 6.4–8.2)

## 2021-11-20 ENCOUNTER — Other Ambulatory Visit (INDEPENDENT_AMBULATORY_CARE_PROVIDER_SITE_OTHER): Payer: Self-pay | Admitting: Physician Assistant

## 2021-11-20 DIAGNOSIS — E119 Type 2 diabetes mellitus without complications: Secondary | ICD-10-CM

## 2021-11-20 NOTE — Telephone Encounter (Signed)
Erin [name] 

## 2021-11-22 ENCOUNTER — Other Ambulatory Visit: Payer: Self-pay

## 2021-11-22 ENCOUNTER — Ambulatory Visit (INDEPENDENT_AMBULATORY_CARE_PROVIDER_SITE_OTHER): Payer: 59 | Admitting: Physician Assistant

## 2021-11-22 ENCOUNTER — Encounter (INDEPENDENT_AMBULATORY_CARE_PROVIDER_SITE_OTHER): Payer: Self-pay | Admitting: Physician Assistant

## 2021-11-22 VITALS — BP 116/62 | HR 64 | Temp 98.0°F | Ht 68.0 in | Wt 303.0 lb

## 2021-11-22 DIAGNOSIS — E785 Hyperlipidemia, unspecified: Secondary | ICD-10-CM

## 2021-11-22 DIAGNOSIS — E1169 Type 2 diabetes mellitus with other specified complication: Secondary | ICD-10-CM

## 2021-11-22 DIAGNOSIS — Z6841 Body Mass Index (BMI) 40.0 and over, adult: Secondary | ICD-10-CM

## 2021-11-22 DIAGNOSIS — Z7984 Long term (current) use of oral hypoglycemic drugs: Secondary | ICD-10-CM

## 2021-11-22 DIAGNOSIS — E559 Vitamin D deficiency, unspecified: Secondary | ICD-10-CM

## 2021-11-22 MED ORDER — VITAMIN D (ERGOCALCIFEROL) 1.25 MG (50000 UNIT) PO CAPS: 50000.0000 [IU] | ORAL_CAPSULE | ORAL | 0 refills | Status: DC

## 2021-11-22 MED ORDER — RYBELSUS 14 MG PO TABS: 14.0000 mg | ORAL_TABLET | Freq: Every day | ORAL | 0 refills | Status: DC

## 2021-11-27 NOTE — Progress Notes (Signed)
? ? ? ?Chief Complaint:  ? ?OBESITY ?Erin [name] is here to discuss her progress with her obesity treatment plan along with follow-up of her obesity related diagnoses. Erin [name] is on the Category 3 Plan and states she is following her eating plan approximately 50% of the time. Erin [name] states she is walking 13,000 steps daily 5 times per week. ? ?Today's visit was #: 59 ?Starting weight: 330 lbs ?Starting date: 07/16/2018 ?Today's weight: 303 lbs ?Today's date: 11/22/2021 ?Total lbs lost to date: 27 lbs ?Total lbs lost since last in-office visit: 0 ? ?Interim History: Erin [name] states that she had shingles 3 weeks ago and made her not want to eat. Her appetite is improving but not back to normal. She is thinking about starting water aerobics.  ? ?Subjective:  ? ?1. Type 2 diabetes mellitus with hyperlipidemia ([place]) ?Erin [name] reports that her primary care physician checked her A1C last week but she is unsure of results. Her appetite is controlled.  ? ?2. Vitamin D deficiency ?Erin [name] is on Vitamin D and is taking as prescribed.  ? ?3. At risk for impaired metabolic function ?Erin [name] is at increased risk for impaired metabolic function due to not eating enough.  ? ?Assessment/Plan:  ? ?1. Type 2 diabetes mellitus with hyperlipidemia ([place]) ?We will refill Rybelsus 14 mg for 1 month with no refills. Good blood sugar control is important to decrease the likelihood of diabetic complications such as nephropathy, neuropathy, limb loss, blindness, coronary artery disease, and death. Intensive lifestyle modification including diet, exercise and weight loss are the first line of treatment for diabetes.  ? ?- Semaglutide (RYBELSUS) 14 MG TABS; Take 1 tablet (14 mg total) by mouth daily.  Dispense: 30 tablet; Refill: 0 ? ?2. Vitamin D deficiency ?Low Vitamin D level contributes to fatigue and are associated with obesity, breast, and colon cancer. We will refill prescription Vitamin D 50,000 IU every week for 1 month with no refills and Erin [name]  will follow-up for routine testing of Vitamin D, at least 2-3 times per year to avoid over-replacement. ? ?- Vitamin D, Ergocalciferol, (DRISDOL) 1.25 MG (50000 UNIT) CAPS capsule; Take 1 capsule (50,000 Units total) by mouth every 7 (seven) days.  Dispense: 4 capsule; Refill: 0 ? ?3. At risk for impaired metabolic function ?Erin [name] was given approximately 15 minutes of impaired  metabolic function prevention counseling today. We discussed intensive lifestyle modifications today with an emphasis on specific nutrition and exercise instructions and strategies.  ? ?Repetitive spaced learning was employed today to elicit superior memory formation and behavioral change.  ? ?4. Obesity, with current BMI of 45.78 ?Erin [name] is currently in the action stage of change. As such, her goal is to continue with weight loss efforts. She has agreed to the Category 3 Plan.  ? ?Exercise goals:  As is. ? ?Behavioral modification strategies: meal planning and cooking strategies. ? ?Erin [name] has agreed to follow-up with our clinic in 3 weeks. She was informed of the importance of frequent follow-up visits to maximize her success with intensive lifestyle modifications for her multiple health conditions.  ? ?Objective:  ? ?Blood pressure 116/62, pulse 64, temperature 98 ?F (36.7 ?C), height '5\' 8"'$  (1.727 m), weight (!) 303 lb (137.4 kg), SpO2 99 %. ?Body mass index is 46.07 kg/m?. ? ?General: Cooperative, alert, well developed, in no acute distress. ?HEENT: Conjunctivae and lids unremarkable. ?Cardiovascular: Regular rhythm.  ?Lungs: Normal work of breathing. ?Neurologic: No focal deficits.  ? ?Lab Results  ?Component Value Date  ? CREATININE 0.52 (L)  06/19/2021  ? BUN 7 (L) 06/19/2021  ? NA 141 06/19/2021  ? K 4.7 06/19/2021  ? CL 101 06/19/2021  ? CO2 24 06/19/2021  ? ?Lab Results  ?Component Value Date  ? ALT 11 06/19/2021  ? AST 12 06/19/2021  ? ALKPHOS 80 06/19/2021  ? BILITOT 0.3 06/19/2021  ? ?Lab Results  ?Component Value Date  ? HGBA1C  6.8 (H) 06/19/2021  ? HGBA1C 6.6 (H) 01/11/2021  ? HGBA1C 7.1 (H) 09/19/2020  ? HGBA1C 7.2 (H) 05/30/2020  ? HGBA1C 7.5 12/09/2019  ? ?Lab Results  ?Component Value Date  ? INSULIN 13.9 06/19/2021  ? INSULIN 12.5 01/11/2021  ? INSULIN 7.6 09/19/2020  ? INSULIN 17.3 05/30/2020  ? INSULIN 22.7 08/10/2019  ? ?Lab Results  ?Component Value Date  ? TSH 3.690 07/16/2018  ? ?Lab Results  ?Component Value Date  ? CHOL 135 06/19/2021  ? HDL 40 06/19/2021  ? [place] 80 06/19/2021  ? TRIG 76 06/19/2021  ? CHOLHDL 3.4 06/19/2021  ? ?Lab Results  ?Component Value Date  ? VD25OH 59.0 06/19/2021  ? VD25OH 47.7 01/11/2021  ? VD25OH 46.6 09/19/2020  ? ?Lab Results  ?Component Value Date  ? WBC 7.4 12/09/2019  ? HGB 11.0 (A) 12/09/2019  ? HCT 35 (A) 12/09/2019  ? MCV 79 07/16/2018  ? PLT 339 12/09/2019  ? ?Lab Results  ?Component Value Date  ? IRON 38 (L) 12/20/2016  ? TIBC 244 12/20/2016  ? FERRITIN 590 (H) 12/20/2016  ? ?Attestation Statements:  ? ?Reviewed by clinician on day of visit: allergies, medications, problem list, medical history, surgical history, family history, social history, and previous encounter notes. ? ?I, Tonye Pearson, am acting as Location manager for [employer], PA-C. ? ?I have reviewed the above documentation for accuracy and completeness, and I agree with the above. Abby Potash, PA-C ? ?

## 2021-12-18 ENCOUNTER — Other Ambulatory Visit (INDEPENDENT_AMBULATORY_CARE_PROVIDER_SITE_OTHER): Payer: Self-pay

## 2021-12-19 ENCOUNTER — Ambulatory Visit (INDEPENDENT_AMBULATORY_CARE_PROVIDER_SITE_OTHER): Payer: 59 | Admitting: Physician Assistant

## 2021-12-19 ENCOUNTER — Encounter (INDEPENDENT_AMBULATORY_CARE_PROVIDER_SITE_OTHER): Payer: Self-pay | Admitting: Physician Assistant

## 2021-12-19 VITALS — BP 120/69 | HR 58 | Temp 97.5°F | Ht 68.0 in | Wt 305.0 lb

## 2021-12-19 DIAGNOSIS — E669 Obesity, unspecified: Secondary | ICD-10-CM

## 2021-12-19 DIAGNOSIS — E559 Vitamin D deficiency, unspecified: Secondary | ICD-10-CM

## 2021-12-19 DIAGNOSIS — Z6841 Body Mass Index (BMI) 40.0 and over, adult: Secondary | ICD-10-CM

## 2021-12-19 DIAGNOSIS — Z9189 Other specified personal risk factors, not elsewhere classified: Secondary | ICD-10-CM

## 2021-12-19 DIAGNOSIS — Z7984 Long term (current) use of oral hypoglycemic drugs: Secondary | ICD-10-CM

## 2021-12-19 DIAGNOSIS — E1169 Type 2 diabetes mellitus with other specified complication: Secondary | ICD-10-CM | POA: Diagnosis not present

## 2021-12-19 DIAGNOSIS — E785 Hyperlipidemia, unspecified: Secondary | ICD-10-CM | POA: Diagnosis not present

## 2021-12-19 MED ORDER — VITAMIN D (ERGOCALCIFEROL) 1.25 MG (50000 UNIT) PO CAPS: 50000.0000 [IU] | ORAL_CAPSULE | ORAL | 0 refills | Status: DC

## 2021-12-19 MED ORDER — RYBELSUS 14 MG PO TABS: 14.0000 mg | ORAL_TABLET | Freq: Every day | ORAL | 0 refills | Status: DC

## 2021-12-26 NOTE — Progress Notes (Signed)
? ? ? ?Chief Complaint:  ? ?OBESITY ?Erin [name] is here to discuss her progress with her obesity treatment plan along with follow-up of her obesity related diagnoses. Erin [name] is on the Category 3 Plan and states she is following her eating plan approximately 45% of the time. Erin [name] states she is doing 0 minutes 0 times per week. ? ?Today's visit was #: 4 ?Starting weight: 330 lbs ?Starting date: 07/16/2018 ?Today's weight: 305 lbs ?Today's date: 12/19/2021 ?Total lbs lost to date: 25 lbs ?Total lbs lost since last in-office visit: 0 ? ?Interim History: For Erin [name] went to her sister's house and overindulged. She feels that her biggest issue is when she gets home from work around 1 am, she is snacking. She is not eating enough protein for lunch or dinner.  ? ?Subjective:  ? ?1. Type 2 diabetes mellitus with hyperlipidemia ([place]) ?[name]'s last A1C was 7.2. She is currently on Rybelsus 14 mg daily. She is taking as prescribed. We discussed Ozempic or Mounjaro today as an option.  ? ?2. Vitamin D deficiency ?Erin [name]'s last Vitamin D was 59.0. She is taking weekly as prescribed. She notes no side effects.  ? ?3. At risk for heart disease ?Erin [name] is at higher than average risk for cardiovascular disease due to obesity.  ? ?Assessment/Plan:  ? ?1. Type 2 diabetes mellitus with hyperlipidemia ([place]) ?We will refill Rybelsus 14 mg for 1 month with no refills. Good blood sugar control is important to decrease the likelihood of diabetic complications such as nephropathy, neuropathy, limb loss, blindness, coronary artery disease, and death. Intensive lifestyle modification including diet, exercise and weight loss are the first line of treatment for diabetes.  ? ?- Semaglutide (RYBELSUS) 14 MG TABS; Take 1 tablet (14 mg total) by mouth daily.  Dispense: 30 tablet; Refill: 0 ? ?2. Vitamin D deficiency ?Low Vitamin D level contributes to fatigue and are associated with obesity, breast, and colon cancer. We will refill prescription  Vitamin D 50,000 IU every week for 1 month with no refills and Aloni will follow-up for routine testing of Vitamin D, at least 2-3 times per year to avoid over-replacement. ? ?- Vitamin D, Ergocalciferol, (DRISDOL) 1.25 MG (50000 UNIT) CAPS capsule; Take 1 capsule (50,000 Units total) by mouth every 7 (seven) days.  Dispense: 4 capsule; Refill: 0 ? ?3. At risk for heart disease ?Erin [name] was given approximately 15 minutes of coronary artery disease prevention counseling today. She is 61 y.o. female and has risk factors for heart disease including obesity. We discussed intensive lifestyle modifications today with an emphasis on specific weight loss instructions and strategies. ? ?Repetitive spaced learning was employed today to elicit superior memory formation and behavioral change.   ? ?4. Obesity, with current BMI of 45.78 ?Erin [name] is currently in the action stage of change. As such, her goal is to continue with weight loss efforts. She has agreed to the Category 3 Plan.  ? ?Exercise goals: No exercise has been prescribed at this time. ? ?Behavioral modification strategies: increasing lean protein intake and meal planning and cooking strategies. ? ?Erin [name] has agreed to follow-up with our clinic in 4 weeks. She was informed of the importance of frequent follow-up visits to maximize her success with intensive lifestyle modifications for her multiple health conditions.  ? ?Objective:  ? ?Blood pressure 120/69, pulse (!) 58, temperature (!) 97.5 ?F (36.4 ?C), height '5\' 8"'$  (1.727 m), weight (!) 305 lb (138.3 kg), SpO2 100 %. ?Body mass index is 46.38 kg/m?. ? ?General:  Cooperative, alert, well developed, in no acute distress. ?HEENT: Conjunctivae and lids unremarkable. ?Cardiovascular: Regular rhythm.  ?Lungs: Normal work of breathing. ?Neurologic: No focal deficits.  ? ?Lab Results  ?Component Value Date  ? CREATININE 0.6 11/14/2021  ? BUN 11 11/14/2021  ? NA 141 11/14/2021  ? K 4.3 11/14/2021  ? CL 106 11/14/2021  ? CO2  30 (A) 11/14/2021  ? ?Lab Results  ?Component Value Date  ? ALT 10 11/14/2021  ? AST 13 11/14/2021  ? ALKPHOS 92 11/14/2021  ? BILITOT 0.3 06/19/2021  ? ?Lab Results  ?Component Value Date  ? HGBA1C 7.2 11/14/2021  ? HGBA1C 6.8 (H) 06/19/2021  ? HGBA1C 6.6 (H) 01/11/2021  ? HGBA1C 7.1 (H) 09/19/2020  ? HGBA1C 7.2 (H) 05/30/2020  ? ?Lab Results  ?Component Value Date  ? INSULIN 13.9 06/19/2021  ? INSULIN 12.5 01/11/2021  ? INSULIN 7.6 09/19/2020  ? INSULIN 17.3 05/30/2020  ? INSULIN 22.7 08/10/2019  ? ?Lab Results  ?Component Value Date  ? TSH 3.690 07/16/2018  ? ?Lab Results  ?Component Value Date  ? CHOL 135 06/19/2021  ? HDL 40 06/19/2021  ? [place] 80 06/19/2021  ? TRIG 76 06/19/2021  ? CHOLHDL 3.4 06/19/2021  ? ?Lab Results  ?Component Value Date  ? VD25OH 59.0 06/19/2021  ? VD25OH 47.7 01/11/2021  ? VD25OH 46.6 09/19/2020  ? ?Lab Results  ?Component Value Date  ? WBC 7.4 12/09/2019  ? HGB 11.0 (A) 12/09/2019  ? HCT 35 (A) 12/09/2019  ? MCV 79 07/16/2018  ? PLT 339 12/09/2019  ? ?Lab Results  ?Component Value Date  ? IRON 38 (L) 12/20/2016  ? TIBC 244 12/20/2016  ? FERRITIN 590 (H) 12/20/2016  ? ?Attestation Statements:  ? ?Reviewed by clinician on day of visit: allergies, medications, problem list, medical history, surgical history, family history, social history, and previous encounter notes. ? ?I, Tonye Pearson, am acting as Location manager for [employer], PA-C. ? ?I have reviewed the above documentation for accuracy and completeness, and I agree with the above. Abby Potash, PA-C ? ?

## 2022-01-01 ENCOUNTER — Other Ambulatory Visit: Payer: Self-pay | Admitting: Cardiovascular Disease

## 2022-01-11 ENCOUNTER — Ambulatory Visit: Payer: 59 | Admitting: Orthopaedic Surgery

## 2022-01-21 ENCOUNTER — Telehealth (INDEPENDENT_AMBULATORY_CARE_PROVIDER_SITE_OTHER): Payer: Self-pay | Admitting: Family Medicine

## 2022-01-21 NOTE — Telephone Encounter (Signed)
LAST APPOINTMENT DATE: 12/19/21 ?NEXT APPOINTMENT DATE: 01/31/22 (01/22/22 OV with Linus Orn had to rescheduled) ? ? ?[id number] [place] [place] - [address] ?[address] ?[facility] [address]?Phone: [phone]: [phone]? ?[id number] [place] [facility] [id number]: [phone]: [phone] ?Patient is requesting a refill of the following medications: ?No prescriptions requested or ordered in this encounter ? ? ?Date last filled: 12/19/21 ?Previously prescribed by TrLinus Orn ?Lab Results ?     Component                Value               Date                 ?     HGBA1C                   7.2                 11/14/2021           ?     HGBA1C                   6.8 (H)             06/19/2021           ?     HGBA1C                   6.6 (H)             01/11/2021           ?Lab Results ?     Component                Value               Date                 ?     MICROALBUR               <0.7                12/09/2019           ?     [place]                80                  06/19/2021           ?     CREATININE               0.6                 11/14/2021           ?Lab Results ?     Component                Value               Date                 ?     VD25OH                   59.0                06/19/2021           ?  VD25OH                   47.7                01/11/2021           ?     VD25OH                   46.6                09/19/2020           ? ?BP Readings from Last 3 Encounters: ?12/19/21 : 120/69 ?11/22/21 : 116/62 ?10/09/21 : 107/69 ?

## 2022-01-21 NOTE — Telephone Encounter (Signed)
Patient called for REFILL:  [place] :  [facility] [place] Patient phone number:  [phone] Last seen:  12/19/21 Anders Grant, PA-C COMMENTS:  5/16 appt with Olivia Mackie r/s to 5/25 with Dr. Owens Shark.  Will be out of med in 3 days

## 2022-01-22 ENCOUNTER — Other Ambulatory Visit (INDEPENDENT_AMBULATORY_CARE_PROVIDER_SITE_OTHER): Payer: Self-pay | Admitting: Physician Assistant

## 2022-01-22 ENCOUNTER — Ambulatory Visit (INDEPENDENT_AMBULATORY_CARE_PROVIDER_SITE_OTHER): Payer: 59 | Admitting: Physician Assistant

## 2022-01-22 DIAGNOSIS — E1169 Type 2 diabetes mellitus with other specified complication: Secondary | ICD-10-CM

## 2022-01-24 ENCOUNTER — Other Ambulatory Visit (INDEPENDENT_AMBULATORY_CARE_PROVIDER_SITE_OTHER): Payer: Self-pay | Admitting: Family Medicine

## 2022-01-24 DIAGNOSIS — E1169 Type 2 diabetes mellitus with other specified complication: Secondary | ICD-10-CM

## 2022-01-24 MED ORDER — RYBELSUS 14 MG PO TABS: 14.0000 mg | ORAL_TABLET | Freq: Every day | ORAL | 0 refills | Status: DC

## 2022-01-30 ENCOUNTER — Ambulatory Visit (INDEPENDENT_AMBULATORY_CARE_PROVIDER_SITE_OTHER): Payer: 59 | Admitting: Orthopaedic Surgery

## 2022-01-30 ENCOUNTER — Encounter: Payer: Self-pay | Admitting: Orthopaedic Surgery

## 2022-01-30 VITALS — BP 135/82 | Ht 69.0 in | Wt 307.0 lb

## 2022-01-30 DIAGNOSIS — M5126 Other intervertebral disc displacement, lumbar region: Secondary | ICD-10-CM | POA: Diagnosis not present

## 2022-01-30 NOTE — Progress Notes (Signed)
Office Visit Note   Patient: Erin [name]           Date of Birth: 1961/04/20           MRN: [id number] Visit Date: 01/30/2022              Requested by: Glenis Smoker, MD [place],  [place] [address] PCP: Glenis Smoker, MD   Assessment & Plan: Visit Diagnoses:  1. Protrusion of lumbar intervertebral disc   2. Morbid (severe) obesity due to excess calories ([place])     Plan: I encourage patient to participate in the weight loss center with weight reduction plans.  I encouraged her to proceed with water aerobics which a friend of hers has mentioned and they can go to the [facility] together and she has a discount through her place of employment I have encouraged her to proceed with this so she can exercise without bothering her knees or her back symptoms.  We reviewed MRI findings of her back no indication for operative intervention.  We discussed the benefit she will get with overall health with weight reduction.  Return with me on an as-needed basis.  Follow-Up Instructions: Return if symptoms worsen or fail to improve.   Orders:  No orders of the defined types were placed in this encounter.  No orders of the defined types were placed in this encounter.     Procedures: No procedures performed   Clinical Data: No additional findings.   Subjective: Chief Complaint  Patient presents with   Lower Back - Pain, Follow-up    HPI 61 year old female returns with ongoing problems with chronic back pain symptoms.  She has degenerative changes present in the lumbar spine with the last MRI listed below from 04/05/2021 done at [facility] imaging.  She states she is retiring 02/06/2021 due to ongoing back pain when she works she has been using ibuprofen and Tylenol.  The 2 together ease the pain.  She is requesting a final handicap sticker for 6 months.  She has been going to the weight loss center but recently she has gained some weight.     MPRESSION:   Mild-to-moderate degenerative changes of the lumbar spine.   Electronically Signed by: Linden Dolin on 04/05/2021 12:25 PM Narrative  MRI lumbar spine:   INDICATION: Worker's Compensation. Back pain.   TECHNIQUE: Sagittal and axial T1 and T2-weighted sequences were performed. Additional sagittal STIR images were performed.   COMPARISON: None available.   FINDINGS:  There are 5 nonrib-bearing lumbar-type vertebrae. The conus medullaris terminates at a normal level. Hemangiomas within the L1 and L3 vertebral bodies. The vertebral body heights are maintained. Grade 1 anterolisthesis of L4 on L5. Mild multilevel intervertebral disc height loss. No suspicious marrow signal.   L1-2: No significant spinal canal or neural foraminal stenosis.  L2-3: Disc bulge contributes to mild spinal canal stenosis and along with facet arthrosis, moderate left and mild right neural foraminal stenosis.  L3-4: Disc bulge contributes to mild spinal canal stenosis along with facet arthrosis contributes to mild left and mild/moderate right neural foraminal stenosis.  L4-5: Disc bulge contributes to mild spinal canal stenosis and along with facet arthrosis contributes to mild/moderate bilateral neural foraminal stenosis.  L5-S1: Disc bulge and facet arthrosis contributes to mild/moderate bilateral neural foraminal stenosis. No significant spinal canal stenosis.  Review of Systems updated unchanged.   Objective: Vital Signs: BP 135/82   Ht '5\' 9"'$  (1.753 m)   Wt (!) 307  lb (139.3 kg)   BMI 45.34 kg/m   Physical Exam Constitutional:      Appearance: She is well-developed.  HENT:     Head: Normocephalic.     Right Ear: External ear normal.     Left Ear: External ear normal. There is no impacted cerumen.  Eyes:     Pupils: Pupils are equal, round, and reactive to light.  Neck:     Thyroid: No thyromegaly.     Trachea: No tracheal deviation.  Cardiovascular:     Rate and Rhythm: Normal rate.   Pulmonary:     Effort: Pulmonary effort is normal.  Abdominal:     Palpations: Abdomen is soft.  Musculoskeletal:     Cervical back: No rigidity.  Skin:    General: Skin is warm and dry.  Neurological:     Mental Status: She is alert and oriented to person, place, and time.  Psychiatric:        Behavior: Behavior normal.    Ortho Exam patient is amatory heel-toe gait mild lower extremity edema.  No lower extremity atrophy no rash or exposed skin.  Specialty Comments:  No specialty comments available.  Imaging: No results found.   PMFS History: Patient Active Problem List   Diagnosis Date Noted   Lower extremity edema 08/16/2021   GERD (gastroesophageal reflux disease) 08/16/2021   Pure hypercholesterolemia 08/16/2021   Protrusion of lumbar intervertebral disc 01/12/2020   Low back pain 05/11/2019   Morbid (severe) obesity due to excess calories ([place]) 11/25/2016   Shortness of breath 06/13/2016   Atypical chest pain 06/13/2016   Anemia of chronic disease 03/08/2015   High blood pressure 07/22/2012   Diabetes mellitus ([place]) 07/22/2012   Past Medical History:  Diagnosis Date   Anemia    Atypical chest pain 06/13/2016   Back pain    Breast nodule 2009   Right    Diabetes mellitus    Fibroid    Symptomatic   GERD (gastroesophageal reflux disease) 08/16/2021   H/O dysmenorrhea    H/O hematuria    H/O hypercholesterolemia    H/O: menorrhagia    H/O: obesity    HPV in female    Hypertension    Lower extremity edema 08/16/2021   Microhematuria 2012   Pure hypercholesterolemia 08/16/2021   Shortness of breath 06/13/2016   Yeast vaginitis 2008    Family History  Problem Relation Age of Onset   Diabetes Mother    Valvular heart disease Mother    Diabetes Brother    Hypertension Father    Stroke Father     Past Surgical History:  Procedure Laterality Date   ABDOMINAL HYSTERECTOMY     HYSTEROSCOPY WITH D & C  2003   POLYPECTOMY  2008   TUBAL LIGATION  1984    Bilateral   Social History   Occupational History   Occupation: Glass blower/designer  Tobacco Use   Smoking status: Never   Smokeless tobacco: Never  Vaping Use   Vaping Use: Never used  Substance and Sexual Activity   Alcohol use: No   Drug use: No   Sexual activity: Never    Comment: hysterectomy

## 2022-01-31 ENCOUNTER — Ambulatory Visit (INDEPENDENT_AMBULATORY_CARE_PROVIDER_SITE_OTHER): Payer: 59 | Admitting: Bariatrics

## 2022-02-20 ENCOUNTER — Other Ambulatory Visit (INDEPENDENT_AMBULATORY_CARE_PROVIDER_SITE_OTHER): Payer: Self-pay | Admitting: Family Medicine

## 2022-02-20 DIAGNOSIS — E1169 Type 2 diabetes mellitus with other specified complication: Secondary | ICD-10-CM

## 2022-02-25 ENCOUNTER — Encounter (INDEPENDENT_AMBULATORY_CARE_PROVIDER_SITE_OTHER): Payer: Self-pay | Admitting: Bariatrics

## 2022-02-25 ENCOUNTER — Ambulatory Visit (INDEPENDENT_AMBULATORY_CARE_PROVIDER_SITE_OTHER): Payer: 59 | Admitting: Bariatrics

## 2022-02-25 VITALS — BP 119/72 | HR 67 | Temp 98.1°F | Ht 68.0 in | Wt 311.0 lb

## 2022-02-25 DIAGNOSIS — E669 Obesity, unspecified: Secondary | ICD-10-CM

## 2022-02-25 DIAGNOSIS — Z7984 Long term (current) use of oral hypoglycemic drugs: Secondary | ICD-10-CM

## 2022-02-25 DIAGNOSIS — E1169 Type 2 diabetes mellitus with other specified complication: Secondary | ICD-10-CM

## 2022-02-25 DIAGNOSIS — Z6841 Body Mass Index (BMI) 40.0 and over, adult: Secondary | ICD-10-CM

## 2022-02-25 DIAGNOSIS — E559 Vitamin D deficiency, unspecified: Secondary | ICD-10-CM

## 2022-02-25 DIAGNOSIS — E7800 Pure hypercholesterolemia, unspecified: Secondary | ICD-10-CM | POA: Diagnosis not present

## 2022-02-25 DIAGNOSIS — E785 Hyperlipidemia, unspecified: Secondary | ICD-10-CM | POA: Diagnosis not present

## 2022-02-25 MED ORDER — VITAMIN D (ERGOCALCIFEROL) 1.25 MG (50000 UNIT) PO CAPS: 50000.0000 [IU] | ORAL_CAPSULE | ORAL | 0 refills | Status: DC

## 2022-02-25 MED ORDER — RYBELSUS 14 MG PO TABS: 14.0000 mg | ORAL_TABLET | Freq: Every day | ORAL | 0 refills | Status: DC

## 2022-02-26 NOTE — Progress Notes (Unsigned)
Chief Complaint:   OBESITY Erin [name] is here to discuss her progress with her obesity treatment plan along with follow-up of her obesity related diagnoses. Erin [name] is on the Category 3 Plan and states she is following her eating plan approximately 30% of the time. Erin [name] states she is doing 0 minutes 0 times per week.  Today's visit was #: 5 Starting weight: 330 lbs Starting date: 07/26/2018 Today's weight: 311 lbs Today's date: 02/25/2022 Total lbs lost to date: 19 Total lbs lost since last in-office visit: 0  Interim History: Erin [name] is up another 6 pounds since her last visit.  She retired June 1.  Subjective:   1. Type 2 diabetes mellitus with hyperlipidemia (HCC) Erin [name] is taking Rybelsus.  2. Vitamin D deficiency Erin [name] is taking vitamin D prescription as directed.  3. Pure hypercholesterolemia Erin [name] is taking Crestor.  Assessment/Plan:   1. Type 2 diabetes mellitus with hyperlipidemia ([place]) Erin [name] will continue Rybelsus 14 mg daily, and we will refill for 1 month.  - Semaglutide (RYBELSUS) 14 MG TABS; Take 1 tablet (14 mg total) by mouth daily.  Dispense: 30 tablet; Refill: 0  2. Vitamin D deficiency Erin [name] will continue prescription vitamin D 50,000 units weekly, and we will refill for 1 month.  - Vitamin D, Ergocalciferol, (DRISDOL) 1.25 MG (50000 UNIT) CAPS capsule; Take 1 capsule (50,000 Units total) by mouth every 7 (seven) days.  Dispense: 4 capsule; Refill: 0  3. Pure hypercholesterolemia Erin [name] will continue Crestor.  She is to read labels.  4. Obesity, with current BMI of 47.3 Erin [name] is currently in the action stage of change. As such, her goal is to continue with weight loss efforts. She has agreed to the Category 3 Plan.   Meal planning.  Intentional eating.  She will adhere to the plan 80-90%.  Increase water and protein.  Low-carb snacks.  Exercise goals: She will join the gym.  Behavioral modification strategies: increasing lean protein intake,  decreasing simple carbohydrates, increasing vegetables, increasing water intake, decreasing eating out, no skipping meals, meal planning and cooking strategies, keeping healthy foods in the home, and planning for success.  Erin [name] has agreed to follow-up with our clinic in 4 weeks with myself or Erin Marble, NP. She was informed of the importance of frequent follow-up visits to maximize her success with intensive lifestyle modifications for her multiple health conditions.   Objective:   Blood pressure 119/72, pulse 67, temperature 98.1 F (36.7 C), height '5\' 8"'$  (1.727 m), weight (!) 311 lb (141.1 kg), SpO2 98 %. Body mass index is 47.29 kg/m.  General: Cooperative, alert, well developed, in no acute distress. HEENT: Conjunctivae and lids unremarkable. Cardiovascular: Regular rhythm.  Lungs: Normal work of breathing. Neurologic: No focal deficits.   Lab Results  Component Value Date   CREATININE 0.6 11/14/2021   BUN 11 11/14/2021   NA 141 11/14/2021   K 4.3 11/14/2021   CL 106 11/14/2021   CO2 30 (A) 11/14/2021   Lab Results  Component Value Date   ALT 10 11/14/2021   AST 13 11/14/2021   ALKPHOS 92 11/14/2021   BILITOT 0.3 06/19/2021   Lab Results  Component Value Date   HGBA1C 7.2 11/14/2021   HGBA1C 6.8 (H) 06/19/2021   HGBA1C 6.6 (H) 01/11/2021   HGBA1C 7.1 (H) 09/19/2020   HGBA1C 7.2 (H) 05/30/2020   Lab Results  Component Value Date   INSULIN 13.9 06/19/2021   INSULIN 12.5 01/11/2021   INSULIN 7.6 09/19/2020   INSULIN  17.3 05/30/2020   INSULIN 22.7 08/10/2019   Lab Results  Component Value Date   TSH 3.690 07/16/2018   Lab Results  Component Value Date   CHOL 135 06/19/2021   HDL 40 06/19/2021   LDLCALC 80 06/19/2021   TRIG 76 06/19/2021   CHOLHDL 3.4 06/19/2021   Lab Results  Component Value Date   VD25OH 59.0 06/19/2021   VD25OH 47.7 01/11/2021   VD25OH 46.6 09/19/2020   Lab Results  Component Value Date   WBC 7.4 12/09/2019   HGB 11.0 (A)  12/09/2019   HCT 35 (A) 12/09/2019   MCV 79 07/16/2018   PLT 339 12/09/2019   Lab Results  Component Value Date   IRON 38 (L) 12/20/2016   TIBC 244 12/20/2016   FERRITIN 590 (H) 12/20/2016   Attestation Statements:   Reviewed by clinician on day of visit: allergies, medications, problem list, medical history, surgical history, family history, social history, and previous encounter notes.   Wilhemena Durie, am acting as Location manager for [employer], DO.  I have reviewed the above documentation for accuracy and completeness, and I agree with the above. -  ***

## 2022-03-02 ENCOUNTER — Encounter (INDEPENDENT_AMBULATORY_CARE_PROVIDER_SITE_OTHER): Payer: Self-pay | Admitting: Bariatrics

## 2022-03-02 DIAGNOSIS — Z8371 Family history of colonic polyps: Secondary | ICD-10-CM | POA: Insufficient documentation

## 2022-03-02 DIAGNOSIS — Z1211 Encounter for screening for malignant neoplasm of colon: Secondary | ICD-10-CM | POA: Insufficient documentation

## 2022-03-25 ENCOUNTER — Other Ambulatory Visit (INDEPENDENT_AMBULATORY_CARE_PROVIDER_SITE_OTHER): Payer: Self-pay | Admitting: Bariatrics

## 2022-03-25 ENCOUNTER — Ambulatory Visit (INDEPENDENT_AMBULATORY_CARE_PROVIDER_SITE_OTHER): Payer: 59 | Admitting: Bariatrics

## 2022-03-25 DIAGNOSIS — E1169 Type 2 diabetes mellitus with other specified complication: Secondary | ICD-10-CM

## 2022-03-26 ENCOUNTER — Other Ambulatory Visit (INDEPENDENT_AMBULATORY_CARE_PROVIDER_SITE_OTHER): Payer: Self-pay | Admitting: Bariatrics

## 2022-03-26 ENCOUNTER — Telehealth (INDEPENDENT_AMBULATORY_CARE_PROVIDER_SITE_OTHER): Payer: Self-pay | Admitting: Bariatrics

## 2022-03-26 DIAGNOSIS — E1169 Type 2 diabetes mellitus with other specified complication: Secondary | ICD-10-CM

## 2022-03-26 MED ORDER — RYBELSUS 14 MG PO TABS
14.0000 mg | ORAL_TABLET | Freq: Every day | ORAL | 0 refills | Status: DC
Start: 2022-03-26 — End: 2022-04-03

## 2022-03-26 NOTE — Telephone Encounter (Signed)
Notified patient prescription was sent. Patient verbalized understanding.

## 2022-03-26 NOTE — Telephone Encounter (Signed)
Patient needs refill of Rybelsus asap. Pt will be out this week. Please call pt at number on file. [facility].

## 2022-04-03 ENCOUNTER — Ambulatory Visit (INDEPENDENT_AMBULATORY_CARE_PROVIDER_SITE_OTHER): Payer: 59 | Admitting: Bariatrics

## 2022-04-03 ENCOUNTER — Encounter (INDEPENDENT_AMBULATORY_CARE_PROVIDER_SITE_OTHER): Payer: Self-pay | Admitting: Bariatrics

## 2022-04-03 VITALS — BP 118/74 | HR 59 | Temp 98.5°F | Ht 68.0 in | Wt 309.0 lb

## 2022-04-03 DIAGNOSIS — E669 Obesity, unspecified: Secondary | ICD-10-CM

## 2022-04-03 DIAGNOSIS — Z6841 Body Mass Index (BMI) 40.0 and over, adult: Secondary | ICD-10-CM

## 2022-04-03 DIAGNOSIS — E1169 Type 2 diabetes mellitus with other specified complication: Secondary | ICD-10-CM | POA: Diagnosis not present

## 2022-04-03 DIAGNOSIS — E559 Vitamin D deficiency, unspecified: Secondary | ICD-10-CM | POA: Diagnosis not present

## 2022-04-03 DIAGNOSIS — Z7984 Long term (current) use of oral hypoglycemic drugs: Secondary | ICD-10-CM

## 2022-04-03 DIAGNOSIS — E785 Hyperlipidemia, unspecified: Secondary | ICD-10-CM

## 2022-04-03 MED ORDER — VITAMIN D (ERGOCALCIFEROL) 1.25 MG (50000 UNIT) PO CAPS: 50000.0000 [IU] | ORAL_CAPSULE | ORAL | 0 refills | Status: DC

## 2022-04-03 MED ORDER — RYBELSUS 14 MG PO TABS: 14.0000 mg | ORAL_TABLET | Freq: Every day | ORAL | 0 refills | Status: DC

## 2022-04-15 ENCOUNTER — Encounter (INDEPENDENT_AMBULATORY_CARE_PROVIDER_SITE_OTHER): Payer: Self-pay | Admitting: Bariatrics

## 2022-04-15 NOTE — Progress Notes (Signed)
Chief Complaint:   OBESITY Erin [name] is here to discuss her progress with her obesity treatment plan along with follow-up of her obesity related diagnoses. Erin [name] is on the Category 3 Plan and states she is following her eating plan approximately 45% of the time. Erin [name] states she is doing 0 minutes 0 times per week.  Today's visit was #: 71 Starting weight: 330 lbs Starting date: 07/26/2018 Today's weight: 309 lbs Today's date: 04/03/2022 Total lbs lost to date: 21 Total lbs lost since last in-office visit: 2  Interim History: Erin [name] is down 2 pounds since her last visit.  She is not getting enough water.  Subjective:   1. Type 2 diabetes mellitus with hyperlipidemia (HCC) Erin [name] is taking Rybelsus.  2. Vitamin D deficiency Erin [name] is taking vitamin D.  Assessment/Plan:   1. Type 2 diabetes mellitus with hyperlipidemia ([place]) Erin [name] will continue Rybelsus 14 mg once daily, and we will refill for 1 month.  - Semaglutide (RYBELSUS) 14 MG TABS; Take 1 tablet (14 mg total) by mouth daily.  Dispense: 30 tablet; Refill: 0  2. Vitamin D deficiency Erin [name] will continue prescription vitamin D 50,000 units once weekly, and we will refill for 1 month.  - Vitamin D, Ergocalciferol, (DRISDOL) 1.25 MG (50000 UNIT) CAPS capsule; Take 1 capsule (50,000 Units total) by mouth every 7 (seven) days.  Dispense: 4 capsule; Refill: 0  3. Obesity, with current BMI of 47.1 Erin [name] is currently in the action stage of change. As such, her goal is to continue with weight loss efforts. She has agreed to the Category 3 Plan.   Meal planning was discussed.  Will adhere closely to the plan.  Increase water and protein.  Exercise goals: No exercise has been prescribed at this time.  Behavioral modification strategies: increasing lean protein intake, decreasing simple carbohydrates, increasing vegetables, increasing water intake, decreasing eating out, no skipping meals, meal planning and cooking  strategies, keeping healthy foods in the home, and planning for success.  Erin [name] has agreed to follow-up with our clinic in 4 weeks. She was informed of the importance of frequent follow-up visits to maximize her success with intensive lifestyle modifications for her multiple health conditions.   Objective:   Blood pressure 118/74, pulse (!) 59, temperature 98.5 F (36.9 C), height '5\' 8"'$  (1.727 m), weight (!) 309 lb (140.2 kg), SpO2 100 %. Body mass index is 46.98 kg/m.  General: Cooperative, alert, well developed, in no acute distress. HEENT: Conjunctivae and lids unremarkable. Cardiovascular: Regular rhythm.  Lungs: Normal work of breathing. Neurologic: No focal deficits.   Lab Results  Component Value Date   CREATININE 0.6 11/14/2021   BUN 11 11/14/2021   NA 141 11/14/2021   K 4.3 11/14/2021   CL 106 11/14/2021   CO2 30 (A) 11/14/2021   Lab Results  Component Value Date   ALT 10 11/14/2021   AST 13 11/14/2021   ALKPHOS 92 11/14/2021   BILITOT 0.3 06/19/2021   Lab Results  Component Value Date   HGBA1C 7.2 11/14/2021   HGBA1C 6.8 (H) 06/19/2021   HGBA1C 6.6 (H) 01/11/2021   HGBA1C 7.1 (H) 09/19/2020   HGBA1C 7.2 (H) 05/30/2020   Lab Results  Component Value Date   INSULIN 13.9 06/19/2021   INSULIN 12.5 01/11/2021   INSULIN 7.6 09/19/2020   INSULIN 17.3 05/30/2020   INSULIN 22.7 08/10/2019   Lab Results  Component Value Date   TSH 3.690 07/16/2018   Lab Results  Component Value Date  CHOL 135 06/19/2021   HDL 40 06/19/2021   LDLCALC 80 06/19/2021   TRIG 76 06/19/2021   CHOLHDL 3.4 06/19/2021   Lab Results  Component Value Date   VD25OH 59.0 06/19/2021   VD25OH 47.7 01/11/2021   VD25OH 46.6 09/19/2020   Lab Results  Component Value Date   WBC 7.4 12/09/2019   HGB 11.0 (A) 12/09/2019   HCT 35 (A) 12/09/2019   MCV 79 07/16/2018   PLT 339 12/09/2019   Lab Results  Component Value Date   IRON 38 (L) 12/20/2016   TIBC 244 12/20/2016    FERRITIN 590 (H) 12/20/2016   Attestation Statements:   Reviewed by clinician on day of visit: allergies, medications, problem list, medical history, surgical history, family history, social history, and previous encounter notes.   Wilhemena Durie, am acting as Location manager for [employer], DO.  I have reviewed the above documentation for accuracy and completeness, and I agree with the above. Jearld Lesch, DO

## 2022-04-17 ENCOUNTER — Encounter (INDEPENDENT_AMBULATORY_CARE_PROVIDER_SITE_OTHER): Payer: Self-pay

## 2022-05-07 ENCOUNTER — Ambulatory Visit (INDEPENDENT_AMBULATORY_CARE_PROVIDER_SITE_OTHER): Payer: 59 | Admitting: Bariatrics

## 2022-05-16 ENCOUNTER — Ambulatory Visit (INDEPENDENT_AMBULATORY_CARE_PROVIDER_SITE_OTHER): Payer: 59 | Admitting: Nurse Practitioner

## 2022-05-16 ENCOUNTER — Encounter (INDEPENDENT_AMBULATORY_CARE_PROVIDER_SITE_OTHER): Payer: Self-pay | Admitting: Nurse Practitioner

## 2022-05-16 VITALS — BP 127/76 | HR 78 | Temp 98.6°F | Ht 68.0 in | Wt 313.0 lb

## 2022-05-16 DIAGNOSIS — E669 Obesity, unspecified: Secondary | ICD-10-CM | POA: Diagnosis not present

## 2022-05-16 DIAGNOSIS — E559 Vitamin D deficiency, unspecified: Secondary | ICD-10-CM | POA: Diagnosis not present

## 2022-05-16 DIAGNOSIS — E1169 Type 2 diabetes mellitus with other specified complication: Secondary | ICD-10-CM

## 2022-05-16 DIAGNOSIS — Z7984 Long term (current) use of oral hypoglycemic drugs: Secondary | ICD-10-CM

## 2022-05-16 DIAGNOSIS — E785 Hyperlipidemia, unspecified: Secondary | ICD-10-CM

## 2022-05-16 DIAGNOSIS — Z6841 Body Mass Index (BMI) 40.0 and over, adult: Secondary | ICD-10-CM

## 2022-05-16 MED ORDER — RYBELSUS 14 MG PO TABS: 14.0000 mg | ORAL_TABLET | Freq: Every day | ORAL | 0 refills | Status: DC

## 2022-05-16 MED ORDER — VITAMIN D (ERGOCALCIFEROL) 1.25 MG (50000 UNIT) PO CAPS: 50000.0000 [IU] | ORAL_CAPSULE | ORAL | 0 refills | Status: DC

## 2022-05-20 NOTE — Progress Notes (Unsigned)
Chief Complaint:   OBESITY Erin [name] is here to discuss her progress with her obesity treatment plan along with follow-up of her obesity related diagnoses. Erin [name] is on the Category 3 Plan and states she is following her eating plan approximately 20% of the time. Erin [name] states she is exercising 0 minutes 0 times per week.  Today's visit was #: 55 Starting weight: 330 lbs Starting date: 07/26/2018 Today's weight: 313 lbs Today's date: 05/16/2022 Total lbs lost to date: 17 lbs Total lbs lost since last in-office visit: 4  Interim History: Erin [name] retired June 1st 2023. Takes her grand daughter to school daily. Skipping breakfast because gets busy working around the house. Struggling with cravings in the evenings. Drinking water, sweet tea.  Subjective:   1. Type 2 diabetes mellitus with hyperlipidemia (HCC) Erin [name] is taking Rybelsus 14 mg. Denies any side effects. Not checking blood sugars at home. Last A1c was 7.2. Last eye exam: 2019. She is on a statin and ARB. Took Glipizide in the past.  2. Vitamin D deficiency Erin [name] is currently taking prescription Vit D 50,000 IU once a week.   Assessment/Plan:   1. Type 2 diabetes mellitus with hyperlipidemia (HCC) We will refill Rybelsus 14 mg daily for 1 month with 0 refills. Side effects discussed. Scheduled diabetic eye exam.  Good blood sugar control is important to decrease the likelihood of diabetic complications such as nephropathy, neuropathy, limb loss, blindness, coronary artery disease, and death. Intensive lifestyle modification including diet, exercise and weight loss are the first line of treatment for diabetes.    -Refill Semaglutide (RYBELSUS) 14 MG TABS; Take 1 tablet (14 mg total) by mouth daily.  Dispense: 30 tablet; Refill: 0  2. Vitamin D deficiency We will refill Vit D 50,000 IU weekly for 1 month with 0 refills. Side effects discussed. Low Vitamin D level contributes to fatigue and are associated with obesity,  breast, and colon cancer. She agrees to continue to take prescription Vitamin D '@50'$ ,000 IU every week and will follow-up for routine testing of Vitamin D, at least 2-3 times per year to avoid over-replacement.   -Refill Vitamin D, Ergocalciferol, (DRISDOL) 1.25 MG (50000 UNIT) CAPS capsule; Take 1 capsule (50,000 Units total) by mouth every 7 (seven) days.  Dispense: 4 capsule; Refill: 0  3. Obesity, with current BMI of 47.7 Erin [name] is currently in the action stage of change. As such, her goal is to continue with weight loss efforts. She has agreed to the Category 3 Plan.   Exercise goals: All adults should avoid inactivity. Some physical activity is better than none, and adults who participate in any amount of physical activity gain some health benefits.  Will obtain fasting labs at next visit.  Behavioral modification strategies: increasing lean protein intake, increasing vegetables, increasing water intake, and no skipping meals.  Erin [name] has agreed to follow-up with our clinic in 4 weeks. She was informed of the importance of frequent follow-up visits to maximize her success with intensive lifestyle modifications for her multiple health conditions.   Objective:   Blood pressure 127/76, pulse 78, temperature 98.6 F (37 C), height '5\' 8"'$  (1.727 m), weight (!) 313 lb (142 kg), SpO2 99 %. Body mass index is 47.59 kg/m.  General: Cooperative, alert, well developed, in no acute distress. HEENT: Conjunctivae and lids unremarkable. Cardiovascular: Regular rhythm.  Lungs: Normal work of breathing. Neurologic: No focal deficits.   Lab Results  Component Value Date   CREATININE 0.6 11/14/2021   BUN 11  11/14/2021   NA 141 11/14/2021   K 4.3 11/14/2021   CL 106 11/14/2021   CO2 30 (A) 11/14/2021   Lab Results  Component Value Date   ALT 10 11/14/2021   AST 13 11/14/2021   ALKPHOS 92 11/14/2021   BILITOT 0.3 06/19/2021   Lab Results  Component Value Date   HGBA1C 7.2 11/14/2021    HGBA1C 6.8 (H) 06/19/2021   HGBA1C 6.6 (H) 01/11/2021   HGBA1C 7.1 (H) 09/19/2020   HGBA1C 7.2 (H) 05/30/2020   Lab Results  Component Value Date   INSULIN 13.9 06/19/2021   INSULIN 12.5 01/11/2021   INSULIN 7.6 09/19/2020   INSULIN 17.3 05/30/2020   INSULIN 22.7 08/10/2019   Lab Results  Component Value Date   TSH 3.690 07/16/2018   Lab Results  Component Value Date   CHOL 135 06/19/2021   HDL 40 06/19/2021   LDLCALC 80 06/19/2021   TRIG 76 06/19/2021   CHOLHDL 3.4 06/19/2021   Lab Results  Component Value Date   VD25OH 59.0 06/19/2021   VD25OH 47.7 01/11/2021   VD25OH 46.6 09/19/2020   Lab Results  Component Value Date   WBC 7.4 12/09/2019   HGB 11.0 (A) 12/09/2019   HCT 35 (A) 12/09/2019   MCV 79 07/16/2018   PLT 339 12/09/2019   Lab Results  Component Value Date   IRON 38 (L) 12/20/2016   TIBC 244 12/20/2016   FERRITIN 590 (H) 12/20/2016   Attestation Statements:   Reviewed by clinician on day of visit: allergies, medications, problem list, medical history, surgical history, family history, social history, and previous encounter notes.  I, Brendell Tyus, RMA, am acting as transcriptionist for Everardo Pacific, FNP.  I have reviewed the above documentation for accuracy and completeness, and I agree with the above. Everardo Pacific, FNP

## 2022-06-10 ENCOUNTER — Ambulatory Visit (INDEPENDENT_AMBULATORY_CARE_PROVIDER_SITE_OTHER): Payer: 59 | Admitting: Family Medicine

## 2022-06-18 ENCOUNTER — Encounter (INDEPENDENT_AMBULATORY_CARE_PROVIDER_SITE_OTHER): Payer: Self-pay | Admitting: Family Medicine

## 2022-06-18 ENCOUNTER — Ambulatory Visit (INDEPENDENT_AMBULATORY_CARE_PROVIDER_SITE_OTHER): Payer: 59 | Admitting: Family Medicine

## 2022-06-18 VITALS — BP 148/80 | HR 61 | Temp 97.9°F | Ht 68.0 in | Wt 317.0 lb

## 2022-06-18 DIAGNOSIS — E639 Nutritional deficiency, unspecified: Secondary | ICD-10-CM

## 2022-06-18 DIAGNOSIS — E559 Vitamin D deficiency, unspecified: Secondary | ICD-10-CM | POA: Insufficient documentation

## 2022-06-18 DIAGNOSIS — Z6841 Body Mass Index (BMI) 40.0 and over, adult: Secondary | ICD-10-CM

## 2022-06-18 DIAGNOSIS — E1169 Type 2 diabetes mellitus with other specified complication: Secondary | ICD-10-CM | POA: Diagnosis not present

## 2022-06-18 DIAGNOSIS — E669 Obesity, unspecified: Secondary | ICD-10-CM

## 2022-06-18 DIAGNOSIS — Z72821 Inadequate sleep hygiene: Secondary | ICD-10-CM

## 2022-06-18 DIAGNOSIS — Z7984 Long term (current) use of oral hypoglycemic drugs: Secondary | ICD-10-CM

## 2022-06-18 MED ORDER — RYBELSUS 14 MG PO TABS: 14.0000 mg | ORAL_TABLET | Freq: Every day | ORAL | 0 refills | Status: DC

## 2022-06-18 MED ORDER — VITAMIN D (ERGOCALCIFEROL) 1.25 MG (50000 UNIT) PO CAPS: 50000.0000 [IU] | ORAL_CAPSULE | ORAL | 0 refills | Status: DC

## 2022-06-20 NOTE — Progress Notes (Signed)
Chief Complaint:   OBESITY Erin [name] is here to discuss her progress with her obesity treatment plan along with follow-up of her obesity related diagnoses. Erin [name] is on the Category 3 Plan and states she is following her eating plan approximately 30% of the time. Erin [name] states she has not been exercising.  Today's visit was #: 28 Starting weight: 330 lbs Starting date: 07/26/2018 Today's weight: 317 lbs Today's date: 06/18/22 Total lbs lost to date: 13 Total lbs lost since last in-office visit: +4  Interim History: Retired this summer-used to work second shift.  Eats at 11 AM, 7 PM, 1:30 AM.  Sleeps 3 AM to 7 AM.  Naps 8:30 to 11 AM.  Takes care of granddaughter-30 years old.  Meals are okay.  Snacks at night are sweets.  Lacks water.  Drinks sweet tea.  Net weight loss is 13 pounds in almost 4 years.  Subjective:   1. Type 2 diabetes mellitus with other specified complication, unspecified whether long term insulin use (HCC) On metformin XR 500 mg-(supposed to take 2 tabs twice daily with food)-only taking 1 tab in the a.m. and 2 tabs at night. On Rybelsus 14 mg daily.  Last A1c 7.2.  2. Inadequate sleep  Discussed importance of sleep for weight management. Currently getting 4 to 6 hours of broken sleep.  3. Vitamin D deficiency On prescription vitamin D 50,000 IU weekly.  4. Poor nutrition Poor adherence to category 3 meal plan with excess sugar late at night.  Showing lack of adequate weight loss over 4 years (13 pounds).  Motivation is still there to make changes.  Assessment/Plan:   1. Type 2 diabetes mellitus with other specified complication, unspecified whether long term insulin use (HCC) Continue current meds.  Discussed changing Rybelsus to [facility] for better weight loss but she declined. Refilled: - Semaglutide (RYBELSUS) 14 MG TABS; Take 1 tablet (14 mg total) by mouth daily.  Dispense: 30 tablet; Refill: 0 Check labs: - Hemoglobin A1c - Insulin, random - Lipid  panel  2. Inadequate sleep  Try moving bedtime to 11 PM, work on sleep consolidation.  3. Vitamin D deficiency Refill: - Vitamin D, Ergocalciferol, (DRISDOL) 1.25 MG (50000 UNIT) CAPS capsule; Take 1 capsule (50,000 Units total) by mouth every 7 (seven) days.  Dispense: 5 capsule; Refill: 0 Check vitamin D level today: - Vitamin D, 25-Hydroxy, Total  4. Poor nutrition Discussed importance of following through on meal plan.  5. Obesity,current BMI 48.2 1.  Reviewed protein sources. 2.  Change last snack to a meal. 3.  Get in bed by 11 PM.  Erin [name] is currently in the action stage of change. As such, her goal is to continue with weight loss efforts. She has agreed to the Category 3 Plan.   Exercise goals: Plans to go to the community center-start 2 times per week.  Behavioral modification strategies: increasing lean protein intake, increasing vegetables, increasing water intake, no skipping meals, meal planning and cooking strategies, keeping healthy foods in the home, better snacking choices, planning for success, and decreasing junk food.  Erin [name] has agreed to follow-up with our clinic in 3 weeks. She was informed of the importance of frequent follow-up visits to maximize her success with intensive lifestyle modifications for her multiple health conditions.   Erin [name] was informed we would discuss her lab results at her next visit unless there is a critical issue that needs to be addressed sooner. Erin [name] agreed to keep her next visit at the agreed  upon time to discuss these results.  Objective:   Blood pressure (!) 148/80, pulse 61, temperature 97.9 F (36.6 C), height '5\' 8"'$  (1.727 m), weight (!) 317 lb (143.8 kg), SpO2 100 %. Body mass index is 48.2 kg/m.  General: Cooperative, alert, well developed, in no acute distress. HEENT: Conjunctivae and lids unremarkable. Cardiovascular: Regular rhythm.  Lungs: Normal work of breathing. Neurologic: No focal deficits.   Lab Results   Component Value Date   CREATININE 0.6 11/14/2021   BUN 11 11/14/2021   NA 141 11/14/2021   K 4.3 11/14/2021   CL 106 11/14/2021   CO2 30 (A) 11/14/2021   Lab Results  Component Value Date   ALT 10 11/14/2021   AST 13 11/14/2021   ALKPHOS 92 11/14/2021   BILITOT 0.3 06/19/2021   Lab Results  Component Value Date   HGBA1C 7.2 (H) 06/18/2022   HGBA1C 7.2 11/14/2021   HGBA1C 6.8 (H) 06/19/2021   HGBA1C 6.6 (H) 01/11/2021   HGBA1C 7.1 (H) 09/19/2020   Lab Results  Component Value Date   INSULIN 19.0 06/18/2022   INSULIN 13.9 06/19/2021   INSULIN 12.5 01/11/2021   INSULIN 7.6 09/19/2020   INSULIN 17.3 05/30/2020   Lab Results  Component Value Date   TSH 3.690 07/16/2018   Lab Results  Component Value Date   CHOL 139 06/18/2022   HDL 48 06/18/2022   LDLCALC 78 06/18/2022   TRIG 66 06/18/2022   CHOLHDL 2.9 06/18/2022   Lab Results  Component Value Date   VD25OH 59.0 06/19/2021   VD25OH 47.7 01/11/2021   VD25OH 46.6 09/19/2020   Lab Results  Component Value Date   WBC 7.4 12/09/2019   HGB 11.0 (A) 12/09/2019   HCT 35 (A) 12/09/2019   MCV 79 07/16/2018   PLT 339 12/09/2019   Lab Results  Component Value Date   IRON 38 (L) 12/20/2016   TIBC 244 12/20/2016   FERRITIN 590 (H) 12/20/2016    Attestation Statements:   Reviewed by clinician on day of visit: allergies, medications, problem list, medical history, surgical history, family history, social history, and previous encounter notes.  I, Georgianne Fick, FNP, am acting as transcriptionist for Dr. Loyal Gambler.  I have reviewed the above documentation for accuracy and completeness, and I agree with the above. Dell Ponto, DO

## 2022-06-24 LAB — VITAMIN D, 25-HYDROXY, TOTAL: Vitamin D, 25-Hydroxy, Serum: 30 ng/mL

## 2022-06-24 LAB — LIPID PANEL
Chol/HDL Ratio: 2.9 ratio (ref 0.0–4.4)
Cholesterol, Total: 139 mg/dL (ref 100–199)
HDL: 48 mg/dL (ref >39)
LDL Chol Calc (NIH): 78 mg/dL (ref 0–99)
Triglycerides: 66 mg/dL (ref 0–149)
VLDL Cholesterol Cal: 13 mg/dL (ref 5–40)

## 2022-06-24 LAB — HEMOGLOBIN A1C
Est. average glucose Bld gHb Est-mCnc: 160 mg/dL
Hgb A1c MFr Bld: 7.2 % — ABNORMAL HIGH (ref 4.8–5.6)

## 2022-06-24 LAB — INSULIN, RANDOM: INSULIN: 19 u[IU]/mL (ref 2.6–24.9)

## 2022-06-28 ENCOUNTER — Other Ambulatory Visit: Payer: Self-pay | Admitting: Cardiovascular Disease

## 2022-06-28 NOTE — Telephone Encounter (Signed)
Rx request sent to pharmacy.  

## 2022-07-16 ENCOUNTER — Ambulatory Visit (INDEPENDENT_AMBULATORY_CARE_PROVIDER_SITE_OTHER): Payer: 59 | Admitting: Family Medicine

## 2022-07-16 ENCOUNTER — Encounter (INDEPENDENT_AMBULATORY_CARE_PROVIDER_SITE_OTHER): Payer: Self-pay | Admitting: Family Medicine

## 2022-07-16 VITALS — BP 143/80 | HR 66 | Temp 98.1°F | Ht 68.0 in | Wt 319.0 lb

## 2022-07-16 DIAGNOSIS — I10 Essential (primary) hypertension: Secondary | ICD-10-CM | POA: Insufficient documentation

## 2022-07-16 DIAGNOSIS — G8929 Other chronic pain: Secondary | ICD-10-CM

## 2022-07-16 DIAGNOSIS — E1169 Type 2 diabetes mellitus with other specified complication: Secondary | ICD-10-CM | POA: Diagnosis not present

## 2022-07-16 DIAGNOSIS — Z7984 Long term (current) use of oral hypoglycemic drugs: Secondary | ICD-10-CM

## 2022-07-16 DIAGNOSIS — M79671 Pain in right foot: Secondary | ICD-10-CM

## 2022-07-16 DIAGNOSIS — E559 Vitamin D deficiency, unspecified: Secondary | ICD-10-CM | POA: Diagnosis not present

## 2022-07-16 DIAGNOSIS — E669 Obesity, unspecified: Secondary | ICD-10-CM

## 2022-07-16 DIAGNOSIS — Z6841 Body Mass Index (BMI) 40.0 and over, adult: Secondary | ICD-10-CM

## 2022-07-16 MED ORDER — VITAMIN D (ERGOCALCIFEROL) 1.25 MG (50000 UNIT) PO CAPS: 50000.0000 [IU] | ORAL_CAPSULE | ORAL | 0 refills | Status: DC

## 2022-07-16 MED ORDER — RYBELSUS 14 MG PO TABS: 14.0000 mg | ORAL_TABLET | Freq: Every day | ORAL | 0 refills | Status: DC

## 2022-07-25 NOTE — Progress Notes (Signed)
Chief Complaint:   OBESITY Erin [name] is here to discuss her progress with her obesity treatment plan along with follow-up of her obesity related diagnoses. Erin [name] is on the Category 3 Plan and states she is following her eating plan approximately 30% of the time. Erin [name] states she is walking 15 minutes 3-4 times per week.  Today's visit was #: 32 Starting weight: 330 lbs Starting date: 07/26/2018 Today's weight: 319 lbs Today's date: 07/16/2022 Total lbs lost to date: 11 lbs Total lbs lost since last in-office visit: +2 lbs  Interim History: Typically eating late breakfast, dinner and late night snack.  Still has broken sleep at night.  Craving starches and sweets.  Keeps cookies in the house.  Gained about 20 lbs since retiring from active job a year ago.  Not sticking to meal plan.    Subjective:   1. Chronic foot pain, right Walking is limited.  Has access to a bike and elliptical.    2. Type 2 diabetes mellitus with other specified complication, unspecified whether long term insulin use ([place]) Discussed labs with patient today. She is on Rybelsus 14 mg every day and metformin without adverse side effects.  Last A1c was 7.2. Has some improved satiety on Rybelsus and ha refused injectable GLP-1 agonist.    3. Vitamin D deficiency She is currently taking prescription vitamin D 50,000 IU each week. She denies nausea, vomiting or muscle weakness. Vitamin D level was 30.   4. Essential hypertension Blood pressure elevated today on metoprolol 25 mg BID and losartan 50 mg daily. Not checking home blood pressures.  Not retaining any excess fluids.    Assessment/Plan:   1. Chronic foot pain, right Plan to short intervals on  spin bike 2 days per week and going to the community center 2 days per week.   2. Type 2 diabetes mellitus with other specified complication, unspecified whether long term insulin use (HCC) Improve compliance with decreasing sugar intake, refined carbs and  increase exercise.   Refill - Semaglutide (RYBELSUS) 14 MG TABS; Take 1 tablet (14 mg total) by mouth daily.  Dispense: 30 tablet; Refill: 0  3. Vitamin D deficiency Refill - Vitamin D, Ergocalciferol, (DRISDOL) 1.25 MG (50000 UNIT) CAPS capsule; Take 1 capsule (50,000 Units total) by mouth every 7 (seven) days.  Dispense: 5 capsule; Refill: 0  4. Essential hypertension Consider  increasing losartan dose at next office visit if blood pressure is >140/90.  5. Obesity,current BMI 48.5 1) Reviewed overall progress.  Net decrease 11 lb in 4 years medically supervised weight management.  This is a 3.3% TBW loss.  Less than expected due to non adherence with both nutrition plan and the exercise.   2) Start using my fitness pal everyday.    Erin [name] is currently in the action stage of change. As such, her goal is to continue with weight loss efforts. She has agreed to the Category 3 Plan and keeping a food journal and adhering to recommended goals of 1500 calories and 100 protein daily.   Exercise goals:  20 minutes 4 times per week on bike or elliptical.   Behavioral modification strategies: increasing lean protein intake, decreasing simple carbohydrates, increasing water intake, decreasing liquid calories, increasing high fiber foods, decreasing eating out, no skipping meals, meal planning and cooking strategies, keeping healthy foods in the home, avoiding temptations, and decreasing junk food.  Erin [name] has agreed to follow-up with our clinic in 4 weeks. She was informed of the importance of  frequent follow-up visits to maximize her success with intensive lifestyle modifications for her multiple health conditions.   Objective:   Blood pressure (!) 143/80, pulse 66, temperature 98.1 F (36.7 C), height '5\' 8"'$  (1.727 m), weight (!) 319 lb (144.7 kg), SpO2 99 %. Body mass index is 48.5 kg/m.  General: Cooperative, alert, well developed, in no acute distress. HEENT: Conjunctivae and lids  unremarkable. Cardiovascular: Regular rhythm.  Lungs: Normal work of breathing. Neurologic: No focal deficits.   Lab Results  Component Value Date   CREATININE 0.6 11/14/2021   BUN 11 11/14/2021   NA 141 11/14/2021   K 4.3 11/14/2021   CL 106 11/14/2021   CO2 30 (A) 11/14/2021   Lab Results  Component Value Date   ALT 10 11/14/2021   AST 13 11/14/2021   ALKPHOS 92 11/14/2021   BILITOT 0.3 06/19/2021   Lab Results  Component Value Date   HGBA1C 7.2 (H) 06/18/2022   HGBA1C 7.2 11/14/2021   HGBA1C 6.8 (H) 06/19/2021   HGBA1C 6.6 (H) 01/11/2021   HGBA1C 7.1 (H) 09/19/2020   Lab Results  Component Value Date   INSULIN 19.0 06/18/2022   INSULIN 13.9 06/19/2021   INSULIN 12.5 01/11/2021   INSULIN 7.6 09/19/2020   INSULIN 17.3 05/30/2020   Lab Results  Component Value Date   TSH 3.690 07/16/2018   Lab Results  Component Value Date   CHOL 139 06/18/2022   HDL 48 06/18/2022   LDLCALC 78 06/18/2022   TRIG 66 06/18/2022   CHOLHDL 2.9 06/18/2022   Lab Results  Component Value Date   VD25OH 59.0 06/19/2021   VD25OH 47.7 01/11/2021   VD25OH 46.6 09/19/2020   Lab Results  Component Value Date   WBC 7.4 12/09/2019   HGB 11.0 (A) 12/09/2019   HCT 35 (A) 12/09/2019   MCV 79 07/16/2018   PLT 339 12/09/2019   Lab Results  Component Value Date   IRON 38 (L) 12/20/2016   TIBC 244 12/20/2016   FERRITIN 590 (H) 12/20/2016    Attestation Statements:   Reviewed by clinician on day of visit: allergies, medications, problem list, medical history, surgical history, family history, social history, and previous encounter notes.  I, Davy Pique, am acting as Location manager for Loyal Gambler, DO.  I have reviewed the above documentation for accuracy and completeness, and I agree with the above. Dell Ponto, DO

## 2022-08-22 ENCOUNTER — Ambulatory Visit (INDEPENDENT_AMBULATORY_CARE_PROVIDER_SITE_OTHER): Payer: 59 | Admitting: Family Medicine

## 2022-08-22 ENCOUNTER — Encounter (INDEPENDENT_AMBULATORY_CARE_PROVIDER_SITE_OTHER): Payer: Self-pay | Admitting: Family Medicine

## 2022-08-22 ENCOUNTER — Telehealth (INDEPENDENT_AMBULATORY_CARE_PROVIDER_SITE_OTHER): Payer: Self-pay | Admitting: Family Medicine

## 2022-08-22 ENCOUNTER — Other Ambulatory Visit (INDEPENDENT_AMBULATORY_CARE_PROVIDER_SITE_OTHER): Payer: Self-pay | Admitting: Family Medicine

## 2022-08-22 DIAGNOSIS — E1169 Type 2 diabetes mellitus with other specified complication: Secondary | ICD-10-CM

## 2022-08-22 NOTE — Telephone Encounter (Signed)
Phone call to patient to let her know we will refill her rybelsus at her Monday appointment with [facility].

## 2022-08-22 NOTE — Telephone Encounter (Signed)
Patient called wanting a refill of Semaglutide (RYBELSUS) 14 MG TABS. Please call pt at [phone].

## 2022-08-26 ENCOUNTER — Telehealth (INDEPENDENT_AMBULATORY_CARE_PROVIDER_SITE_OTHER): Payer: 59 | Admitting: Family Medicine

## 2022-08-26 ENCOUNTER — Telehealth (INDEPENDENT_AMBULATORY_CARE_PROVIDER_SITE_OTHER): Payer: Self-pay | Admitting: Family Medicine

## 2022-08-26 DIAGNOSIS — E559 Vitamin D deficiency, unspecified: Secondary | ICD-10-CM

## 2022-08-26 DIAGNOSIS — E1169 Type 2 diabetes mellitus with other specified complication: Secondary | ICD-10-CM

## 2022-08-26 MED ORDER — VITAMIN D (ERGOCALCIFEROL) 1.25 MG (50000 UNIT) PO CAPS: 50000.0000 [IU] | ORAL_CAPSULE | ORAL | 0 refills | Status: DC

## 2022-08-26 MED ORDER — RYBELSUS 14 MG PO TABS: 14.0000 mg | ORAL_TABLET | Freq: Every day | ORAL | 0 refills | Status: DC

## 2022-08-26 NOTE — Telephone Encounter (Signed)
Patient unable to log onto virtual visit this morning.  Has commercial [employer] which will not cover telephonic visits.  Rybelsus and vitamin D refilled.  Patient instructed to make office appointment for after the first of the year.

## 2022-08-26 NOTE — Progress Notes (Deleted)
TeleHealth Visit:  This visit was completed with telemedicine (audio/video) technology. Erin [name] has verbally consented to this TeleHealth visit. The patient is located at home, the provider is located at home. The participants in this visit include the listed provider and patient. The visit was conducted today via MyChart video.  OBESITY Erin [name] is here to discuss her progress with her obesity treatment plan along with follow-up of her obesity related diagnoses.   Today's visit was # 35 Starting weight: 330 lbs Starting date: 07/26/2018 Weight at last in office visit: 319 lbs on 07/16/22 Total weight loss: 11 lbs at last in office visit on 07/16/22. Today's reported weight: *** lbs No weight reported.   Nutrition Plan: the Category 3 Plan and keeping a food journal and adhering to recommended goals of 1500 calories and 100 grams protein daily.   Current exercise: {exercise types:16438} walking 15 minutes 3-4 times per week.  Interim History:  ***  Assessment/Plan:  1. ***  2. ***  3. ***  Obesity: Current BMI *** Erin [name] {CHL AMB IS/IS NOT:210130109} currently in the action stage of change. As such, her goal is to {MWMwtloss#1:210800005}.  She has agreed to {MWMwtlossportion/plan2:23431}.   Exercise goals: {MWM EXERCISE RECS:23473}  Behavioral modification strategies: {MWMwtlossdietstrategies3:23432}.  Hilliary has agreed to follow-up with our clinic in {NUMBER 1-10:22536} weeks.   No orders of the defined types were placed in this encounter.   There are no discontinued medications.   No orders of the defined types were placed in this encounter.     Objective:   VITALS: Per patient if applicable, see vitals. GENERAL: Alert and in no acute distress. CARDIOPULMONARY: No increased WOB. Speaking in clear sentences.  PSYCH: Pleasant and cooperative. Speech normal rate and rhythm. Affect is appropriate. Insight and judgement are appropriate. Attention is focused,  linear, and appropriate.  NEURO: Oriented as arrived to appointment on time with no prompting.   Lab Results  Component Value Date   CREATININE 0.6 11/14/2021   BUN 11 11/14/2021   NA 141 11/14/2021   K 4.3 11/14/2021   CL 106 11/14/2021   CO2 30 (A) 11/14/2021   Lab Results  Component Value Date   ALT 10 11/14/2021   AST 13 11/14/2021   ALKPHOS 92 11/14/2021   BILITOT 0.3 06/19/2021   Lab Results  Component Value Date   HGBA1C 7.2 (H) 06/18/2022   HGBA1C 7.2 11/14/2021   HGBA1C 6.8 (H) 06/19/2021   HGBA1C 6.6 (H) 01/11/2021   HGBA1C 7.1 (H) 09/19/2020   Lab Results  Component Value Date   INSULIN 19.0 06/18/2022   INSULIN 13.9 06/19/2021   INSULIN 12.5 01/11/2021   INSULIN 7.6 09/19/2020   INSULIN 17.3 05/30/2020   Lab Results  Component Value Date   TSH 3.690 07/16/2018   Lab Results  Component Value Date   CHOL 139 06/18/2022   HDL 48 06/18/2022   LDLCALC 78 06/18/2022   TRIG 66 06/18/2022   CHOLHDL 2.9 06/18/2022   Lab Results  Component Value Date   WBC 7.4 12/09/2019   HGB 11.0 (A) 12/09/2019   HCT 35 (A) 12/09/2019   MCV 79 07/16/2018   PLT 339 12/09/2019   Lab Results  Component Value Date   IRON 38 (L) 12/20/2016   TIBC 244 12/20/2016   FERRITIN 590 (H) 12/20/2016   Lab Results  Component Value Date   VD25OH 59.0 06/19/2021   VD25OH 47.7 01/11/2021   VD25OH 46.6 09/19/2020    Attestation Statements:   Reviewed by  clinician on day of visit: allergies, medications, problem list, medical history, surgical history, family history, social history, and previous encounter notes.  ***(delete if time-based billing not used) Time spent on visit including the items listed below was *** minutes.  -preparing to see the patient (e.g., review of tests, history, previous notes) -obtaining and/or reviewing separately obtained history -counseling and educating the patient/family/caregiver -documenting clinical information in the electronic or other  health record -ordering medications, tests, or procedures -independently interpreting results and communicating results to the patient/ family/caregiver -referring and communicating with other health care professionals  -care coordination

## 2022-09-16 ENCOUNTER — Ambulatory Visit (INDEPENDENT_AMBULATORY_CARE_PROVIDER_SITE_OTHER): Payer: 59 | Admitting: Family Medicine

## 2022-09-16 ENCOUNTER — Encounter (INDEPENDENT_AMBULATORY_CARE_PROVIDER_SITE_OTHER): Payer: Self-pay | Admitting: Family Medicine

## 2022-09-16 VITALS — BP 152/79 | HR 77 | Temp 97.9°F | Ht 68.0 in | Wt 321.0 lb

## 2022-09-16 DIAGNOSIS — M79671 Pain in right foot: Secondary | ICD-10-CM

## 2022-09-16 DIAGNOSIS — E1169 Type 2 diabetes mellitus with other specified complication: Secondary | ICD-10-CM

## 2022-09-16 DIAGNOSIS — G8929 Other chronic pain: Secondary | ICD-10-CM | POA: Diagnosis not present

## 2022-09-16 DIAGNOSIS — Z6841 Body Mass Index (BMI) 40.0 and over, adult: Secondary | ICD-10-CM

## 2022-09-16 DIAGNOSIS — I10 Essential (primary) hypertension: Secondary | ICD-10-CM

## 2022-09-16 DIAGNOSIS — E559 Vitamin D deficiency, unspecified: Secondary | ICD-10-CM

## 2022-09-16 DIAGNOSIS — Z7984 Long term (current) use of oral hypoglycemic drugs: Secondary | ICD-10-CM

## 2022-09-16 DIAGNOSIS — E669 Obesity, unspecified: Secondary | ICD-10-CM

## 2022-09-16 MED ORDER — VITAMIN D (ERGOCALCIFEROL) 1.25 MG (50000 UNIT) PO CAPS: 50000.0000 [IU] | ORAL_CAPSULE | ORAL | 0 refills | Status: DC

## 2022-09-16 MED ORDER — LOSARTAN POTASSIUM 100 MG PO TABS: 100.0000 mg | ORAL_TABLET | Freq: Every day | ORAL | 0 refills | Status: DC

## 2022-09-16 MED ORDER — RYBELSUS 14 MG PO TABS: 14.0000 mg | ORAL_TABLET | Freq: Every day | ORAL | 0 refills | Status: DC

## 2022-09-17 DIAGNOSIS — M79671 Pain in right foot: Secondary | ICD-10-CM | POA: Insufficient documentation

## 2022-09-25 ENCOUNTER — Other Ambulatory Visit: Payer: Self-pay | Admitting: Cardiovascular Disease

## 2022-09-30 NOTE — Progress Notes (Signed)
Chief Complaint:   OBESITY Erin [name] is here to discuss her progress with her obesity treatment plan along with follow-up of her obesity related diagnoses. Erin [name] is on the Category 3 Plan and states she is following her eating plan approximately 25% of the time. Erin [name] states she is not exercising.  Today's visit was #: 58 Starting weight: 330 LBS Starting date: 07/26/2018 Today's weight: 321 LBS Today's date: 09/16/2022 Total lbs lost to date: 9 LBS Total lbs lost since last in-office visit: +2 LBS  Interim History: Net weight loss: Increased 9 pounds in 4+ years of medically supervised weight management.  Struggles with compliance on meal planning especially over the holidays.  Exercise limited by chronic right foot pain.  Motivated to be better.  Did gain 1 LB of muscle mass.  Patient has retired.  Subjective:   1. Essential hypertension Blood pressure elevated today.  Patient is on losartan 50 mg daily and metoprolol 25 mg twice daily.  2. Type 2 diabetes mellitus with other specified complication, without long-term current use of insulin ([place]) Patient is doing well on Rybelsus 14 mg daily and metformin XR 500 mg 2 tabs twice daily.  Patient's last A1c was 7.2 on 10/19/2021.  Patient denies GI side effects.  3. Vitamin D deficiency Patient is on prescription vitamin D 50,000 IU weekly.  On 06/18/2022 vitamin D level was 30.  4. Chronic foot pain, right Patient has tried spin bike, can do 5 minutes and plans to increase to 15 minutes.  Walking time is limited due to pain. This has impacted her total weight loss.  Assessment/Plan:   1. Essential hypertension Increase losartan to 100 mg once a day.  Prescription sent for 30 tablets no refills.  Keep metoprolol 25 mg twice daily.  Start- losartan (COZAAR) 100 MG tablet; Take 1 tablet (100 mg total) by mouth daily.  Dispense: 30 tablet; Refill: 0  2. Type 2 diabetes mellitus with other specified complication, without long-term  current use of insulin (HCC) Check A1c at next visit.  Refill- Semaglutide (RYBELSUS) 14 MG TABS; Take 1 tablet (14 mg total) by mouth daily.  Dispense: 30 tablet; Refill: 0  3. Vitamin D deficiency Recheck vitamin D level at next office visit.  Refill- Vitamin D, Ergocalciferol, (DRISDOL) 1.25 MG (50000 UNIT) CAPS capsule; Take 1 capsule (50,000 Units total) by mouth every 7 (seven) days.  Dispense: 5 capsule; Refill: 0  4. Chronic foot pain, right Use Tylenol 650 mg 3 times daily, as needed for pain.  Exercise as tolerated.  5. Obesity,current BMI 48.9 1.  Aim for 120 g of protein daily. 2.  Do not skip meals, category 3 meal plan copy given.  Erin [name] is currently in the action stage of change. As such, her goal is to continue with weight loss efforts. She has agreed to the Category 3 Plan +120 protein daily..   Exercise goals: All adults should avoid inactivity. Some physical activity is better than none, and adults who participate in any amount of physical activity gain some health benefits.  Behavioral modification strategies: increasing lean protein intake, increasing water intake, decreasing liquid calories, meal planning and cooking strategies, keeping healthy foods in the home, ways to avoid boredom eating, and planning for success.  Erin [name] has agreed to follow-up with our clinic in 4 weeks. She was informed of the importance of frequent follow-up visits to maximize her success with intensive lifestyle modifications for her multiple health conditions.   Objective:   Blood  pressure (!) 152/79, pulse 77, temperature 97.9 F (36.6 C), height '5\' 8"'$  (1.727 m), weight (!) 321 lb (145.6 kg), SpO2 100 %. Body mass index is 48.81 kg/m.  General: Cooperative, alert, well developed, in no acute distress. HEENT: Conjunctivae and lids unremarkable. Cardiovascular: Regular rhythm.  Lungs: Normal work of breathing. Neurologic: No focal deficits.   Lab Results  Component Value Date    CREATININE 0.6 11/14/2021   BUN 11 11/14/2021   NA 141 11/14/2021   K 4.3 11/14/2021   CL 106 11/14/2021   CO2 30 (A) 11/14/2021   Lab Results  Component Value Date   ALT 10 11/14/2021   AST 13 11/14/2021   ALKPHOS 92 11/14/2021   BILITOT 0.3 06/19/2021   Lab Results  Component Value Date   HGBA1C 7.2 (H) 06/18/2022   HGBA1C 7.2 11/14/2021   HGBA1C 6.8 (H) 06/19/2021   HGBA1C 6.6 (H) 01/11/2021   HGBA1C 7.1 (H) 09/19/2020   Lab Results  Component Value Date   INSULIN 19.0 06/18/2022   INSULIN 13.9 06/19/2021   INSULIN 12.5 01/11/2021   INSULIN 7.6 09/19/2020   INSULIN 17.3 05/30/2020   Lab Results  Component Value Date   TSH 3.690 07/16/2018   Lab Results  Component Value Date   CHOL 139 06/18/2022   HDL 48 06/18/2022   LDLCALC 78 06/18/2022   TRIG 66 06/18/2022   CHOLHDL 2.9 06/18/2022   Lab Results  Component Value Date   VD25OH 59.0 06/19/2021   VD25OH 47.7 01/11/2021   VD25OH 46.6 09/19/2020   Lab Results  Component Value Date   WBC 7.4 12/09/2019   HGB 11.0 (A) 12/09/2019   HCT 35 (A) 12/09/2019   MCV 79 07/16/2018   PLT 339 12/09/2019   Lab Results  Component Value Date   IRON 38 (L) 12/20/2016   TIBC 244 12/20/2016   FERRITIN 590 (H) 12/20/2016   Attestation Statements:   Reviewed by clinician on day of visit: allergies, medications, problem list, medical history, surgical history, family history, social history, and previous encounter notes.  I, Davy Pique, am acting as Location manager for Loyal Gambler, DO.  I have reviewed the above documentation for accuracy and completeness, and I agree with the above. Dell Ponto, DO

## 2022-10-10 ENCOUNTER — Other Ambulatory Visit (INDEPENDENT_AMBULATORY_CARE_PROVIDER_SITE_OTHER): Payer: Self-pay | Admitting: Family Medicine

## 2022-10-10 DIAGNOSIS — I10 Essential (primary) hypertension: Secondary | ICD-10-CM

## 2022-10-14 ENCOUNTER — Other Ambulatory Visit (INDEPENDENT_AMBULATORY_CARE_PROVIDER_SITE_OTHER): Payer: Self-pay | Admitting: Family Medicine

## 2022-10-14 DIAGNOSIS — I10 Essential (primary) hypertension: Secondary | ICD-10-CM

## 2022-10-16 ENCOUNTER — Ambulatory Visit (INDEPENDENT_AMBULATORY_CARE_PROVIDER_SITE_OTHER): Payer: 59 | Admitting: Family Medicine

## 2022-10-16 ENCOUNTER — Encounter (INDEPENDENT_AMBULATORY_CARE_PROVIDER_SITE_OTHER): Payer: Self-pay | Admitting: Family Medicine

## 2022-10-16 VITALS — BP 139/64 | HR 64 | Temp 98.2°F | Ht 68.0 in | Wt 319.0 lb

## 2022-10-16 DIAGNOSIS — I10 Essential (primary) hypertension: Secondary | ICD-10-CM

## 2022-10-16 DIAGNOSIS — E1169 Type 2 diabetes mellitus with other specified complication: Secondary | ICD-10-CM | POA: Diagnosis not present

## 2022-10-16 DIAGNOSIS — E559 Vitamin D deficiency, unspecified: Secondary | ICD-10-CM

## 2022-10-16 DIAGNOSIS — Z7984 Long term (current) use of oral hypoglycemic drugs: Secondary | ICD-10-CM

## 2022-10-16 DIAGNOSIS — M79671 Pain in right foot: Secondary | ICD-10-CM

## 2022-10-16 DIAGNOSIS — Z6841 Body Mass Index (BMI) 40.0 and over, adult: Secondary | ICD-10-CM

## 2022-10-16 MED ORDER — RYBELSUS 14 MG PO TABS: 14.0000 mg | ORAL_TABLET | Freq: Every day | ORAL | 0 refills | Status: DC

## 2022-10-16 MED ORDER — LOSARTAN POTASSIUM 100 MG PO TABS: 100.0000 mg | ORAL_TABLET | Freq: Every day | ORAL | 0 refills | Status: DC

## 2022-10-16 MED ORDER — VITAMIN D (ERGOCALCIFEROL) 1.25 MG (50000 UNIT) PO CAPS: 50000.0000 [IU] | ORAL_CAPSULE | ORAL | 0 refills | Status: DC

## 2022-10-17 LAB — HEMOGLOBIN A1C
Est. average glucose Bld gHb Est-mCnc: 171 mg/dL
Hgb A1c MFr Bld: 7.6 % — ABNORMAL HIGH (ref 4.8–5.6)

## 2022-10-17 LAB — VITAMIN D 25 HYDROXY (VIT D DEFICIENCY, FRACTURES): Vit D, 25-Hydroxy: 48.5 ng/mL (ref 30.0–100.0)

## 2022-10-29 NOTE — Progress Notes (Signed)
Chief Complaint:   OBESITY Erin [name] is here to discuss her progress with her obesity treatment plan along with follow-up of her obesity related diagnoses. Erin [name] is on the Category 3 Plan +120 protein daily and states she is following her eating plan approximately 35% of the time. Erin [name] states she is not exercising.  Today's visit was #: 83 Starting weight: 330 lbs Starting date: 07/26/2018 Today's weight: 319 lbs Today's date: 10/16/2022 Total lbs lost to date: 11 lbs Total lbs lost since last in-office visit: 2 lbs  Interim History: Net weight loss 11 pounds in greater than 4 years.  Patient has been trying to avoid late night eating but stays up late.  Sometimes has a [place] bar at night and ice cream occasionally.  Patient is motivated to make better choices.  She plans to walk her driveway with granddaughter more often, but is limited by right foot pain.  Subjective:   1. Type 2 diabetes mellitus with other specified complication, without long-term current use of insulin ([place]) Patient last A1c on 10/19/2021, 7.2.  Patient is taking metformin 1000 mg XR twice daily and Rybelsus 14 mg daily.  Patient denies GI side effects.  2. Vitamin D deficiency Patient is taking prescription vitamin D 50,000 IU weekly.  Last vitamin D level was 30 on 06/18/2022.  3. Essential hypertension Patient has increase losartan to 100 mg daily.  Blood pressure has improved.  Patient was dizzy the first week but feels fine now.  Patient takes metoprolol 25 mg twice daily.  4. Right foot pain chronic Patient has seen podiatry.  Holding off on any surgeries.  Patient can walk 10 to 15 minutes at a time.  Assessment/Plan:   1. Type 2 diabetes mellitus with other specified complication, without long-term current use of insulin (HCC) Check A1c level today.  Discussed changing Rybelsus to [facility].  Refill- Semaglutide (RYBELSUS) 14 MG TABS; Take 1 tablet (14 mg total) by mouth daily.  Dispense: 30 tablet;  Refill: 0  - Hemoglobin A1c  2. Vitamin D deficiency Check labs today.  Refill- Vitamin D, Ergocalciferol, (DRISDOL) 1.25 MG (50000 UNIT) CAPS capsule; Take 1 capsule (50,000 Units total) by mouth every 7 (seven) days.  Dispense: 5 capsule; Refill: 0  - VITAMIN D 25 Hydroxy (Vit-D Deficiency, Fractures)  3. Essential hypertension Refill- losartan (COZAAR) 100 MG tablet; Take 1 tablet (100 mg total) by mouth daily.  Dispense: 30 tablet; Refill: 0  4. Right foot pain chronic Wear proper sneakers, aim for 15 minutes of walking at least 2 times per day.  5. Morbid obesity ([name])  6. Obesity,current BMI 48.5 1.  Reviewed body fat loss on bioimpedance scale. 2.  Firm up walking plan with a goal for 15 minutes 1-2 times per day.  Erin [name] is currently in the action stage of change. As such, her goal is to continue with weight loss efforts. She has agreed to the Category 3 Plan +120 protein daily..   Exercise goals: All adults should avoid inactivity. Some physical activity is better than none, and adults who participate in any amount of physical activity gain some health benefits.  Behavioral modification strategies: increasing lean protein intake, increasing vegetables, increasing water intake, decreasing eating out, no skipping meals, meal planning and cooking strategies, keeping healthy foods in the home, and planning for success.  Ilean has agreed to follow-up with our clinic in 4 weeks. She was informed of the importance of frequent follow-up visits to maximize her success with intensive  lifestyle modifications for her multiple health conditions.   Objective:   Blood pressure 139/64, pulse 64, temperature 98.2 F (36.8 C), height 5' 8"$  (1.727 m), weight (!) 319 lb (144.7 kg), SpO2 100 %. Body mass index is 48.5 kg/m.  General: Cooperative, alert, well developed, in no acute distress. HEENT: Conjunctivae and lids unremarkable. Cardiovascular: Regular rhythm.  Lungs: Normal work of  breathing. Neurologic: No focal deficits.   Lab Results  Component Value Date   CREATININE 0.6 11/14/2021   BUN 11 11/14/2021   NA 141 11/14/2021   K 4.3 11/14/2021   CL 106 11/14/2021   CO2 30 (A) 11/14/2021   Lab Results  Component Value Date   ALT 10 11/14/2021   AST 13 11/14/2021   ALKPHOS 92 11/14/2021   BILITOT 0.3 06/19/2021   Lab Results  Component Value Date   HGBA1C 7.6 (H) 10/16/2022   HGBA1C 7.2 (H) 06/18/2022   HGBA1C 7.2 11/14/2021   HGBA1C 6.8 (H) 06/19/2021   HGBA1C 6.6 (H) 01/11/2021   Lab Results  Component Value Date   INSULIN 19.0 06/18/2022   INSULIN 13.9 06/19/2021   INSULIN 12.5 01/11/2021   INSULIN 7.6 09/19/2020   INSULIN 17.3 05/30/2020   Lab Results  Component Value Date   TSH 3.690 07/16/2018   Lab Results  Component Value Date   CHOL 139 06/18/2022   HDL 48 06/18/2022   LDLCALC 78 06/18/2022   TRIG 66 06/18/2022   CHOLHDL 2.9 06/18/2022   Lab Results  Component Value Date   VD25OH 48.5 10/16/2022   VD25OH 59.0 06/19/2021   VD25OH 47.7 01/11/2021   Lab Results  Component Value Date   WBC 7.4 12/09/2019   HGB 11.0 (A) 12/09/2019   HCT 35 (A) 12/09/2019   MCV 79 07/16/2018   PLT 339 12/09/2019   Lab Results  Component Value Date   IRON 38 (L) 12/20/2016   TIBC 244 12/20/2016   FERRITIN 590 (H) 12/20/2016   Attestation Statements:   Reviewed by clinician on day of visit: allergies, medications, problem list, medical history, surgical history, family history, social history, and previous encounter notes.  I, Davy Pique, am acting as Location manager for Loyal Gambler, DO.  I have reviewed the above documentation for accuracy and completeness, and I agree with the above. Dell Ponto, DO

## 2022-11-12 ENCOUNTER — Ambulatory Visit (INDEPENDENT_AMBULATORY_CARE_PROVIDER_SITE_OTHER): Payer: 59 | Admitting: Family Medicine

## 2022-11-15 ENCOUNTER — Other Ambulatory Visit (INDEPENDENT_AMBULATORY_CARE_PROVIDER_SITE_OTHER): Payer: Self-pay | Admitting: Family Medicine

## 2022-11-15 DIAGNOSIS — I10 Essential (primary) hypertension: Secondary | ICD-10-CM

## 2022-11-19 ENCOUNTER — Other Ambulatory Visit (INDEPENDENT_AMBULATORY_CARE_PROVIDER_SITE_OTHER): Payer: Self-pay | Admitting: Family Medicine

## 2022-11-19 DIAGNOSIS — I10 Essential (primary) hypertension: Secondary | ICD-10-CM

## 2022-11-26 ENCOUNTER — Encounter (INDEPENDENT_AMBULATORY_CARE_PROVIDER_SITE_OTHER): Payer: Self-pay | Admitting: Family Medicine

## 2022-11-26 ENCOUNTER — Ambulatory Visit (INDEPENDENT_AMBULATORY_CARE_PROVIDER_SITE_OTHER): Payer: 59 | Admitting: Family Medicine

## 2022-11-26 VITALS — BP 134/79 | HR 74 | Temp 98.2°F | Ht 68.0 in | Wt 317.0 lb

## 2022-11-26 DIAGNOSIS — E559 Vitamin D deficiency, unspecified: Secondary | ICD-10-CM | POA: Diagnosis not present

## 2022-11-26 DIAGNOSIS — I10 Essential (primary) hypertension: Secondary | ICD-10-CM | POA: Diagnosis not present

## 2022-11-26 DIAGNOSIS — E1169 Type 2 diabetes mellitus with other specified complication: Secondary | ICD-10-CM

## 2022-11-26 DIAGNOSIS — Z7985 Long-term (current) use of injectable non-insulin antidiabetic drugs: Secondary | ICD-10-CM

## 2022-11-26 DIAGNOSIS — Z6841 Body Mass Index (BMI) 40.0 and over, adult: Secondary | ICD-10-CM

## 2022-11-26 MED ORDER — GLUCOSE BLOOD VI STRP: ORAL_STRIP | 12 refills | Status: DC

## 2022-11-26 MED ORDER — LOSARTAN POTASSIUM 100 MG PO TABS: 100.0000 mg | ORAL_TABLET | Freq: Every day | ORAL | 0 refills | Status: DC

## 2022-11-26 MED ORDER — RYBELSUS 14 MG PO TABS: 14.0000 mg | ORAL_TABLET | Freq: Every day | ORAL | 0 refills | Status: DC

## 2022-11-26 MED ORDER — VITAMIN D (ERGOCALCIFEROL) 1.25 MG (50000 UNIT) PO CAPS: 50000.0000 [IU] | ORAL_CAPSULE | ORAL | 0 refills | Status: DC

## 2022-11-26 NOTE — Assessment & Plan Note (Signed)
BP goal is <130/80 Taking Losartan 100 mg daily without adverse side effect + metoprolol 25 mg bid  Plan: Reduce high frequency of restaurant food which is high in sodium Repeat BP next visit

## 2022-11-26 NOTE — Progress Notes (Signed)
Office: [phone]  /  Fax: [place]  Starting Date: 07/26/18  Starting Weight: 330lb   Weight Lost Since Last Visit: 2lb   Vitals Temp: 98.2 F (36.8 C) BP: 134/79 Pulse Rate: 74 SpO2: 100 %   Body Composition  Body Fat %: 55.4 % Fat Mass (lbs): 175.8 lbs Muscle Mass (lbs): 134.2 lbs Visceral Fat Rating : 21   HPI  Chief Complaint: OBESITY  Erin [name] is here to discuss her progress with her obesity treatment plan. She is on the the Category 3 Plan and states she is following her eating plan approximately 30 % of the time. She states she is exercising 0 minutes 0 times per week.   Interval History:  Since last office visit she is down 2 lb She has a net weight loss of 13 lb in 4+ years of medically supervised weight management She plans to work on improving her protein intake She eats grilled chicken, fish, eggs, greek yogurt She is eating little fruit  She doesn't like to cook much and usually has dinner at [employer] She does make her own lunch and breakfast She is eating more snacks/ sweets on the weekends. She stays up till 1 am, often having a late night snack She does little exercise due to chronic R foot pain, holding off on surgery  Pharmacotherapy: Rybelsus  PHYSICAL EXAM:  Blood pressure 134/79, pulse 74, temperature 98.2 F (36.8 C), height 5\' 8"  (1.727 m), weight (!) 317 lb (143.8 kg), SpO2 100 %. Body mass index is 48.2 kg/m.  General: She is overweight, cooperative, alert, well developed, and in no acute distress. PSYCH: Has normal mood, affect and thought process.   Lungs: Normal breathing effort, no conversational dyspnea.   ASSESSMENT AND PLAN  TREATMENT PLAN FOR OBESITY:  Recommended Dietary Goals  Erin [name] is currently in the action stage of change. As such, her goal is to continue weight management plan. She has agreed to the Category 3 Plan.  Behavioral Intervention  We discussed the following  Behavioral Modification Strategies today: increasing lean protein intake, increasing vegetables, increasing lower glycemic fruits, increasing water intake, work on meal planning and easy cooking plans, decreasing sodium intake, decreasing eating out, consumption of processed foods, and making healthy choices when eating convenient foods, and ways to avoid night time snacking.  Additional resources provided today: NA  Recommended Physical Activity Goals  Erin [name] has been advised to work up to 150 minutes of moderate intensity aerobic activity a week and strengthening exercises 2-3 times per week for cardiovascular health, weight loss maintenance and preservation of muscle mass.   She has agreed to Increase physical activity in their day and reduce sedentary time (increase NEAT).  Pharmacotherapy changes for the treatment of obesity: no changes  ASSOCIATED CONDITIONS ADDRESSED TODAY  Type 2 diabetes mellitus with other specified complication, without long-term current use of insulin ([place]) -     Rybelsus; Take 1 tablet (14 mg total) by mouth daily.  Dispense: 30 tablet; Refill: 0 -     Glucose Blood; Use as instructed twice daily  Dispense: 100 each; Refill: 12  Vitamin D deficiency Assessment & Plan: Last vitamin D Lab Results  Component Value Date   VD25OH 48.5 10/16/2022   Reviewed lab from last visit.  Vitamin D level improving.  Discussed target Vitamin D level 50-70.    Plan: continue/ refill RX vitamin D 50,0000 IU weekly Recheck level 3-4 mos  Orders: -     Vitamin  D (Ergocalciferol); Take 1 capsule (50,000 Units total) by mouth every 7 (seven) days.  Dispense: 5 capsule; Refill: 0  Essential hypertension Assessment & Plan: BP goal is <130/80 Taking Losartan 100 mg daily without adverse side effect + metoprolol 25 mg bid  Plan: Reduce high frequency of restaurant food which is high in sodium Repeat BP next visit  Orders: -     Losartan Potassium; Take 1 tablet (100 mg  total) by mouth daily.  Dispense: 30 tablet; Refill: 0  Morbid obesity (HCC)  BMI 45.0-49.9, adult ([place])  Type 2 diabetes mellitus with other specified complication, unspecified whether long term insulin use ([place]) Assessment & Plan: Doing well on metformin XR 1000 mg bid + Rybelsus 14 mg daily. Denies GI side effects from either medicine Has been eating more sweets on the weekend Lab Results  Component Value Date   HGBA1C 7.6 (H) 10/16/2022   Reviewed A1c result from last visit Glucose log sheet given.  Test strips refilled to check AM fasting and 2 hrs after dinner. Limited exercise due to R foot pain       She was informed of the importance of frequent follow up visits to maximize her success with intensive lifestyle modifications for her multiple health conditions.   ATTESTASTION STATEMENTS:  Reviewed by clinician on day of visit: allergies, medications, problem list, medical history, surgical history, family history, social history, and previous encounter notes pertinent to obesity diagnosis.   I have personally spent 30 minutes total time today in preparation, patient care, nutritional counseling and documentation for this visit, including the following: review of clinical lab tests; review of medical tests/procedures/services.      Dell Ponto, DO DABFM, DABOM [facility] [address] [place], [place] [address] [phone]

## 2022-11-26 NOTE — Assessment & Plan Note (Signed)
Last vitamin D Lab Results  Component Value Date   VD25OH 48.5 10/16/2022   Reviewed lab from last visit.  Vitamin D level improving.  Discussed target Vitamin D level 50-70.    Plan: continue/ refill RX vitamin D 50,0000 IU weekly Recheck level 3-4 mos

## 2022-11-26 NOTE — Assessment & Plan Note (Addendum)
Doing well on metformin XR 1000 mg bid + Rybelsus 14 mg daily. Denies GI side effects from either medicine Has been eating more sweets on the weekend Lab Results  Component Value Date   HGBA1C 7.6 (H) 10/16/2022   Reviewed A1c result from last visit Glucose log sheet given.  Test strips refilled to check AM fasting and 2 hrs after dinner. Limited exercise due to R foot pain

## 2022-12-17 ENCOUNTER — Other Ambulatory Visit: Payer: Self-pay | Admitting: Cardiovascular Disease

## 2022-12-17 NOTE — Telephone Encounter (Signed)
Please call pt to schedule overdue follow-up appointment with Dr. Duke Salvia or APP for refills. Last OV 08/2021. Thank you!

## 2022-12-19 ENCOUNTER — Other Ambulatory Visit (INDEPENDENT_AMBULATORY_CARE_PROVIDER_SITE_OTHER): Payer: Self-pay | Admitting: Family Medicine

## 2022-12-19 DIAGNOSIS — I10 Essential (primary) hypertension: Secondary | ICD-10-CM

## 2022-12-20 NOTE — Telephone Encounter (Signed)
Left message for patient to call and schedule overdue follow up  with Dr. [place] / APP for medication refills 

## 2022-12-23 ENCOUNTER — Other Ambulatory Visit (INDEPENDENT_AMBULATORY_CARE_PROVIDER_SITE_OTHER): Payer: Self-pay | Admitting: Family Medicine

## 2022-12-23 DIAGNOSIS — I10 Essential (primary) hypertension: Secondary | ICD-10-CM

## 2022-12-24 ENCOUNTER — Other Ambulatory Visit (INDEPENDENT_AMBULATORY_CARE_PROVIDER_SITE_OTHER): Payer: Self-pay | Admitting: Family Medicine

## 2022-12-24 DIAGNOSIS — E1169 Type 2 diabetes mellitus with other specified complication: Secondary | ICD-10-CM

## 2022-12-26 NOTE — Telephone Encounter (Signed)
Left message for patient to call and schedule overdue follow up  with Dr. [place] / APP for medication refills 

## 2022-12-31 ENCOUNTER — Ambulatory Visit (INDEPENDENT_AMBULATORY_CARE_PROVIDER_SITE_OTHER): Payer: 59 | Admitting: Physician Assistant

## 2022-12-31 ENCOUNTER — Encounter (INDEPENDENT_AMBULATORY_CARE_PROVIDER_SITE_OTHER): Payer: Self-pay | Admitting: Physician Assistant

## 2022-12-31 VITALS — BP 121/73 | HR 62 | Temp 98.2°F | Ht 68.0 in | Wt 316.0 lb

## 2022-12-31 DIAGNOSIS — E1159 Type 2 diabetes mellitus with other circulatory complications: Secondary | ICD-10-CM

## 2022-12-31 DIAGNOSIS — E559 Vitamin D deficiency, unspecified: Secondary | ICD-10-CM | POA: Diagnosis not present

## 2022-12-31 DIAGNOSIS — Z6841 Body Mass Index (BMI) 40.0 and over, adult: Secondary | ICD-10-CM | POA: Insufficient documentation

## 2022-12-31 DIAGNOSIS — Z7984 Long term (current) use of oral hypoglycemic drugs: Secondary | ICD-10-CM

## 2022-12-31 DIAGNOSIS — E1169 Type 2 diabetes mellitus with other specified complication: Secondary | ICD-10-CM | POA: Diagnosis not present

## 2022-12-31 DIAGNOSIS — I152 Hypertension secondary to endocrine disorders: Secondary | ICD-10-CM | POA: Diagnosis not present

## 2022-12-31 MED ORDER — RYBELSUS 14 MG PO TABS: 14.0000 mg | ORAL_TABLET | Freq: Every day | ORAL | 0 refills | Status: DC

## 2022-12-31 MED ORDER — LOSARTAN POTASSIUM 100 MG PO TABS: 100.0000 mg | ORAL_TABLET | Freq: Every day | ORAL | 0 refills | Status: DC

## 2022-12-31 MED ORDER — VITAMIN D (ERGOCALCIFEROL) 1.25 MG (50000 UNIT) PO CAPS: 50000.0000 [IU] | ORAL_CAPSULE | ORAL | 0 refills | Status: DC

## 2022-12-31 MED ORDER — BLOOD GLUCOSE TEST VI STRP: 1.0000 | ORAL_STRIP | Freq: Three times a day (TID) | 0 refills | Status: AC

## 2022-12-31 NOTE — Progress Notes (Signed)
Office: [phone]  /  Fax: [phone]  WEIGHT SUMMARY AND BIOMETRICS  Vitals Temp: 98.2 F (36.8 C) BP: 121/73 Pulse Rate: 62 SpO2: 100 %   Anthropometric Measurements Height:  (1.727 m) Weight: (!) 316 lb (143.3 kg) BMI (Calculated): 48.06 Weight at Last Visit: 317 lb Weight Lost Since Last Visit: 1 lb Weight Gained Since Last Visit: 0 lb Starting Weight: 330 lb Total Weight Loss (lbs): 14 lb (6.35 kg)   Body Composition  Body Fat %: 54.8 % Fat Mass (lbs): 173.2 lbs Muscle Mass (lbs): 135.6 lbs Total Body Water (lbs): 109.4 lbs Visceral Fat Rating : 20   Other Clinical Data Fasting: no Labs: no Today's Visit #: 12 Starting Date: 07/26/18     HPI  Chief Complaint: OBESITY  Erin [name] is here to discuss her progress with her obesity treatment plan. She is on the the Category 3 Plan and states she is following her eating plan approximately 30 % of the time. She states she is exercising /walking some 7 times per week.   Interval History:  Since last office visit she is down 1 lb. Hunger/appetite- well controlled overall. Fair adherence to eating plan. Does well with breakfast and lunch most days, then grab and go a lot at dinner time. Discussed the importance of getting adequate protein to help with hunger, maintain and improve muscle mass and metabolism.  Retired, but helps care for 19 yr old granddaughter- to and after school.  Exercise limited by foot pain. Discussed some strategies including chair yoga to increase exercise/movement.  Using water bottle to maintain good hydration.    Pharmacotherapy: Rybelsus 14 mg daily. No side effects with Rybelsus. No GI side effects, no neck mass or difficulty swallowing. Mood stable. Has been hesitant to try alternative injectable GLP-1 medications like Ozempic or Mounjaro., but has not yet met A1c goal of < 7 and weight loss on Rybelsus has been minimal.  Discussed Mounjaro today and she is going to look at  the [employer] for more information and we can discuss further at the next visit.   PHYSICAL EXAM:  Blood pressure 121/73, pulse 62, temperature 98.2 F (36.8 C), height  (1.727 m), weight (!) 316 lb (143.3 kg), SpO2 100 %. Body mass index is 48.05 kg/m.  General: She is overweight, cooperative, alert, well developed, and in no acute distress. PSYCH: Has normal mood, affect and thought process.   Cardiovascular; HR 60's regular.  Lungs: Normal breathing effort, no conversational dyspnea. Neuro: no focal deficits  DIAGNOSTIC DATA REVIEWED:  BMET    Component Value Date/Time   NA 141 11/14/2021 0000   NA 141 12/20/2016 1003   K 4.3 11/14/2021 0000   K 4.1 12/20/2016 1003   CL 106 11/14/2021 0000   CO2 30 (A) 11/14/2021 0000   CO2 28 12/20/2016 1003   GLUCOSE 90 06/19/2021 1108   GLUCOSE 139 12/20/2016 1003   BUN 11 11/14/2021 0000   BUN 10.1 12/20/2016 1003   CREATININE 0.6 11/14/2021 0000   CREATININE 0.52 (L) 06/19/2021 1108   CREATININE 0.7 12/20/2016 1003   CALCIUM 9.4 11/14/2021 0000   CALCIUM 9.5 12/20/2016 1003   GFRNONAA 105 09/19/2020 0923   GFRAA 121 09/19/2020 0923   Lab Results  Component Value Date   HGBA1C 7.6 (H) 10/16/2022   HGBA1C 8.6 (H) 07/16/2018   Lab Results  Component Value Date   INSULIN 19.0 06/18/2022   INSULIN 24.2 07/16/2018   Lab Results  Component Value  Date   TSH 3.690 07/16/2018   CBC    Component Value Date/Time   WBC 7.4 12/09/2019 0000   WBC 8.4 01/15/2018 0954   WBC 7.2 12/20/2016 1003   RBC 4.35 12/09/2019 0000   HGB 11.0 (A) 12/09/2019 0000   HGB 11.1 07/16/2018 1235   HGB 10.9 (L) 12/20/2016 1003   HCT 35 (A) 12/09/2019 0000   HCT 35.5 07/16/2018 1235   HCT 34.8 12/20/2016 1003   PLT 339 12/09/2019 0000   PLT 312 01/15/2018 0954   PLT 317 12/20/2016 1003   MCV 79 07/16/2018 1235   MCV 80.6 12/20/2016 1003   MCH 24.6 (L) 07/16/2018 1235   MCH 25.6 01/15/2018 0954   MCHC 31.3 (L) 07/16/2018 1235    MCHC 31.2 (L) 01/15/2018 0954   RDW 14.1 07/16/2018 1235   RDW 14.9 (H) 12/20/2016 1003   Iron Studies    Component Value Date/Time   IRON 38 (L) 12/20/2016 1003   TIBC 244 12/20/2016 1003   FERRITIN 590 (H) 12/20/2016 1003   IRONPCTSAT 16 (L) 12/20/2016 1003   Lipid Panel     Component Value Date/Time   CHOL 139 06/18/2022 1314   TRIG 66 06/18/2022 1314   HDL 48 06/18/2022 1314   CHOLHDL 2.9 06/18/2022 1314   LDLCALC 78 06/18/2022 1314   Hepatic Function Panel     Component Value Date/Time   PROT 6.5 11/14/2021 0000   PROT 7.0 12/20/2016 1003   ALBUMIN 3.9 11/14/2021 0000   ALBUMIN 4.3 06/19/2021 1108   ALBUMIN 3.7 12/20/2016 1003   AST 13 11/14/2021 0000   AST 15 12/20/2016 1003   ALT 10 11/14/2021 0000   ALT 13 12/20/2016 1003   ALKPHOS 92 11/14/2021 0000   ALKPHOS 87 12/20/2016 1003   BILITOT 0.3 06/19/2021 1108   BILITOT 0.49 12/20/2016 1003      Component Value Date/Time   TSH 3.690 07/16/2018 1235   Nutritional Lab Results  Component Value Date   VD25OH 48.5 10/16/2022   VD25OH 59.0 06/19/2021   VD25OH 47.7 01/11/2021    ASSOCIATED CONDITIONS ADDRESSED TODAY  ASSESSMENT AND PLAN  Problem List Items Addressed This Visit     Hypertension associated with diabetes    Hypertension Hypertension well controlled, improved, and no significant medication side effects noted.  Medication(s): losartan 100 mg daily ( Recently increased)    Lopressor 25 mg twice daily  BP Readings from Last 3 Encounters:  12/31/22 121/73  11/26/22 134/79  10/16/22 139/64   Lab Results  Component Value Date   CREATININE 0.6 11/14/2021   CREATININE 0.52 (L) 06/19/2021   CREATININE 0.57 01/11/2021  No results found for: "GFR"  Plan: Continue all antihypertensives at current dosages. Refilled losartan today x 30 days.  Continue to work on nutrition plan to promote weight loss and improve BP control.         Relevant Medications   losartan (COZAAR) 100 MG tablet    Semaglutide (RYBELSUS) 14 MG TABS   Diabetes mellitus - Primary    Type 2 Diabetes Mellitus with other specified complication, without long-term current use of insulin HgbA1c is not at goal. Last A1c was 7.6 CBGs: Not checking Reports needs test strips only refilled today.  No eye exam yet this year and needs to schedule.  Medication(s): Rybelsus 14 mg daily before breakfast  Lab Results  Component Value Date   HGBA1C 7.6 (H) 10/16/2022   HGBA1C 7.2 (H) 06/18/2022   HGBA1C 7.2 11/14/2021  Lab Results  Component Value Date   MICROALBUR <0.7 12/09/2019   LDLCALC 78 06/18/2022   CREATININE 0.6 11/14/2021  No results found for: "GFR"  Plan: Continue and refill Rybelsus 14 mg daily before breakfast Has been hesitant to try alternative injectable GLP-1 medications like Ozempic or Mounjaro., but has not yet met A1c goal of < 7 and weight loss on Rybelsus has been minimal.  Discussed Mounjaro today and she is going to look at the [employer] for more information and we can discuss further at the next visit.       Relevant Medications   losartan (COZAAR) 100 MG tablet   Semaglutide (RYBELSUS) 14 MG TABS   Glucose Blood (BLOOD GLUCOSE TEST STRIPS) STRP   Morbid obesity   Relevant Medications   Semaglutide (RYBELSUS) 14 MG TABS   Vitamin D deficiency    Vitamin D Deficiency Vitamin D is not at goal of 50.  Most recent vitamin D level was 48.5. She is on  prescription ergocalciferol 50,000 IU weekly. Lab Results  Component Value Date   VD25OH 48.5 10/16/2022   VD25OH 59.0 06/19/2021   VD25OH 47.7 01/11/2021   Plan: Continue and refill  prescription ergocalciferol 50,000 IU weekly Low vitamin D levels can be associated with adiposity and may result in leptin resistance and weight gain. Also associated with fatigue. Currently on vitamin D supplementation without any adverse effects.        Relevant Medications   Vitamin D, Ergocalciferol, (DRISDOL) 1.25 MG (50000 UNIT) CAPS  capsule   BMI 40.0-44.9, adult Current BMI 48.0   Relevant Medications   Semaglutide (RYBELSUS) 14 MG TABS    TREATMENT PLAN FOR OBESITY:  Recommended Dietary Goals  [name] is currently in the action stage of change. As such, her goal is to continue weight management plan. She has agreed to the Category 3 Plan and keeping a food journal and adhering to recommended goals of 1500-1600 calories and 90+ grams protein daily.  Behavioral Intervention  We discussed the following Behavioral Modification Strategies today: increasing lean protein intake, decreasing simple carbohydrates , increasing vegetables, avoiding skipping meals, increasing water intake, work on tracking and journaling calories using tracking application, continue to practice mindfulness when eating, and planning for success.  Additional resources provided today: Dining out handout if grab and go to help meet protein and calorie requirements more consistently.   Recommended Physical Activity Goals  Deliah has been advised to work up to 150 minutes of moderate intensity aerobic activity a week and strengthening exercises 2-3 times per week for cardiovascular health, weight loss maintenance and preservation of muscle mass.   She has agreed to Continue current level of physical activity  and try chair yoga 2 times weekly   Pharmacotherapy We discussed various medication options to help Arlayne with her weight loss efforts and we both agreed to continue Rybelsus for Type 2 diabetes and continue to work on nutritional and behavioral strategies to promote weight loss.  .    Return in about 4 weeks (around 01/28/2023).[name] She was informed of the importance of frequent follow up visits to maximize her success with intensive lifestyle modifications for her multiple health conditions.   ATTESTASTION STATEMENTS:  Reviewed by clinician on day of visit: allergies, medications, problem list, medical history, surgical history, family  history, social history, and previous encounter notes.   I have personally spent 45 minutes total time today in preparation, patient care, nutritional counseling and documentation for this visit, including the following: review  of clinical lab tests; review of medical tests/procedures/services.      Berlene Dixson, PA-C

## 2022-12-31 NOTE — Assessment & Plan Note (Signed)
Hypertension Hypertension well controlled, improved, and no significant medication side effects noted.  Medication(s): losartan 100 mg daily ( Recently increased)    Lopressor 25 mg twice daily  BP Readings from Last 3 Encounters:  12/31/22 121/73  11/26/22 134/79  10/16/22 139/64   Lab Results  Component Value Date   CREATININE 0.6 11/14/2021   CREATININE 0.52 (L) 06/19/2021   CREATININE 0.57 01/11/2021   No results found for: "GFR"  Plan: Continue all antihypertensives at current dosages. Refilled losartan today x 30 days.  Continue to work on nutrition plan to promote weight loss and improve BP control.

## 2022-12-31 NOTE — Assessment & Plan Note (Signed)
Type 2 Diabetes Mellitus with other specified complication, without long-term current use of insulin HgbA1c is not at goal. Last A1c was 7.6 CBGs: Not checking Reports needs test strips only refilled today.  No eye exam yet this year and needs to schedule.  Medication(s): Rybelsus 14 mg daily before breakfast  Lab Results  Component Value Date   HGBA1C 7.6 (H) 10/16/2022   HGBA1C 7.2 (H) 06/18/2022   HGBA1C 7.2 11/14/2021   Lab Results  Component Value Date   MICROALBUR <0.7 12/09/2019   LDLCALC 78 06/18/2022   CREATININE 0.6 11/14/2021   No results found for: "GFR"  Plan: Continue and refill Rybelsus 14 mg daily before breakfast Has been hesitant to try alternative injectable GLP-1 medications like Ozempic or Mounjaro., but has not yet met A1c goal of < 7 and weight loss on Rybelsus has been minimal.  Discussed Mounjaro today and she is going to look at the [employer] for more information and we can discuss further at the next visit.

## 2022-12-31 NOTE — Assessment & Plan Note (Signed)
Vitamin D Deficiency Vitamin D is not at goal of 50.  Most recent vitamin D level was 48.5. She is on  prescription ergocalciferol 50,000 IU weekly. Lab Results  Component Value Date   VD25OH 48.5 10/16/2022   VD25OH 59.0 06/19/2021   VD25OH 47.7 01/11/2021    Plan: Continue and refill  prescription ergocalciferol 50,000 IU weekly Low vitamin D levels can be associated with adiposity and may result in leptin resistance and weight gain. Also associated with fatigue. Currently on vitamin D supplementation without any adverse effects.

## 2023-01-01 NOTE — Telephone Encounter (Signed)
Scheduled 03/07/23 with Dr. Duke Salvia

## 2023-01-01 NOTE — Telephone Encounter (Signed)
Rx request sent to pharmacy.  

## 2023-01-26 ENCOUNTER — Other Ambulatory Visit (INDEPENDENT_AMBULATORY_CARE_PROVIDER_SITE_OTHER): Payer: Self-pay | Admitting: Physician Assistant

## 2023-01-26 DIAGNOSIS — E1159 Type 2 diabetes mellitus with other circulatory complications: Secondary | ICD-10-CM

## 2023-01-28 ENCOUNTER — Ambulatory Visit (INDEPENDENT_AMBULATORY_CARE_PROVIDER_SITE_OTHER): Payer: 59 | Admitting: Family Medicine

## 2023-01-28 ENCOUNTER — Other Ambulatory Visit (INDEPENDENT_AMBULATORY_CARE_PROVIDER_SITE_OTHER): Payer: Self-pay | Admitting: Physician Assistant

## 2023-01-28 ENCOUNTER — Encounter (INDEPENDENT_AMBULATORY_CARE_PROVIDER_SITE_OTHER): Payer: Self-pay | Admitting: Family Medicine

## 2023-01-28 VITALS — BP 126/71 | HR 58 | Temp 98.0°F | Ht 68.0 in | Wt 319.0 lb

## 2023-01-28 DIAGNOSIS — E1159 Type 2 diabetes mellitus with other circulatory complications: Secondary | ICD-10-CM | POA: Diagnosis not present

## 2023-01-28 DIAGNOSIS — E559 Vitamin D deficiency, unspecified: Secondary | ICD-10-CM

## 2023-01-28 DIAGNOSIS — Z7985 Long-term (current) use of injectable non-insulin antidiabetic drugs: Secondary | ICD-10-CM

## 2023-01-28 DIAGNOSIS — I152 Hypertension secondary to endocrine disorders: Secondary | ICD-10-CM

## 2023-01-28 DIAGNOSIS — E669 Obesity, unspecified: Secondary | ICD-10-CM

## 2023-01-28 DIAGNOSIS — E1169 Type 2 diabetes mellitus with other specified complication: Secondary | ICD-10-CM

## 2023-01-28 DIAGNOSIS — Z6841 Body Mass Index (BMI) 40.0 and over, adult: Secondary | ICD-10-CM

## 2023-01-28 MED ORDER — TIRZEPATIDE 12.5 MG/0.5ML ~~LOC~~ SOAJ
12.5000 mg | SUBCUTANEOUS | 0 refills | Status: DC
Start: 2023-01-28 — End: 2023-02-26
  Filled 2023-01-28 – 2023-02-06 (×2): qty 2, 28d supply, fill #0

## 2023-01-28 MED ORDER — VITAMIN D (ERGOCALCIFEROL) 1.25 MG (50000 UNIT) PO CAPS
50000.0000 [IU] | ORAL_CAPSULE | ORAL | 0 refills | Status: DC
Start: 2023-01-28 — End: 2023-02-26

## 2023-01-28 MED ORDER — LOSARTAN POTASSIUM 100 MG PO TABS: 100.0000 mg | ORAL_TABLET | Freq: Every day | ORAL | 0 refills | Status: DC

## 2023-01-28 NOTE — Progress Notes (Signed)
Chief Complaint:   OBESITY Erin [name] is here to discuss her progress with her obesity treatment plan along with follow-up of her obesity related diagnoses. Erin [name] is on the Category 3 Plan and keeping a food journal and adhering to recommended goals of 1500-1600 calories and 90+ grams of protein and states she is following her eating plan approximately 20% of the time. Erin [name] states she is walking for 15-20 minutes 4 times per week.  Today's visit was #: 13 Starting weight: 330 lbs Starting date: 07/16/2018 Today's weight: 319 lbs Today's date: 01/28/2023 Total lbs lost to date: 11 Total lbs lost since last in-office visit: 0  Interim History: Erin [name] has been struggling to maintain her weight loss.  She is up 24 pounds since her lowest weight in December 2022.  Subjective:   1. Vitamin D deficiency Erin [name] is on vitamin D prescription with no signs of over replacement.  She requests a refill today.  I discussed labs with the patient today.  2. Type 2 diabetes mellitus with other specified complication, without long-term current use of insulin (HCC) Erin [name]'s A1c has been worsening at the same time she has been gaining weight.  I discussed labs with the patient today.  3. Hypertension associated with diabetes (HCC) Erin [name]'s blood pressure is well-controlled today.  She denies any side effects with losartan.  Her weight is creeping up however.  Assessment/Plan:   1. Vitamin D deficiency Erin [name] will continue prescription vitamin D, and we will refill for 1 month.  - Vitamin D, Ergocalciferol, (DRISDOL) 1.25 MG (50000 UNIT) CAPS capsule; Take 1 capsule (50,000 Units total) by mouth every 7 (seven) days.  Dispense: 5 capsule; Refill: 0  2. Type 2 diabetes mellitus with other specified complication, without long-term current use of insulin (HCC) Erin [name] agreed to discontinue Rybelsus, and start Mounjaro 12.5 mg once weekly with no refills.  - tirzepatide (MOUNJARO) 12.5 MG/0.5ML Pen;  Inject 12.5 mg into the skin once a week.  Dispense: 6 mL; Refill: 0  3. Hypertension associated with diabetes (HCC) We will refill losartan for 1 month.  Erin [name] will continue to work on her diet, exercise, and weight loss to help control her blood pressure.  - losartan (COZAAR) 100 MG tablet; Take 1 tablet (100 mg total) by mouth daily.  Dispense: 30 tablet; Refill: 0  4. BMI 45.0-49.9, adult (HCC)  5. Obesity- Beginning BMI 50.18 Erin [name] is currently in the action stage of change. As such, her goal is to continue with weight loss efforts. She has agreed to keeping a food journal and adhering to recommended goals of 1500-1600 calories and 90+ grams of protein daily.   We reviewed her weight graph and noticed weight gain associated when she retired. She is to work on getting back into a routine to help with weight loss.   Exercise goals: As is.   Behavioral modification strategies: increasing lean protein intake, no skipping meals, meal planning and cooking strategies, and ways to avoid night time snacking.  Erin [name] has agreed to follow-up with our clinic in 3 to 4 weeks. She was informed of the importance of frequent follow-up visits to maximize her success with intensive lifestyle modifications for her multiple health conditions.   Objective:   Blood pressure 126/71, pulse (!) 58, temperature 98 F (36.7 C), height 5\' 8"  (1.727 m), weight (!) 319 lb (144.7 kg), SpO2 98 %. Body mass index is 48.5 kg/m.  Lab Results  Component Value Date   CREATININE 0.6 11/14/2021  BUN 11 11/14/2021   NA 141 11/14/2021   K 4.3 11/14/2021   CL 106 11/14/2021   CO2 30 (A) 11/14/2021   Lab Results  Component Value Date   ALT 10 11/14/2021   AST 13 11/14/2021   ALKPHOS 92 11/14/2021   BILITOT 0.3 06/19/2021   Lab Results  Component Value Date   HGBA1C 7.6 (H) 10/16/2022   HGBA1C 7.2 (H) 06/18/2022   HGBA1C 7.2 11/14/2021   HGBA1C 6.8 (H) 06/19/2021   HGBA1C 6.6 (H) 01/11/2021   Lab  Results  Component Value Date   INSULIN 19.0 06/18/2022   INSULIN 13.9 06/19/2021   INSULIN 12.5 01/11/2021   INSULIN 7.6 09/19/2020   INSULIN 17.3 05/30/2020   Lab Results  Component Value Date   TSH 3.690 07/16/2018   Lab Results  Component Value Date   CHOL 139 06/18/2022   HDL 48 06/18/2022   LDLCALC 78 06/18/2022   TRIG 66 06/18/2022   CHOLHDL 2.9 06/18/2022   Lab Results  Component Value Date   VD25OH 48.5 10/16/2022   VD25OH 59.0 06/19/2021   VD25OH 47.7 01/11/2021   Lab Results  Component Value Date   WBC 7.4 12/09/2019   HGB 11.0 (A) 12/09/2019   HCT 35 (A) 12/09/2019   MCV 79 07/16/2018   PLT 339 12/09/2019   Lab Results  Component Value Date   IRON 38 (L) 12/20/2016   TIBC 244 12/20/2016   FERRITIN 590 (H) 12/20/2016   Attestation Statements:   Reviewed by clinician on day of visit: allergies, medications, problem list, medical history, surgical history, family history, social history, and previous encounter notes.   I, Jerelene Swaggerty, am acting as transcriptionist for Quillian Quince, MD.  I have reviewed the above documentation for accuracy and completeness, and I agree with the above. -  Quillian Quince, MD

## 2023-01-29 ENCOUNTER — Other Ambulatory Visit (HOSPITAL_COMMUNITY): Payer: Self-pay

## 2023-01-29 ENCOUNTER — Encounter: Payer: Self-pay | Admitting: Hematology

## 2023-02-04 ENCOUNTER — Telehealth (INDEPENDENT_AMBULATORY_CARE_PROVIDER_SITE_OTHER): Payer: Self-pay | Admitting: Family Medicine

## 2023-02-04 ENCOUNTER — Other Ambulatory Visit (HOSPITAL_COMMUNITY): Payer: Self-pay

## 2023-02-04 NOTE — Telephone Encounter (Signed)
I called pharmacy and [facility] informed me that the RX was ready for pick up.  I called the patient and informed her of what the pharmacy told me.  Patient explained that she asked Dr. Dalbert Garnet to send it to [facility] in [place].  I informed the patient that she could call the pharmacy and ask for them to transfer the RX.  Also, I would let Dr. Dalbert Garnet know of what is going on.  Patient said that she has not had the RX in 5 days.

## 2023-02-04 NOTE — Telephone Encounter (Signed)
5/28 Patient stated that Dr. Dalbert Garnet put a prescription for [facility] but then the prescription was cancelled. Patient is wanting a update on medication and has been out for 5 days. Erin [name]

## 2023-02-06 ENCOUNTER — Other Ambulatory Visit (HOSPITAL_COMMUNITY): Payer: Self-pay

## 2023-02-24 ENCOUNTER — Other Ambulatory Visit (INDEPENDENT_AMBULATORY_CARE_PROVIDER_SITE_OTHER): Payer: Self-pay | Admitting: Family Medicine

## 2023-02-24 DIAGNOSIS — I152 Hypertension secondary to endocrine disorders: Secondary | ICD-10-CM

## 2023-02-26 ENCOUNTER — Encounter (INDEPENDENT_AMBULATORY_CARE_PROVIDER_SITE_OTHER): Payer: Self-pay | Admitting: Family Medicine

## 2023-02-26 ENCOUNTER — Ambulatory Visit (INDEPENDENT_AMBULATORY_CARE_PROVIDER_SITE_OTHER): Payer: 59 | Admitting: Family Medicine

## 2023-02-26 ENCOUNTER — Other Ambulatory Visit (HOSPITAL_COMMUNITY): Payer: Self-pay

## 2023-02-26 VITALS — BP 113/68 | HR 6 | Temp 97.6°F | Ht 68.0 in | Wt 315.0 lb

## 2023-02-26 DIAGNOSIS — E1159 Type 2 diabetes mellitus with other circulatory complications: Secondary | ICD-10-CM

## 2023-02-26 DIAGNOSIS — E559 Vitamin D deficiency, unspecified: Secondary | ICD-10-CM

## 2023-02-26 DIAGNOSIS — Z6841 Body Mass Index (BMI) 40.0 and over, adult: Secondary | ICD-10-CM

## 2023-02-26 DIAGNOSIS — E1169 Type 2 diabetes mellitus with other specified complication: Secondary | ICD-10-CM | POA: Diagnosis not present

## 2023-02-26 DIAGNOSIS — E669 Obesity, unspecified: Secondary | ICD-10-CM

## 2023-02-26 DIAGNOSIS — I152 Hypertension secondary to endocrine disorders: Secondary | ICD-10-CM | POA: Diagnosis not present

## 2023-02-26 DIAGNOSIS — Z7985 Long-term (current) use of injectable non-insulin antidiabetic drugs: Secondary | ICD-10-CM

## 2023-02-26 MED ORDER — LOSARTAN POTASSIUM 100 MG PO TABS: 100.0000 mg | ORAL_TABLET | Freq: Every day | ORAL | 0 refills | Status: DC

## 2023-02-26 MED ORDER — TIRZEPATIDE 12.5 MG/0.5ML ~~LOC~~ SOAJ
12.5000 mg | SUBCUTANEOUS | 0 refills | Status: DC
Start: 2023-02-26 — End: 2023-03-27
  Filled 2023-02-26: qty 6, 84d supply, fill #0
  Filled 2023-03-06: qty 2, 28d supply, fill #0

## 2023-02-26 MED ORDER — VITAMIN D (ERGOCALCIFEROL) 1.25 MG (50000 UNIT) PO CAPS
50000.0000 [IU] | ORAL_CAPSULE | ORAL | 0 refills | Status: DC
Start: 2023-02-26 — End: 2023-03-27

## 2023-02-27 NOTE — Progress Notes (Signed)
.smr  Office: [phone]  /  Fax: [phone]  WEIGHT SUMMARY AND BIOMETRICS  Anthropometric Measurements Height: 5\' 8"  (1.727 m) Weight: (!) 315 lb (142.9 kg) BMI (Calculated): 47.91 Weight at Last Visit: 319 lb Weight Lost Since Last Visit: 4 lb Weight Gained Since Last Visit: 0 Starting Weight: 330 lb Total Weight Loss (lbs): 15 lb (6.804 kg)   Body Composition  Body Fat %: 55.1 % Fat Mass (lbs): 174 lbs Muscle Mass (lbs): 134.6 lbs Total Body Water (lbs): 108.8 lbs Visceral Fat Rating : 21   Other Clinical Data Fasting: Yes Labs: No Today's Visit #: 14 Starting Date: 07/26/18    Chief Complaint: OBESITY   Discussed the use of AI scribe software for clinical note transcription with the patient, who gave verbal consent to proceed.  History of Present Illness   Eddis, a 62 year old individual with obesity, vitamin D deficiency, hypertension, and diabetes, reports a weight loss of four pounds over the past month. She has been adhering to a dietary plan of 1500-1600 calories with approximately 90 grams of protein about 40% of the time. She has also incorporated walking for ten minutes, four times per week into her routine.  Zameria has been taking vitamin D, losartan, and Mounjaro, and reports an increase in appetite since starting Mounjaro. She has taken three doses so far and has experienced a slight headache after each dose. She also reports a sensation of a knot in her throat, which lasted for a day or two after the third dose. She has noticed some bruising on the left side after injection, but not on the right.  Previously, she was on Rybelsus, which she reports made her feel full faster and unable to finish meals. This sensation has not been replicated with Mounjaro. Despite this, she has expressed a desire to continue with Mounjaro to fully assess its effects.  Jayden also reports a history of low blood sugar episodes, describing symptoms of cold sweats and  shaking during these instances. She has been monitoring her blood sugar levels but has not received recent results. She has been prescribed metformin, Rybelsus and Metformin for diabetes management.  In terms of physical activity, she has been aiming for 5000 steps per day, as monitored by an Apple watch. She reports standing and moving around when the watch indicates prolonged periods of sitting.          PHYSICAL EXAM:  Blood pressure 113/68, pulse (!) 6, temperature 97.6 F (36.4 C), height 5\' 8"  (1.727 m), weight (!) 315 lb (142.9 kg), SpO2 98 %. Body mass index is 47.9 kg/m.  DIAGNOSTIC DATA REVIEWED:  BMET    Component Value Date/Time   NA 141 11/14/2021 0000   NA 141 12/20/2016 1003   K 4.3 11/14/2021 0000   K 4.1 12/20/2016 1003   CL 106 11/14/2021 0000   CO2 30 (A) 11/14/2021 0000   CO2 28 12/20/2016 1003   GLUCOSE 90 06/19/2021 1108   GLUCOSE 139 12/20/2016 1003   BUN 11 11/14/2021 0000   BUN 10.1 12/20/2016 1003   CREATININE 0.6 11/14/2021 0000   CREATININE 0.52 (L) 06/19/2021 1108   CREATININE 0.7 12/20/2016 1003   CALCIUM 9.4 11/14/2021 0000   CALCIUM 9.5 12/20/2016 1003   GFRNONAA 105 09/19/2020 0923   GFRAA 121 09/19/2020 0923   Lab Results  Component Value Date   HGBA1C 7.6 (H) 10/16/2022   HGBA1C 8.6 (H) 07/16/2018   Lab Results  Component Value Date   INSULIN 19.0 06/18/2022  INSULIN 24.2 07/16/2018   Lab Results  Component Value Date   TSH 3.690 07/16/2018   CBC    Component Value Date/Time   WBC 7.4 12/09/2019 0000   WBC 8.4 01/15/2018 0954   WBC 7.2 12/20/2016 1003   RBC 4.35 12/09/2019 0000   HGB 11.0 (A) 12/09/2019 0000   HGB 11.1 07/16/2018 1235   HGB 10.9 (L) 12/20/2016 1003   HCT 35 (A) 12/09/2019 0000   HCT 35.5 07/16/2018 1235   HCT 34.8 12/20/2016 1003   PLT 339 12/09/2019 0000   PLT 312 01/15/2018 0954   PLT 317 12/20/2016 1003   MCV 79 07/16/2018 1235   MCV 80.6 12/20/2016 1003   MCH 24.6 (L) 07/16/2018 1235   MCH  25.6 01/15/2018 0954   MCHC 31.3 (L) 07/16/2018 1235   MCHC 31.2 (L) 01/15/2018 0954   RDW 14.1 07/16/2018 1235   RDW 14.9 (H) 12/20/2016 1003   Iron Studies    Component Value Date/Time   IRON 38 (L) 12/20/2016 1003   TIBC 244 12/20/2016 1003   FERRITIN 590 (H) 12/20/2016 1003   IRONPCTSAT 16 (L) 12/20/2016 1003   Lipid Panel     Component Value Date/Time   CHOL 139 06/18/2022 1314   TRIG 66 06/18/2022 1314   HDL 48 06/18/2022 1314   CHOLHDL 2.9 06/18/2022 1314   LDLCALC 78 06/18/2022 1314   Hepatic Function Panel     Component Value Date/Time   PROT 6.5 11/14/2021 0000   PROT 7.0 12/20/2016 1003   ALBUMIN 3.9 11/14/2021 0000   ALBUMIN 4.3 06/19/2021 1108   ALBUMIN 3.7 12/20/2016 1003   AST 13 11/14/2021 0000   AST 15 12/20/2016 1003   ALT 10 11/14/2021 0000   ALT 13 12/20/2016 1003   ALKPHOS 92 11/14/2021 0000   ALKPHOS 87 12/20/2016 1003   BILITOT 0.3 06/19/2021 1108   BILITOT 0.49 12/20/2016 1003      Component Value Date/Time   TSH 3.690 07/16/2018 1235   Nutritional Lab Results  Component Value Date   VD25OH 48.5 10/16/2022   VD25OH 59.0 06/19/2021   VD25OH 47.7 01/11/2021     Assessment and Plan    Obesity: Lost 4 pounds in the last month. Following a journaling plan of 1500-1600 calories with 90 grams of protein approximately 40% of the time. Walking for 10 minutes four times per week. -Continue current diet and exercise regimen.  Type 2 Diabetes Mellitus: Recently switched from Rybelsus to [facility]. Patient reports increased appetite and mild side effects (headache, throat tightness) after starting Mounjaro. Discussed that full effect of Mounjaro will not be seen until after 4 doses due to its mechanism of action. -Continue Mounjaro 12.5mg  weekly. -Check A1C in 2 months (after 3 months on Mounjaro).  Hypertension: Well controlled on current regimen. -Continue Losartan, refill prescription.  Vitamin D Deficiency: Last level was near  goal. -Continue Vitamin D 50,000 units weekly, refill prescription.        She was informed of the importance of frequent follow up visits to maximize her success with intensive lifestyle modifications for her multiple health conditions.    Quillian Quince, MD

## 2023-03-06 ENCOUNTER — Other Ambulatory Visit (HOSPITAL_COMMUNITY): Payer: Self-pay

## 2023-03-07 ENCOUNTER — Encounter (HOSPITAL_BASED_OUTPATIENT_CLINIC_OR_DEPARTMENT_OTHER): Payer: Self-pay | Admitting: Cardiovascular Disease

## 2023-03-07 ENCOUNTER — Ambulatory Visit (HOSPITAL_BASED_OUTPATIENT_CLINIC_OR_DEPARTMENT_OTHER): Payer: 59 | Admitting: Cardiovascular Disease

## 2023-03-07 VITALS — BP 126/72 | HR 62 | Ht 68.0 in | Wt 321.2 lb

## 2023-03-07 DIAGNOSIS — E1159 Type 2 diabetes mellitus with other circulatory complications: Secondary | ICD-10-CM

## 2023-03-07 DIAGNOSIS — R600 Localized edema: Secondary | ICD-10-CM

## 2023-03-07 DIAGNOSIS — E7800 Pure hypercholesterolemia, unspecified: Secondary | ICD-10-CM

## 2023-03-07 DIAGNOSIS — I152 Hypertension secondary to endocrine disorders: Secondary | ICD-10-CM | POA: Diagnosis not present

## 2023-03-07 NOTE — Progress Notes (Signed)
Cardiology Office Note:  .    Date:  03/07/2023  ID:  Erin [name], DOB 12-27-1960, MRN [id number] PCP: Shon Hale, MD  [facility] Providers Cardiologist:  None     History of Present Illness: .    Erin [name] is a 62 y.o. female with diabetes, hypertension, hyperlipidemia, PVCs, venous insufficiency and morbid obesity who presents for follow up.  She was seen in clinic 06/13/16 for intermittent episodes of shortness of breath and chest discomfort.  The episodes occur randomly and not particularly with exertion. She was referred for an ETT 06/2016 that was negative for ischemia.  She achieved 7.6 METS on a Bruce protocol and was noted to have monomorphic PVCs during recovery.  She had lower extremity edema and was referred for an echo 11/07/16 that revealed LVEF 55-60% and was otherwise unremarkable.  She had a coronary CT-a 07/2028 that had a calcium score of 0 and no coronary artery disease.  She was started on metoprolol for PVCs.   At her visit 08/2021 she had some lower extremity edema that was thought to be due to venous insufficiency.  She also had chest discomfort thought to be due to GERD.  She has been working with the healthy weight and wellness clinic.  Today, she states she is feeling good. Her blood pressure is well controlled in the office today; she doesn't monitor her pressures at home. Since retiring, she has been working on increasing her exercise by walking in the evening, with a goal of 5,000 steps. She usually feels well without anginal symptoms or shortness of breath. However, she is limited by right foot pain. Frequently she notices swelling in her ankles, mostly after sitting a lot. She will use compression socks if going out on longer trips, which does help. Since her retirement she admits to not drinking as much water. She continues to work with healthy weight and wellness. Reports that she has been switched from Rybelsus to [facility]. Unfortunately she  states the [facility] has not yet affected her appetite like she experienced with Rybelsus. She denies any palpitations, chest pain, lightheadedness, headaches, syncope, orthopnea, or PND.  ROS:  Please see the history of present illness. All other systems are reviewed and negative.  (+) Right foot pain (+) Bilateral ankle swelling  Studies Reviewed: [name]   EKG Interpretation Date/Time:  Friday March 07 2023 11:14:39 EDT Ventricular Rate:  62 PR Interval:  158 QRS Duration:  100 QT Interval:  428 QTC Calculation: 434 R Axis:   22  Text Interpretation: Normal sinus rhythm Low voltage QRS No previous ECGs available Confirmed by Chilton Si ([id number]) on 03/07/2023 11:46:45 AM    Risk Assessment/Calculations:             Physical Exam:    VS:  BP 126/72 (BP Location: Left Arm, Patient Position: Sitting, Cuff Size: Large)   Pulse 62   Ht 5\' 8"  (1.727 m)   Wt (!) 321 lb 3.2 oz (145.7 kg)   BMI 48.84 kg/m  , BMI Body mass index is 48.84 kg/m. GENERAL:  Well appearing HEENT: Pupils equal round and reactive, fundi not visualized, oral mucosa unremarkable NECK:  No jugular venous distention, waveform within normal limits, carotid upstroke brisk and symmetric, no bruits, no thyromegaly LUNGS:  Clear to auscultation bilaterally HEART:  RRR.  PMI not displaced or sustained,S1 and S2 within normal limits, no S3, no S4, no clicks, no rubs, no murmurs ABD:  Flat, positive bowel sounds normal  in frequency in pitch, no bruits, no rebound, no guarding, no midline pulsatile mass, no hepatomegaly, no splenomegaly EXT:  2 plus pulses throughout, no edema, no cyanosis no clubbing SKIN:  No rashes no nodules NEURO:  Cranial nerves II through XII grossly intact, motor grossly intact throughout PSYCH:  Cognitively intact, oriented to person place and time  Wt Readings from Last 3 Encounters:  03/07/23 (!) 321 lb 3.2 oz (145.7 kg)  02/26/23 (!) 315 lb (142.9 kg)  01/28/23 (!) 319 lb (144.7 kg)      ASSESSMENT AND PLAN: .    # Premature Ventricular Contractions (PVCs): No recent issues reported. Currently managed with Metoprolol. -Continue Metoprolol.  # Hypertension: Well-controlled with Losartan and Metoprolol. Blood pressure at visit was 126/72. -Continue Losartan and Metoprolol.  # Hyperlipidemia: Well-controlled with Rosuvastatin. -Continue Rosuvastatin.  # Weight Management: Currently on Mounjaro for weight loss, with plans to increase dose due to lack of appetite suppression. -Continue Mounjaro as directed by weight and wellness doctor. - Continue f/u with [facility]  # Lower Extremity Edema: Noted mild fluid accumulation in ankles, possibly due to prolonged sitting. -Increase water intake. -Elevate legs during prolonged sitting. -Consider use of compression socks during long periods of standing, sitting, or travel. -Consider as-needed diuretic if edema persists despite these measures.        Dispo:  FU with Lenita Peregrina C. Duke Salvia, MD, [facility] in 1 year.  I,Mathew Stumpf,acting as a Neurosurgeon for Chilton Si, MD.,have documented all relevant documentation on the behalf of Chilton Si, MD,as directed by  Chilton Si, MD while in the presence of Chilton Si, MD.  I, Aleksandra Raben C. Duke Salvia, MD have reviewed all documentation for this visit.  The documentation of the exam, diagnosis, procedures, and orders on 03/07/2023 are all accurate and complete.   Signed, Chilton Si, MD

## 2023-03-07 NOTE — Progress Notes (Deleted)
  Cardiology Office Note:  .   Date:  03/07/2023  ID:  Erin [name], DOB 1961/02/03, MRN [id number] PCP: Shon Hale, MD  [facility] Providers Cardiologist:  None { Click to update primary MD,subspecialty MD or APP then REFRESH:1}   History of Present Illness: .   Erin [name] is a 62 y.o. female with diabetes, hypertension, hyperlipidemia, PVCs, venous insufficiency and morbid obesity who presents for follow up.  She was seen in clinic 06/13/16 for intermittent episodes of shortness of breath and chest discomfort.  The episodes occur randomly and not particularly with exertion. She was referred for an ETT 06/2016 that was negative for ischemia.  She achieved 7.6 METS on a Bruce protocol and was noted to have monomorphic PVCs during recovery.  She had lower extremity edema and was referred for an echo 11/07/16 that revealed LVEF 55-60% and was otherwise unremarkable.  She had a coronary CT-a 07/2028 that had a calcium score of 0 and no coronary artery disease.  She was started on metoprolol for PVCs.   At her visit 08/2021 she had some lower extremity edema that was thought to be due to venous insufficiency.  She also had chest discomfort thought to be due to GERD.  She has been working with the healthy weight and wellness clinic.  ROS: ***  Studies Reviewed: .        *** Risk Assessment/Calculations:   {Does this patient have ATRIAL FIBRILLATION?[phone]} No BP recorded.  {Refresh Note OR Click here to enter BP  :1}***       Physical Exam:   VS:  There were no vitals taken for this visit.   Wt Readings from Last 3 Encounters:  02/26/23 (!) 315 lb (142.9 kg)  01/28/23 (!) 319 lb (144.7 kg)  12/31/22 (!) 316 lb (143.3 kg)    GEN: Well nourished, well developed in no acute distress NECK: No JVD; No carotid bruits CARDIAC: ***RRR, no murmurs, rubs, gallops RESPIRATORY:  Clear to auscultation without rales, wheezing or rhonchi  ABDOMEN: Soft, non-tender,  non-distended EXTREMITIES:  No edema; No deformity   ASSESSMENT AND PLAN: .   ***    {Are you ordering a CV Procedure (e.g. stress test, cath, DCCV, TEE, etc)?   Press F2        :[id number]}  Dispo: ***  Signed, Chilton Si, MD

## 2023-03-07 NOTE — Patient Instructions (Signed)
  Medication Instructions:  Your physician recommends that you continue on your current medications as directed. Please refer to the Current Medication list given to you today.    *If you need a refill on your cardiac medications before your next appointment, please call your pharmacy*   Lab Work: NONE   Testing/Procedures: NONE   Follow-Up: At [facility], you and your health needs are our priority.  As part of our continuing mission to provide you with exceptional heart care, we have created designated Provider Care Teams.  These Care Teams include your primary Cardiologist (physician) and Advanced Practice Providers (APPs -  Physician Assistants and Nurse Practitioners) who all work together to provide you with the care you need, when you need it.   We recommend signing up for the patient portal called "MyChart".  Sign up information is provided on this After Visit Summary.  MyChart is used to connect with patients for Virtual Visits (Telemedicine).  Patients are able to view lab/test results, encounter notes, upcoming appointments, etc.  Non-urgent messages can be sent to your provider as well.   To learn more about what you can do with MyChart, go to [email or web address].     Your next appointment:   12 month(s)   Provider:   Tiffany Ashe, MD or Caitlin Walker, NP        

## 2023-03-26 ENCOUNTER — Other Ambulatory Visit (INDEPENDENT_AMBULATORY_CARE_PROVIDER_SITE_OTHER): Payer: Self-pay | Admitting: Family Medicine

## 2023-03-26 DIAGNOSIS — I152 Hypertension secondary to endocrine disorders: Secondary | ICD-10-CM

## 2023-03-27 ENCOUNTER — Other Ambulatory Visit (HOSPITAL_COMMUNITY): Payer: Self-pay

## 2023-03-27 ENCOUNTER — Ambulatory Visit (INDEPENDENT_AMBULATORY_CARE_PROVIDER_SITE_OTHER): Payer: 59 | Admitting: Family Medicine

## 2023-03-27 ENCOUNTER — Encounter (INDEPENDENT_AMBULATORY_CARE_PROVIDER_SITE_OTHER): Payer: Self-pay | Admitting: Family Medicine

## 2023-03-27 VITALS — BP 119/65 | HR 66 | Temp 97.9°F | Ht 68.0 in | Wt 318.0 lb

## 2023-03-27 DIAGNOSIS — E1159 Type 2 diabetes mellitus with other circulatory complications: Secondary | ICD-10-CM | POA: Diagnosis not present

## 2023-03-27 DIAGNOSIS — E669 Obesity, unspecified: Secondary | ICD-10-CM

## 2023-03-27 DIAGNOSIS — E559 Vitamin D deficiency, unspecified: Secondary | ICD-10-CM | POA: Diagnosis not present

## 2023-03-27 DIAGNOSIS — E1169 Type 2 diabetes mellitus with other specified complication: Secondary | ICD-10-CM

## 2023-03-27 DIAGNOSIS — Z6841 Body Mass Index (BMI) 40.0 and over, adult: Secondary | ICD-10-CM

## 2023-03-27 DIAGNOSIS — Z7985 Long-term (current) use of injectable non-insulin antidiabetic drugs: Secondary | ICD-10-CM

## 2023-03-27 DIAGNOSIS — I152 Hypertension secondary to endocrine disorders: Secondary | ICD-10-CM | POA: Diagnosis not present

## 2023-03-27 MED ORDER — LOSARTAN POTASSIUM 100 MG PO TABS
100.0000 mg | ORAL_TABLET | Freq: Every day | ORAL | 0 refills | Status: DC
Start: 2023-03-27 — End: 2023-04-22

## 2023-03-27 MED ORDER — TIRZEPATIDE 12.5 MG/0.5ML ~~LOC~~ SOAJ
12.5000 mg | SUBCUTANEOUS | 0 refills | Status: DC
Start: 2023-03-27 — End: 2023-04-22
  Filled 2023-03-27: qty 2, 28d supply, fill #0

## 2023-03-27 MED ORDER — VITAMIN D (ERGOCALCIFEROL) 1.25 MG (50000 UNIT) PO CAPS
50000.0000 [IU] | ORAL_CAPSULE | ORAL | 0 refills | Status: DC
Start: 2023-03-27 — End: 2023-04-22

## 2023-03-28 ENCOUNTER — Other Ambulatory Visit: Payer: Self-pay | Admitting: Cardiovascular Disease

## 2023-03-31 NOTE — Progress Notes (Unsigned)
Chief Complaint:   OBESITY Erin [name] is here to discuss her progress with her obesity treatment plan along with follow-up of her obesity related diagnoses. Erin [name] is on keeping a food journal and adhering to recommended goals of 1500-1600 calories and 90+ grams of protein and states she is following her eating plan approximately 0% of the time. Erin [name] states she is walking for 15-20 minutes 7 times per week.  Today's visit was #: 15 Starting weight: 330 lbs Starting date: 11//03/2018 Today's weight: 318 lbs Today's date: 03/27/2023 Total lbs lost to date: 12 Total lbs lost since last in-office visit: 0  Interim History: Patient has struggled more with her weight loss.  She has not been following her plan or journaling as much, but she is trying to portion control.  She is eating more and feels her hunger has increased.  Subjective:   1. Vitamin D deficiency Patient's vitamin D level is improving and almost at goal.  2. Type 2 diabetes mellitus with other specified complication, without long-term current use of insulin (HCC) Patient's recent A1c is uncontrolled.  She feels an increase in polyphagia recently especially with decreased protein and increased eating out.  3. Hypertension associated with diabetes (HCC) Patient's blood pressure is controlled on her medications, and she denies chest pain or dizziness.  Assessment/Plan:   1. Vitamin D deficiency We will refill prescription vitamin D for 1 month, and we will recheck labs in 1 month.  - Vitamin D, Ergocalciferol, (DRISDOL) 1.25 MG (50000 UNIT) CAPS capsule; Take 1 capsule (50,000 Units total) by mouth every 7 (seven) days.  Dispense: 5 capsule; Refill: 0  2. Type 2 diabetes mellitus with other specified complication, without long-term current use of insulin (HCC) We will refill Mounjaro for 1 month.  Patient is to get back to a structured eating plan, and we will recheck labs in 1 month.  - tirzepatide (MOUNJARO) 12.5  MG/0.5ML Pen; Inject 12.5 mg into the skin once a week.  Dispense: 6 mL; Refill: 0  3. Hypertension associated with diabetes (HCC) We will refill losartan for 1 month patient will continue with her diet, and we will recheck labs in 1 month.  - losartan (COZAAR) 100 MG tablet; Take 1 tablet (100 mg total) by mouth daily.  Dispense: 30 tablet; Refill: 0  4. BMI 45.0-49.9, adult (HCC)  5. Obesity- Beginning BMI 50.18 Erin [name] is currently in the action stage of change. As such, her goal is to continue with weight loss efforts. She has agreed to the Category 3 Plan, or keeping a food journal and adhering to recommended goals of 1500 calories and 100+ grams of protein, or the Pescatarian Plan.   Exercise goals: As is.   Behavioral modification strategies: increasing lean protein intake, decreasing eating out, and meal planning and cooking strategies.  Erin [name] has agreed to follow-up with our clinic in 3 weeks. She was informed of the importance of frequent follow-up visits to maximize her success with intensive lifestyle modifications for her multiple health conditions.   Objective:   Blood pressure 119/65, pulse 66, temperature 97.9 F (36.6 C), height 5\' 8"  (1.727 m), weight (!) 318 lb (144.2 kg), SpO2 97%. Body mass index is 48.35 kg/m.  Lab Results  Component Value Date   CREATININE 0.6 11/14/2021   BUN 11 11/14/2021   NA 141 11/14/2021   K 4.3 11/14/2021   CL 106 11/14/2021   CO2 30 (A) 11/14/2021   Lab Results  Component Value Date  ALT 10 11/14/2021   AST 13 11/14/2021   ALKPHOS 92 11/14/2021   BILITOT 0.3 06/19/2021   Lab Results  Component Value Date   HGBA1C 7.6 (H) 10/16/2022   HGBA1C 7.2 (H) 06/18/2022   HGBA1C 7.2 11/14/2021   HGBA1C 6.8 (H) 06/19/2021   HGBA1C 6.6 (H) 01/11/2021   Lab Results  Component Value Date   INSULIN 19.0 06/18/2022   INSULIN 13.9 06/19/2021   INSULIN 12.5 01/11/2021   INSULIN 7.6 09/19/2020   INSULIN 17.3 05/30/2020   Lab  Results  Component Value Date   TSH 3.690 07/16/2018   Lab Results  Component Value Date   CHOL 139 06/18/2022   HDL 48 06/18/2022   LDLCALC 78 06/18/2022   TRIG 66 06/18/2022   CHOLHDL 2.9 06/18/2022   Lab Results  Component Value Date   VD25OH 48.5 10/16/2022   VD25OH 59.0 06/19/2021   VD25OH 47.7 01/11/2021   Lab Results  Component Value Date   WBC 7.4 12/09/2019   HGB 11.0 (A) 12/09/2019   HCT 35 (A) 12/09/2019   MCV 79 07/16/2018   PLT 339 12/09/2019   Lab Results  Component Value Date   IRON 38 (L) 12/20/2016   TIBC 244 12/20/2016   FERRITIN 590 (H) 12/20/2016   Attestation Statements:   Reviewed by clinician on day of visit: allergies, medications, problem list, medical history, surgical history, family history, social history, and previous encounter notes.   I, Shayonna Ocampo, am acting as transcriptionist for Quillian Quince, MD.  I have reviewed the above documentation for accuracy and completeness, and I agree with the above. -  Quillian Quince, MD

## 2023-04-17 ENCOUNTER — Ambulatory Visit (INDEPENDENT_AMBULATORY_CARE_PROVIDER_SITE_OTHER): Payer: 59 | Admitting: Family Medicine

## 2023-04-22 ENCOUNTER — Encounter (INDEPENDENT_AMBULATORY_CARE_PROVIDER_SITE_OTHER): Payer: Self-pay | Admitting: Family Medicine

## 2023-04-22 ENCOUNTER — Other Ambulatory Visit (HOSPITAL_COMMUNITY): Payer: Self-pay

## 2023-04-22 ENCOUNTER — Ambulatory Visit (INDEPENDENT_AMBULATORY_CARE_PROVIDER_SITE_OTHER): Payer: 59 | Admitting: Family Medicine

## 2023-04-22 ENCOUNTER — Other Ambulatory Visit (INDEPENDENT_AMBULATORY_CARE_PROVIDER_SITE_OTHER): Payer: Self-pay | Admitting: Family Medicine

## 2023-04-22 VITALS — BP 125/74 | HR 67 | Temp 97.8°F | Ht 68.0 in | Wt 320.0 lb

## 2023-04-22 DIAGNOSIS — Z6841 Body Mass Index (BMI) 40.0 and over, adult: Secondary | ICD-10-CM

## 2023-04-22 DIAGNOSIS — I152 Hypertension secondary to endocrine disorders: Secondary | ICD-10-CM | POA: Diagnosis not present

## 2023-04-22 DIAGNOSIS — E669 Obesity, unspecified: Secondary | ICD-10-CM

## 2023-04-22 DIAGNOSIS — E1169 Type 2 diabetes mellitus with other specified complication: Secondary | ICD-10-CM

## 2023-04-22 DIAGNOSIS — E559 Vitamin D deficiency, unspecified: Secondary | ICD-10-CM

## 2023-04-22 DIAGNOSIS — Z7985 Long-term (current) use of injectable non-insulin antidiabetic drugs: Secondary | ICD-10-CM

## 2023-04-22 DIAGNOSIS — E1159 Type 2 diabetes mellitus with other circulatory complications: Secondary | ICD-10-CM | POA: Diagnosis not present

## 2023-04-22 MED ORDER — TIRZEPATIDE 12.5 MG/0.5ML ~~LOC~~ SOAJ
12.5000 mg | SUBCUTANEOUS | 0 refills | Status: DC
Start: 2023-04-22 — End: 2023-04-22
  Filled 2023-04-22: qty 2, 28d supply, fill #0

## 2023-04-22 MED ORDER — LOSARTAN POTASSIUM 100 MG PO TABS
100.0000 mg | ORAL_TABLET | Freq: Every day | ORAL | 0 refills | Status: DC
Start: 2023-04-22 — End: 2023-05-22

## 2023-04-22 MED ORDER — VITAMIN D (ERGOCALCIFEROL) 1.25 MG (50000 UNIT) PO CAPS
50000.0000 [IU] | ORAL_CAPSULE | ORAL | 0 refills | Status: DC
Start: 2023-04-22 — End: 2023-05-22

## 2023-04-22 MED ORDER — TIRZEPATIDE 15 MG/0.5ML ~~LOC~~ SOAJ
15.0000 mg | SUBCUTANEOUS | 0 refills | Status: DC
Start: 2023-04-22 — End: 2023-05-22
  Filled 2023-04-22: qty 2, 28d supply, fill #0

## 2023-04-23 NOTE — Progress Notes (Signed)
Chief Complaint:   OBESITY Erin [name] is here to discuss her progress with her obesity treatment plan along with follow-up of her obesity related diagnoses. Erin [name] is on the Category 3 Plan, keeping a food journal and adhering to recommended goals of 1500 calories and 100+ grams of protein, and the Pescatarian Plan and states she is following her eating plan approximately 50% of the time. Erin [name] states she is walking for 15 minutes 3 times per week.  Today's visit was #: 16 Starting weight: 330 lbs Starting date: 07/16/2018 Today's weight: 320 lbs Today's date: 04/22/2023 Total lbs lost to date: 10 Total lbs lost since last in-office visit: 0  Interim History: Patient has been struggling with her weight loss for the last few months.  She especially struggles with meal planning.  Subjective:   1. Hypertension associated with diabetes (HCC) Patient's blood pressure is at goal today.  No side effects were noted, and she is due for labs.  2. Vitamin D deficiency Patient is on vitamin D, and she denies nausea, vomiting, or muscle weakness.  She is due for labs.  3. Type 2 diabetes mellitus with other specified complication, without long-term current use of insulin (HCC) Patient's last A1c worsened and has been uncontrolled now.  She continues to work on her weight loss but she has struggled more with that as well.  Assessment/Plan:   1. Hypertension associated with diabetes (HCC) We will check labs today, and we will refill losartan 100 mg once daily for 1 month.  - losartan (COZAAR) 100 MG tablet; Take 1 tablet (100 mg total) by mouth daily.  Dispense: 30 tablet; Refill: 0 - CMP14+EGFR  2. Vitamin D deficiency We will check labs today, and we will refill prescription vitamin D once weekly for 1 month.  - Vitamin D, Ergocalciferol, (DRISDOL) 1.25 MG (50000 UNIT) CAPS capsule; Take 1 capsule (50,000 Units total) by mouth every 7 (seven) days.  Dispense: 5 capsule; Refill: 0 -  VITAMIN D 25 Hydroxy (Vit-D Deficiency, Fractures)  3. Type 2 diabetes mellitus with other specified complication, without long-term current use of insulin (HCC) We will check labs today.  Patient agreed to increase Mounjaro to 15 mg once weekly, and we will refill for 1 month.  - Lipid Panel With LDL/HDL Ratio - Insulin, random - Hemoglobin A1c - Vitamin B12 - Microalbumin / creatinine urine ratio - tirzepatide (MOUNJARO) 15 MG/0.5ML Pen; Inject 15 mg into the skin once a week.  Dispense: 2 mL; Refill: 0  4. BMI 45.0-49.9, adult (HCC)  5. Obesity with Beginning BMI of 50.18 Erin [name] is currently in the action stage of change. As such, her goal is to continue with weight loss efforts. She has agreed to keeping a food journal and adhering to recommended goals of 1500 calories and 100+ grams of protein daily.   Exercise goals: All adults should avoid inactivity. Some physical activity is better than none, and adults who participate in any amount of physical activity gain some health benefits.  Behavioral modification strategies: meal planning and cooking strategies.  Erin [name] has agreed to follow-up with our clinic in 4 weeks. She was informed of the importance of frequent follow-up visits to maximize her success with intensive lifestyle modifications for her multiple health conditions.   Erin [name] was informed we would discuss her lab results at her next visit unless there is a critical issue that needs to be addressed sooner. Erin [name] agreed to keep her next visit at the agreed upon time to  discuss these results.  Objective:   Blood pressure 125/74, pulse 67, temperature 97.8 F (36.6 C), height 5\' 8"  (1.727 m), weight (!) 320 lb (145.2 kg), SpO2 99%. Body mass index is 48.66 kg/m.  Lab Results  Component Value Date   CREATININE 0.63 04/22/2023   BUN 9 04/22/2023   NA 141 04/22/2023   K 4.2 04/22/2023   CL 103 04/22/2023   CO2 26 04/22/2023   Lab Results  Component Value Date   ALT  9 04/22/2023   AST 13 04/22/2023   ALKPHOS 95 04/22/2023   BILITOT 0.2 04/22/2023   Lab Results  Component Value Date   HGBA1C 7.6 (H) 04/22/2023   HGBA1C 7.6 (H) 10/16/2022   HGBA1C 7.2 (H) 06/18/2022   HGBA1C 7.2 11/14/2021   HGBA1C 6.8 (H) 06/19/2021   Lab Results  Component Value Date   INSULIN WILL FOLLOW 04/22/2023   INSULIN 19.0 06/18/2022   INSULIN 13.9 06/19/2021   INSULIN 12.5 01/11/2021   INSULIN 7.6 09/19/2020   Lab Results  Component Value Date   TSH 3.690 07/16/2018   Lab Results  Component Value Date   CHOL 138 04/22/2023   HDL 48 04/22/2023   LDLCALC 74 04/22/2023   TRIG 83 04/22/2023   CHOLHDL 2.9 06/18/2022   Lab Results  Component Value Date   VD25OH 53.2 04/22/2023   VD25OH 48.5 10/16/2022   VD25OH 59.0 06/19/2021   Lab Results  Component Value Date   WBC 7.4 12/09/2019   HGB 11.0 (A) 12/09/2019   HCT 35 (A) 12/09/2019   MCV 79 07/16/2018   PLT 339 12/09/2019   Lab Results  Component Value Date   IRON 38 (L) 12/20/2016   TIBC 244 12/20/2016   FERRITIN 590 (H) 12/20/2016   Attestation Statements:   Reviewed by clinician on day of visit: allergies, medications, problem list, medical history, surgical history, family history, social history, and previous encounter notes.  I have personally spent 40 minutes total time today in preparation, patient care, and documentation for this visit, including the following: review of clinical lab tests; review of medical tests/procedures/services.  I, Nichollette Sherer, am acting as transcriptionist for Quillian Quince, MD.  I have reviewed the above documentation for accuracy and completeness, and I agree with the above. -  Quillian Quince, MD

## 2023-05-17 ENCOUNTER — Other Ambulatory Visit (INDEPENDENT_AMBULATORY_CARE_PROVIDER_SITE_OTHER): Payer: Self-pay | Admitting: Family Medicine

## 2023-05-17 DIAGNOSIS — E1159 Type 2 diabetes mellitus with other circulatory complications: Secondary | ICD-10-CM

## 2023-05-21 ENCOUNTER — Other Ambulatory Visit (INDEPENDENT_AMBULATORY_CARE_PROVIDER_SITE_OTHER): Payer: Self-pay | Admitting: Family Medicine

## 2023-05-21 DIAGNOSIS — I152 Hypertension secondary to endocrine disorders: Secondary | ICD-10-CM

## 2023-05-22 ENCOUNTER — Other Ambulatory Visit (HOSPITAL_COMMUNITY): Payer: Self-pay

## 2023-05-22 ENCOUNTER — Ambulatory Visit (INDEPENDENT_AMBULATORY_CARE_PROVIDER_SITE_OTHER): Payer: 59 | Admitting: Family Medicine

## 2023-05-22 ENCOUNTER — Encounter (INDEPENDENT_AMBULATORY_CARE_PROVIDER_SITE_OTHER): Payer: Self-pay | Admitting: Family Medicine

## 2023-05-22 VITALS — BP 119/69 | HR 60 | Temp 98.2°F | Ht 68.0 in | Wt 321.0 lb

## 2023-05-22 DIAGNOSIS — E119 Type 2 diabetes mellitus without complications: Secondary | ICD-10-CM

## 2023-05-22 DIAGNOSIS — E559 Vitamin D deficiency, unspecified: Secondary | ICD-10-CM | POA: Diagnosis not present

## 2023-05-22 DIAGNOSIS — E538 Deficiency of other specified B group vitamins: Secondary | ICD-10-CM | POA: Diagnosis not present

## 2023-05-22 DIAGNOSIS — E1159 Type 2 diabetes mellitus with other circulatory complications: Secondary | ICD-10-CM

## 2023-05-22 DIAGNOSIS — I110 Hypertensive heart disease with heart failure: Secondary | ICD-10-CM | POA: Diagnosis not present

## 2023-05-22 DIAGNOSIS — Z7984 Long term (current) use of oral hypoglycemic drugs: Secondary | ICD-10-CM

## 2023-05-22 DIAGNOSIS — Z6841 Body Mass Index (BMI) 40.0 and over, adult: Secondary | ICD-10-CM

## 2023-05-22 DIAGNOSIS — E1169 Type 2 diabetes mellitus with other specified complication: Secondary | ICD-10-CM

## 2023-05-22 DIAGNOSIS — E669 Obesity, unspecified: Secondary | ICD-10-CM

## 2023-05-22 MED ORDER — EMPAGLIFLOZIN 10 MG PO TABS
10.0000 mg | ORAL_TABLET | Freq: Every day | ORAL | 0 refills | Status: DC
Start: 2023-05-22 — End: 2023-06-19

## 2023-05-22 MED ORDER — LOSARTAN POTASSIUM 100 MG PO TABS
100.0000 mg | ORAL_TABLET | Freq: Every day | ORAL | 0 refills | Status: DC
Start: 2023-05-22 — End: 2023-06-19

## 2023-05-22 MED ORDER — VITAMIN B-12 1000 MCG PO TABS
1000.0000 ug | ORAL_TABLET | Freq: Every day | ORAL | 0 refills | Status: DC
Start: 2023-05-22 — End: 2023-09-23

## 2023-05-22 MED ORDER — TIRZEPATIDE 15 MG/0.5ML ~~LOC~~ SOAJ
15.0000 mg | SUBCUTANEOUS | 0 refills | Status: DC
Start: 2023-05-22 — End: 2023-06-19
  Filled 2023-05-22: qty 2, 28d supply, fill #0

## 2023-05-22 MED ORDER — VITAMIN D (ERGOCALCIFEROL) 1.25 MG (50000 UNIT) PO CAPS
50000.0000 [IU] | ORAL_CAPSULE | ORAL | 0 refills | Status: DC
Start: 2023-05-22 — End: 2023-06-19

## 2023-05-22 NOTE — Progress Notes (Signed)
.smr  Office: [phone]  /  Fax: [phone]  WEIGHT SUMMARY AND BIOMETRICS  Anthropometric Measurements Height: 5\' 8"  (1.727 m) Weight: (!) 321 lb (145.6 kg) BMI (Calculated): 48.82 Weight at Last Visit: 320 lb Weight Lost Since Last Visit: 0 Weight Gained Since Last Visit: 1 lb Starting Weight: 330 lb Total Weight Loss (lbs): 9 lb (4.082 kg)   Body Composition  Body Fat %: 56.9 % Fat Mass (lbs): 182.6 lbs Muscle Mass (lbs): 131.4 lbs Visceral Fat Rating : 21   Other Clinical Data Fasting: Yes Labs: No Today's Visit #: 17 Starting Date: 07/26/18    Chief Complaint: OBESITY   Discussed the use of AI scribe software for clinical note transcription with the patient, who gave verbal consent to proceed.  History of Present Illness   The patient presents today for a follow-up visit to discuss her obesity, vitamin D deficiency, hypertension, and diabetes. She reports a weight gain of one pound over the past month despite attempts to adhere to a category three diet plan, which she estimates she follows about fifty percent of the time. She is not currently engaging in any exercise. Her blood pressure is controlled at 119/69 on losartan 100 mg, for which she requests a refill.  The patient reports a decrease in her overall hunger and food intake, particularly in snacking. She recently returned from a five-day beach vacation, during which she did a significant amount of walking. However, she has noticed an increase in water retention, which she attributes to a higher sodium intake during her vacation.  The patient has been trying to incorporate more physical activity into her routine but reports foot pain that hinders her ability to walk. She has a stationary bike at home, which she uses occasionally, but she expresses a preference for walking. She reports that her arch has fallen in, and she is considering surgery to correct it.  The patient is currently on a regimen of  metformin twice a day and Mounjaro weekly for her diabetes. She reports no significant change in her appetite since increasing her dose of Mounjaro to 15. She has previously been on glipizide and Rebellus, the latter of which she reports initially helped with her appetite but eventually stopped being effective.  The patient's recent labs show a low B12 level of 250, which has been causing her to feel tired and achy. She also reports eating more vegetables than meat, which may be contributing to her low B12 levels. Her hemoglobin A1c level has remained stable at 7.6, which is higher than the desired level of below 7. She has previously been prescribed Januvia in 2017.          PHYSICAL EXAM:  Blood pressure 119/69, pulse 60, temperature 98.2 F (36.8 C), height 5\' 8"  (1.727 m), weight (!) 321 lb (145.6 kg), SpO2 100%. Body mass index is 48.81 kg/m.  DIAGNOSTIC DATA REVIEWED:  BMET    Component Value Date/Time   NA 141 04/22/2023 0844   NA 141 12/20/2016 1003   K 4.2 04/22/2023 0844   K 4.1 12/20/2016 1003   CL 103 04/22/2023 0844   CO2 26 04/22/2023 0844   CO2 28 12/20/2016 1003   GLUCOSE 118 (H) 04/22/2023 0844   GLUCOSE 139 12/20/2016 1003   BUN 9 04/22/2023 0844   BUN 10.1 12/20/2016 1003   CREATININE 0.63 04/22/2023 0844   CREATININE 0.7 12/20/2016 1003   CALCIUM 9.2 04/22/2023 0844   CALCIUM 9.5 12/20/2016 1003   GFRNONAA 105 09/19/2020  1610   GFRAA 121 09/19/2020 0923   Lab Results  Component Value Date   HGBA1C 7.6 (H) 04/22/2023   HGBA1C 8.6 (H) 07/16/2018   Lab Results  Component Value Date   INSULIN 21.7 04/22/2023   INSULIN 24.2 07/16/2018   Lab Results  Component Value Date   TSH 3.690 07/16/2018   CBC    Component Value Date/Time   WBC 7.4 12/09/2019 0000   WBC 8.4 01/15/2018 0954   WBC 7.2 12/20/2016 1003   RBC 4.35 12/09/2019 0000   HGB 11.0 (A) 12/09/2019 0000   HGB 11.1 07/16/2018 1235   HGB 10.9 (L) 12/20/2016 1003   HCT 35 (A) 12/09/2019  0000   HCT 35.5 07/16/2018 1235   HCT 34.8 12/20/2016 1003   PLT 339 12/09/2019 0000   PLT 312 01/15/2018 0954   PLT 317 12/20/2016 1003   MCV 79 07/16/2018 1235   MCV 80.6 12/20/2016 1003   MCH 24.6 (L) 07/16/2018 1235   MCH 25.6 01/15/2018 0954   MCHC 31.3 (L) 07/16/2018 1235   MCHC 31.2 (L) 01/15/2018 0954   RDW 14.1 07/16/2018 1235   RDW 14.9 (H) 12/20/2016 1003   Iron Studies    Component Value Date/Time   IRON 38 (L) 12/20/2016 1003   TIBC 244 12/20/2016 1003   FERRITIN 590 (H) 12/20/2016 1003   IRONPCTSAT 16 (L) 12/20/2016 1003   Lipid Panel     Component Value Date/Time   CHOL 138 04/22/2023 0844   TRIG 83 04/22/2023 0844   HDL 48 04/22/2023 0844   CHOLHDL 2.9 06/18/2022 1314   LDLCALC 74 04/22/2023 0844   Hepatic Function Panel     Component Value Date/Time   PROT 6.5 04/22/2023 0844   PROT 6.5 11/14/2021 0000   PROT 7.0 12/20/2016 1003   ALBUMIN 4.0 04/22/2023 0844   ALBUMIN 3.7 12/20/2016 1003   AST 13 04/22/2023 0844   AST 15 12/20/2016 1003   ALT 9 04/22/2023 0844   ALT 13 12/20/2016 1003   ALKPHOS 95 04/22/2023 0844   ALKPHOS 87 12/20/2016 1003   BILITOT 0.2 04/22/2023 0844   BILITOT 0.49 12/20/2016 1003      Component Value Date/Time   TSH 3.690 07/16/2018 1235   Nutritional Lab Results  Component Value Date   VD25OH 53.2 04/22/2023   VD25OH 48.5 10/16/2022   VD25OH 59.0 06/19/2021     Assessment and Plan    Obesity Weight gain of 1 pound over the past month. Difficulty adhering to category three plan and not currently exercising. Discussed the importance of protein intake for metabolism and satiety. Limited physical activity due to foot pain. -Encouraged to commit to 5 minutes of stationary biking daily. -Continue category three plan with emphasis on protein intake at every meal.  Hypertension Blood pressure controlled at 119/69 on Losartan 100mg  daily. -Refill Losartan 100mg  daily.  Vitamin D deficiency Current level is 53,  within normal range. -Refill Vitamin D supplement.  Vitamin B12 deficiency Current level is 250, below the normal range of 500. Discussed potential symptoms of deficiency including fatigue and aches. -Start Vitamin B12 supplement daily. -Check B12 level in 3 months.  Type 2 Diabetes Mellitus Hemoglobin A1c is 7.6, above the target of less than 7. Discussed the importance of controlling blood sugar to prevent complications such as heart disease and kidney damage. Currently on Metformin twice daily. -Add Jardiance 10mg  daily for additional blood sugar control and potential weight loss benefits. -Check blood sugars and kidney function  in 2 months.  General Health Maintenance / Followup Plans -Schedule follow-up appointment in 4 weeks.       She was informed of the importance of frequent follow up visits to maximize her success with intensive lifestyle modifications for her multiple health conditions.    Quillian Quince, MD

## 2023-06-19 ENCOUNTER — Other Ambulatory Visit (INDEPENDENT_AMBULATORY_CARE_PROVIDER_SITE_OTHER): Payer: Self-pay | Admitting: Family Medicine

## 2023-06-19 ENCOUNTER — Other Ambulatory Visit (HOSPITAL_COMMUNITY): Payer: Self-pay

## 2023-06-19 ENCOUNTER — Ambulatory Visit (INDEPENDENT_AMBULATORY_CARE_PROVIDER_SITE_OTHER): Payer: 59 | Admitting: Family Medicine

## 2023-06-19 ENCOUNTER — Encounter (INDEPENDENT_AMBULATORY_CARE_PROVIDER_SITE_OTHER): Payer: Self-pay | Admitting: Family Medicine

## 2023-06-19 VITALS — BP 107/67 | HR 67 | Temp 98.1°F | Ht 68.0 in | Wt 309.0 lb

## 2023-06-19 DIAGNOSIS — E559 Vitamin D deficiency, unspecified: Secondary | ICD-10-CM | POA: Diagnosis not present

## 2023-06-19 DIAGNOSIS — E1169 Type 2 diabetes mellitus with other specified complication: Secondary | ICD-10-CM

## 2023-06-19 DIAGNOSIS — E538 Deficiency of other specified B group vitamins: Secondary | ICD-10-CM

## 2023-06-19 DIAGNOSIS — Z7985 Long-term (current) use of injectable non-insulin antidiabetic drugs: Secondary | ICD-10-CM

## 2023-06-19 DIAGNOSIS — Z6841 Body Mass Index (BMI) 40.0 and over, adult: Secondary | ICD-10-CM

## 2023-06-19 DIAGNOSIS — E669 Obesity, unspecified: Secondary | ICD-10-CM | POA: Diagnosis not present

## 2023-06-19 DIAGNOSIS — E1159 Type 2 diabetes mellitus with other circulatory complications: Secondary | ICD-10-CM

## 2023-06-19 DIAGNOSIS — E119 Type 2 diabetes mellitus without complications: Secondary | ICD-10-CM | POA: Diagnosis not present

## 2023-06-19 DIAGNOSIS — E66813 Obesity, class 3: Secondary | ICD-10-CM

## 2023-06-19 DIAGNOSIS — Z7984 Long term (current) use of oral hypoglycemic drugs: Secondary | ICD-10-CM

## 2023-06-19 DIAGNOSIS — I10 Essential (primary) hypertension: Secondary | ICD-10-CM | POA: Diagnosis not present

## 2023-06-19 DIAGNOSIS — I152 Hypertension secondary to endocrine disorders: Secondary | ICD-10-CM

## 2023-06-19 MED ORDER — TIRZEPATIDE 15 MG/0.5ML ~~LOC~~ SOAJ
15.0000 mg | SUBCUTANEOUS | 0 refills | Status: DC
  Filled 2023-06-19: qty 2, 28d supply, fill #0

## 2023-06-19 MED ORDER — LOSARTAN POTASSIUM 100 MG PO TABS
100.0000 mg | ORAL_TABLET | Freq: Every day | ORAL | 0 refills | Status: DC
Start: 2023-06-19 — End: 2023-07-24

## 2023-06-19 MED ORDER — VITAMIN D (ERGOCALCIFEROL) 1.25 MG (50000 UNIT) PO CAPS: 50000.0000 [IU] | ORAL_CAPSULE | ORAL | 0 refills | Status: DC

## 2023-06-19 MED ORDER — EMPAGLIFLOZIN 10 MG PO TABS
10.0000 mg | ORAL_TABLET | Freq: Every day | ORAL | 0 refills | Status: DC
Start: 2023-06-19 — End: 2023-07-24

## 2023-06-19 NOTE — Progress Notes (Signed)
.smr  Office: [phone]  /  Fax: [phone]  WEIGHT SUMMARY AND BIOMETRICS  Anthropometric Measurements Height: 5\' 8"  (1.727 m) Weight: (!) 309 lb (140.2 kg) BMI (Calculated): 46.99 Weight at Last Visit: 321 lb Weight Lost Since Last Visit: 12 lb Weight Gained Since Last Visit: 0 Starting Weight: 330 lb Total Weight Loss (lbs): 21 lb (9.526 kg)   Body Composition  Body Fat %: 54.7 % Fat Mass (lbs): 169.2 lbs Muscle Mass (lbs): 133 lbs Total Body Water (lbs): 106.6 lbs Visceral Fat Rating : 20   Other Clinical Data Fasting: No Labs: No Today's Visit #: 18 Starting Date: 07/26/18    Chief Complaint: OBESITY   History of Present Illness   The patient, with a history of vitamin D deficiency, hypertension, and diabetes, has been adhering to her prescribed medication regimen, which includes vitamin D 50,000 IU weekly, losartan 100 mg daily, Jardiance 10 mg daily, and Mounjaro 15 mg weekly. She has also been taking metformin for her diabetes. She has made significant progress in her weight loss efforts, losing 12 pounds in the last month, despite not currently exercising. She reports adherence to her category three plan about 50% of the time.  The patient has noticed an increase in urination, which she attributes to the [place]. She initially experienced symptoms suggestive of a yeast infection, but these have since resolved after completing a course of antibiotics for bronchitis. She reports that her hunger levels have been manageable, and she has been making an effort to consume a good breakfast each morning. However, she sometimes skips lunch due to lack of hunger or time constraints.  The patient has also experienced episodes of vertigo, with the room spinning, on two occasions over the past year. The most recent episode occurred three months ago. She has not been checking her blood sugars at home due to an issue with her insurance not covering the cost of the strips. She  has not experienced any lightheadedness or dizziness, which could suggest dehydration or low blood pressure. Her blood pressure was well controlled at the time of the consultation.          PHYSICAL EXAM:  Blood pressure 107/67, pulse 67, temperature 98.1 F (36.7 C), height 5\' 8"  (1.727 m), weight (!) 309 lb (140.2 kg), SpO2 100%. Body mass index is 46.98 kg/m.  DIAGNOSTIC DATA REVIEWED:  BMET    Component Value Date/Time   NA 141 04/22/2023 0844   NA 141 12/20/2016 1003   K 4.2 04/22/2023 0844   K 4.1 12/20/2016 1003   CL 103 04/22/2023 0844   CO2 26 04/22/2023 0844   CO2 28 12/20/2016 1003   GLUCOSE 118 (H) 04/22/2023 0844   GLUCOSE 139 12/20/2016 1003   BUN 9 04/22/2023 0844   BUN 10.1 12/20/2016 1003   CREATININE 0.63 04/22/2023 0844   CREATININE 0.7 12/20/2016 1003   CALCIUM 9.2 04/22/2023 0844   CALCIUM 9.5 12/20/2016 1003   GFRNONAA 105 09/19/2020 0923   GFRAA 121 09/19/2020 0923   Lab Results  Component Value Date   HGBA1C 7.6 (H) 04/22/2023   HGBA1C 8.6 (H) 07/16/2018   Lab Results  Component Value Date   INSULIN 21.7 04/22/2023   INSULIN 24.2 07/16/2018   Lab Results  Component Value Date   TSH 3.690 07/16/2018   CBC    Component Value Date/Time   WBC 7.4 12/09/2019 0000   WBC 8.4 01/15/2018 0954   WBC 7.2 12/20/2016 1003   RBC 4.35 12/09/2019 0000  HGB 11.0 (A) 12/09/2019 0000   HGB 11.1 07/16/2018 1235   HGB 10.9 (L) 12/20/2016 1003   HCT 35 (A) 12/09/2019 0000   HCT 35.5 07/16/2018 1235   HCT 34.8 12/20/2016 1003   PLT 339 12/09/2019 0000   PLT 312 01/15/2018 0954   PLT 317 12/20/2016 1003   MCV 79 07/16/2018 1235   MCV 80.6 12/20/2016 1003   MCH 24.6 (L) 07/16/2018 1235   MCH 25.6 01/15/2018 0954   MCHC 31.3 (L) 07/16/2018 1235   MCHC 31.2 (L) 01/15/2018 0954   RDW 14.1 07/16/2018 1235   RDW 14.9 (H) 12/20/2016 1003   Iron Studies    Component Value Date/Time   IRON 38 (L) 12/20/2016 1003   TIBC 244 12/20/2016 1003    FERRITIN 590 (H) 12/20/2016 1003   IRONPCTSAT 16 (L) 12/20/2016 1003   Lipid Panel     Component Value Date/Time   CHOL 138 04/22/2023 0844   TRIG 83 04/22/2023 0844   HDL 48 04/22/2023 0844   CHOLHDL 2.9 06/18/2022 1314   LDLCALC 74 04/22/2023 0844   Hepatic Function Panel     Component Value Date/Time   PROT 6.5 04/22/2023 0844   PROT 6.5 11/14/2021 0000   PROT 7.0 12/20/2016 1003   ALBUMIN 4.0 04/22/2023 0844   ALBUMIN 3.7 12/20/2016 1003   AST 13 04/22/2023 0844   AST 15 12/20/2016 1003   ALT 9 04/22/2023 0844   ALT 13 12/20/2016 1003   ALKPHOS 95 04/22/2023 0844   ALKPHOS 87 12/20/2016 1003   BILITOT 0.2 04/22/2023 0844   BILITOT 0.49 12/20/2016 1003      Component Value Date/Time   TSH 3.690 07/16/2018 1235   Nutritional Lab Results  Component Value Date   VD25OH 53.2 04/22/2023   VD25OH 48.5 10/16/2022   VD25OH 59.0 06/19/2021     Assessment and Plan    Type 2 Diabetes Mellitus Improved control with Jardiance 10mg  daily, Mounjaro 15mg  weekly, and Metformin. Noted weight loss and increased urination. Discussed the mechanism of Jardiance and potential for yeast infections. No current symptoms of infection. -Continue current regimen. -Refill Jardiance, Mounjaro, and Metformin prescriptions. -Consider home glucose monitoring. Attempt to refill Accu-Chek strips. If not covered, patient may consider purchasing over-the-counter glucometer and strips.  Hypertension Well controlled on Losartan 100mg  daily. No symptoms of hypotension. -Continue Losartan 100mg  daily. -Refill Losartan prescription.  Vitamin D Deficiency Stable on Vitamin D 50,000 IU weekly. -Continue Vitamin D 50,000 IU weekly. -Refill Vitamin D prescription.   Obesity  Noted weight loss of 12 pounds in the last month. Following category three plan 50% of the time. Not currently exercising. Discussed importance of consistent meal intake, focusing on protein. -Continue current dietary  efforts. -Consider adding regular exercise to regimen.  Follow-up -Schedule next appointment in approximately one month. -Consider using MyChart for communication and access to test results.      She was informed of the importance of frequent follow up visits to maximize her success with intensive lifestyle modifications for her multiple health conditions.    Quillian Quince, MD

## 2023-06-27 ENCOUNTER — Other Ambulatory Visit (HOSPITAL_COMMUNITY): Payer: Self-pay

## 2023-07-17 ENCOUNTER — Other Ambulatory Visit (INDEPENDENT_AMBULATORY_CARE_PROVIDER_SITE_OTHER): Payer: Self-pay | Admitting: Family Medicine

## 2023-07-17 DIAGNOSIS — E1159 Type 2 diabetes mellitus with other circulatory complications: Secondary | ICD-10-CM

## 2023-07-19 ENCOUNTER — Other Ambulatory Visit (INDEPENDENT_AMBULATORY_CARE_PROVIDER_SITE_OTHER): Payer: Self-pay | Admitting: Family Medicine

## 2023-07-19 DIAGNOSIS — I152 Hypertension secondary to endocrine disorders: Secondary | ICD-10-CM

## 2023-07-22 ENCOUNTER — Other Ambulatory Visit (INDEPENDENT_AMBULATORY_CARE_PROVIDER_SITE_OTHER): Payer: Self-pay | Admitting: Family Medicine

## 2023-07-22 DIAGNOSIS — E1169 Type 2 diabetes mellitus with other specified complication: Secondary | ICD-10-CM

## 2023-07-24 ENCOUNTER — Other Ambulatory Visit (HOSPITAL_COMMUNITY): Payer: Self-pay

## 2023-07-24 ENCOUNTER — Ambulatory Visit (INDEPENDENT_AMBULATORY_CARE_PROVIDER_SITE_OTHER): Payer: 59 | Admitting: Family Medicine

## 2023-07-24 ENCOUNTER — Encounter (INDEPENDENT_AMBULATORY_CARE_PROVIDER_SITE_OTHER): Payer: Self-pay | Admitting: Family Medicine

## 2023-07-24 VITALS — BP 123/65 | HR 60 | Temp 98.0°F | Ht 68.0 in | Wt 307.0 lb

## 2023-07-24 DIAGNOSIS — I10 Essential (primary) hypertension: Secondary | ICD-10-CM

## 2023-07-24 DIAGNOSIS — E669 Obesity, unspecified: Secondary | ICD-10-CM

## 2023-07-24 DIAGNOSIS — E559 Vitamin D deficiency, unspecified: Secondary | ICD-10-CM

## 2023-07-24 DIAGNOSIS — Z7985 Long-term (current) use of injectable non-insulin antidiabetic drugs: Secondary | ICD-10-CM

## 2023-07-24 DIAGNOSIS — E119 Type 2 diabetes mellitus without complications: Secondary | ICD-10-CM | POA: Diagnosis not present

## 2023-07-24 DIAGNOSIS — Z7984 Long term (current) use of oral hypoglycemic drugs: Secondary | ICD-10-CM

## 2023-07-24 DIAGNOSIS — I152 Hypertension secondary to endocrine disorders: Secondary | ICD-10-CM

## 2023-07-24 DIAGNOSIS — Z6841 Body Mass Index (BMI) 40.0 and over, adult: Secondary | ICD-10-CM

## 2023-07-24 DIAGNOSIS — E1169 Type 2 diabetes mellitus with other specified complication: Secondary | ICD-10-CM

## 2023-07-24 MED ORDER — VITAMIN D (ERGOCALCIFEROL) 1.25 MG (50000 UNIT) PO CAPS: 50000.0000 [IU] | ORAL_CAPSULE | ORAL | 0 refills | Status: DC

## 2023-07-24 MED ORDER — EMPAGLIFLOZIN 10 MG PO TABS: 10.0000 mg | ORAL_TABLET | Freq: Every day | ORAL | 0 refills | Status: DC

## 2023-07-24 MED ORDER — TIRZEPATIDE 15 MG/0.5ML ~~LOC~~ SOAJ
15.0000 mg | SUBCUTANEOUS | 0 refills | Status: DC
  Filled 2023-07-24: qty 2, 28d supply, fill #0

## 2023-07-24 MED ORDER — LOSARTAN POTASSIUM 100 MG PO TABS: 100.0000 mg | ORAL_TABLET | Freq: Every day | ORAL | 0 refills | Status: DC

## 2023-07-24 NOTE — Progress Notes (Signed)
.smr  Office: [phone]  /  Fax: [phone]  WEIGHT SUMMARY AND BIOMETRICS  Anthropometric Measurements Height: 5\' 8"  (1.727 m) Weight: (!) 307 lb (139.3 kg) BMI (Calculated): 46.69 Weight at Last Visit: 309 lb Weight Lost Since Last Visit: 2 lb Weight Gained Since Last Visit: 0 Starting Weight: 330 lb Total Weight Loss (lbs): 23 lb (10.4 kg)   Body Composition  Body Fat %: 55.9 % Fat Mass (lbs): 171.8 lbs Muscle Mass (lbs): 128.6 lbs Visceral Fat Rating : 20   Other Clinical Data Fasting: Yes Labs: No Today's Visit #: 19 Starting Date: 07/26/18    Chief Complaint: OBESITY   History of Present Illness   The patient, with a history of type two diabetes, hypertension, vitamin D deficiency, and obesity, presents for a routine follow-up. She is currently on [place] for diabetes, Cozaar for hypertension, and a weekly prescription of 50,000 international units of vitamin D. She reports no new health issues and has lost two pounds since the last visit a month ago.  The patient is on a category three eating plan, which she adheres to approximately 50% of the time. She is not currently engaging in any exercise. She reports no excessive hunger and is generally satisfied with her current meal plan, finding it versatile and not monotonous.  The patient has noticed occasional symptoms suggestive of a urinary tract infection or yeast infection, which she attributes to not drinking enough water. She has not yet discussed these symptoms with her primary care provider.  The patient's blood pressure is well controlled, and she has requested refills of all current medications. She reports that her vitamin D prescription has run out. The patient's last labs were done in August, and she is due for another round of tests. She is unsure when her next appointment with her primary care provider is scheduled.          PHYSICAL EXAM:  Blood pressure 123/65, pulse 60,  temperature 98 F (36.7 C), height 5\' 8"  (1.727 m), weight (!) 307 lb (139.3 kg), SpO2 99%. Body mass index is 46.68 kg/m.  DIAGNOSTIC DATA REVIEWED:  BMET    Component Value Date/Time   NA 141 04/22/2023 0844   NA 141 12/20/2016 1003   K 4.2 04/22/2023 0844   K 4.1 12/20/2016 1003   CL 103 04/22/2023 0844   CO2 26 04/22/2023 0844   CO2 28 12/20/2016 1003   GLUCOSE 118 (H) 04/22/2023 0844   GLUCOSE 139 12/20/2016 1003   BUN 9 04/22/2023 0844   BUN 10.1 12/20/2016 1003   CREATININE 0.63 04/22/2023 0844   CREATININE 0.7 12/20/2016 1003   CALCIUM 9.2 04/22/2023 0844   CALCIUM 9.5 12/20/2016 1003   GFRNONAA 105 09/19/2020 0923   GFRAA 121 09/19/2020 0923   Lab Results  Component Value Date   HGBA1C 7.6 (H) 04/22/2023   HGBA1C 8.6 (H) 07/16/2018   Lab Results  Component Value Date   INSULIN 21.7 04/22/2023   INSULIN 24.2 07/16/2018   Lab Results  Component Value Date   TSH 3.690 07/16/2018   CBC    Component Value Date/Time   WBC 7.4 12/09/2019 0000   WBC 8.4 01/15/2018 0954   WBC 7.2 12/20/2016 1003   RBC 4.35 12/09/2019 0000   HGB 11.0 (A) 12/09/2019 0000   HGB 11.1 07/16/2018 1235   HGB 10.9 (L) 12/20/2016 1003   HCT 35 (A) 12/09/2019 0000   HCT 35.5 07/16/2018 1235   HCT 34.8 12/20/2016 1003  PLT 339 12/09/2019 0000   PLT 312 01/15/2018 0954   PLT 317 12/20/2016 1003   MCV 79 07/16/2018 1235   MCV 80.6 12/20/2016 1003   MCH 24.6 (L) 07/16/2018 1235   MCH 25.6 01/15/2018 0954   MCHC 31.3 (L) 07/16/2018 1235   MCHC 31.2 (L) 01/15/2018 0954   RDW 14.1 07/16/2018 1235   RDW 14.9 (H) 12/20/2016 1003   Iron Studies    Component Value Date/Time   IRON 38 (L) 12/20/2016 1003   TIBC 244 12/20/2016 1003   FERRITIN 590 (H) 12/20/2016 1003   IRONPCTSAT 16 (L) 12/20/2016 1003   Lipid Panel     Component Value Date/Time   CHOL 138 04/22/2023 0844   TRIG 83 04/22/2023 0844   HDL 48 04/22/2023 0844   CHOLHDL 2.9 06/18/2022 1314   LDLCALC 74 04/22/2023  0844   Hepatic Function Panel     Component Value Date/Time   PROT 6.5 04/22/2023 0844   PROT 6.5 11/14/2021 0000   PROT 7.0 12/20/2016 1003   ALBUMIN 4.0 04/22/2023 0844   ALBUMIN 3.7 12/20/2016 1003   AST 13 04/22/2023 0844   AST 15 12/20/2016 1003   ALT 9 04/22/2023 0844   ALT 13 12/20/2016 1003   ALKPHOS 95 04/22/2023 0844   ALKPHOS 87 12/20/2016 1003   BILITOT 0.2 04/22/2023 0844   BILITOT 0.49 12/20/2016 1003      Component Value Date/Time   TSH 3.690 07/16/2018 1235   Nutritional Lab Results  Component Value Date   VD25OH 53.2 04/22/2023   VD25OH 48.5 10/16/2022   VD25OH 59.0 06/19/2021     Assessment and Plan    Type 2 Diabetes Mellitus Type 2 diabetes mellitus, managed with Jardiance and Mounjaro. Reports occasional symptoms suggestive of urinary tract infections, likely due to glycosuria from Jardiance. Emphasized the importance of hydration to reduce infection risk and advised against cranberry juice due to its potential to raise blood sugar levels. - Refill Jardiance and Mounjaro - Advise hydration to prevent UTIs - Plan for fasting labs at next visit  Hypertension Hypertension, well-controlled on Cozaar. Blood pressure today is 123/65 mmHg. - Refill Cozaar  Obesity Obesity, has lost 2 pounds in the last month. Following category three eating plan approximately 50% of the time. Not currently exercising. Discussed the importance of adherence to the eating plan and incorporating light exercise, such as 10-15 minute walks on nice days. - Encourage adherence to eating plan - Advise starting light exercise, such as 10-15 minute walks on nice days  Vitamin D Deficiency Vitamin D deficiency, managed with weekly prescription of 50,000 IU vitamin D. Reports running out of medication. - Refill vitamin D prescription  General Health Maintenance Discussed strategies for holiday eating and the importance of hydration. Advised to limit celebration eating to the day  of the holiday and focus on protein intake. - Provide anticipatory guidance for holiday eating - Encourage discussing any urinary symptoms with primary care physician  Follow-up - Schedule next visit with fasting labs - Option to see physician assistant Shawn if needed.        She was informed of the importance of frequent follow up visits to maximize her success with intensive lifestyle modifications for her multiple health conditions.    Quillian Quince, MD

## 2023-08-20 ENCOUNTER — Other Ambulatory Visit (HOSPITAL_COMMUNITY): Payer: Self-pay

## 2023-08-20 MED ORDER — MOUNJARO 15 MG/0.5ML ~~LOC~~ SOAJ
15.0000 mg | SUBCUTANEOUS | 3 refills | Status: DC
  Filled 2023-08-20: qty 2, 28d supply, fill #0
  Filled 2023-09-18: qty 2, 28d supply, fill #1
  Filled 2023-10-17: qty 2, 28d supply, fill #2

## 2023-08-26 ENCOUNTER — Ambulatory Visit (INDEPENDENT_AMBULATORY_CARE_PROVIDER_SITE_OTHER): Payer: 59 | Admitting: Physician Assistant

## 2023-09-18 ENCOUNTER — Other Ambulatory Visit (HOSPITAL_COMMUNITY): Payer: Self-pay

## 2023-09-23 ENCOUNTER — Encounter (INDEPENDENT_AMBULATORY_CARE_PROVIDER_SITE_OTHER): Payer: Self-pay | Admitting: Physician Assistant

## 2023-09-23 ENCOUNTER — Ambulatory Visit (INDEPENDENT_AMBULATORY_CARE_PROVIDER_SITE_OTHER): Payer: 59 | Admitting: Physician Assistant

## 2023-09-23 VITALS — BP 128/75 | HR 68 | Temp 99.0°F | Ht 68.0 in | Wt 302.0 lb

## 2023-09-23 DIAGNOSIS — E559 Vitamin D deficiency, unspecified: Secondary | ICD-10-CM

## 2023-09-23 DIAGNOSIS — I152 Hypertension secondary to endocrine disorders: Secondary | ICD-10-CM

## 2023-09-23 DIAGNOSIS — E1159 Type 2 diabetes mellitus with other circulatory complications: Secondary | ICD-10-CM

## 2023-09-23 DIAGNOSIS — E538 Deficiency of other specified B group vitamins: Secondary | ICD-10-CM

## 2023-09-23 DIAGNOSIS — E785 Hyperlipidemia, unspecified: Secondary | ICD-10-CM

## 2023-09-23 DIAGNOSIS — Z6841 Body Mass Index (BMI) 40.0 and over, adult: Secondary | ICD-10-CM

## 2023-09-23 DIAGNOSIS — Z7984 Long term (current) use of oral hypoglycemic drugs: Secondary | ICD-10-CM

## 2023-09-23 DIAGNOSIS — E1169 Type 2 diabetes mellitus with other specified complication: Secondary | ICD-10-CM | POA: Diagnosis not present

## 2023-09-23 MED ORDER — VITAMIN D (ERGOCALCIFEROL) 1.25 MG (50000 UNIT) PO CAPS: 50000.0000 [IU] | ORAL_CAPSULE | ORAL | 0 refills | Status: DC

## 2023-09-23 MED ORDER — EMPAGLIFLOZIN 10 MG PO TABS: 10.0000 mg | ORAL_TABLET | Freq: Every day | ORAL | 0 refills | Status: DC

## 2023-09-23 MED ORDER — VITAMIN B-12 1000 MCG PO TABS: 1000.0000 ug | ORAL_TABLET | Freq: Every day | ORAL | 0 refills | Status: DC

## 2023-09-23 MED ORDER — ROSUVASTATIN CALCIUM 10 MG PO TABS: 10.0000 mg | ORAL_TABLET | Freq: Every day | ORAL | 1 refills | Status: DC

## 2023-09-23 MED ORDER — LOSARTAN POTASSIUM 100 MG PO TABS: 100.0000 mg | ORAL_TABLET | Freq: Every day | ORAL | 0 refills | Status: DC

## 2023-09-23 NOTE — Progress Notes (Signed)
 SUBJECTIVE: Discussed the use of AI scribe software for clinical note transcription with the patient, who gave verbal consent to proceed.  Chief Complaint: Obesity  Interim History: She is down 5 lbs from her last visit.  She saw her PCP at [facility] and had some follow up labs done.   Erin [name], a 63 year old patient with a history of obesity, type 2 diabetes, vitamin D  and B12 deficiencies, and hyperlipidemia, presents for a follow-up visit regarding her obesity treatment plan. The patient's current medications include Mounjaro  15 mg weekly, Jardiance  10 mg daily, Crestor  10 mg daily, Metformin 1000 mg twice a day, Lopressor  25 mg twice a day, Losartan  100 mg daily, Ergocalciferol  50,000 units once a week for vitamin D  deficiency, and Vitamin B12 1000 mcg orally daily.  The patient reports adherence to the prescribed medication regimen and has been making efforts to improve her diet, particularly by avoiding late-night eating. Despite some indulgence over the holiday period, the patient has lost weight and built muscle mass. She reports feeling well overall, with no new or worsening symptoms.  However, the patient's vitamin B12 levels remain low despite supplementation, which may be due to poor absorption caused by her medication. The patient has been taking 1000 mcg of vitamin B12 daily for the past two months and reports consistent use. Despite this, her B12 levels remain below the desired range, and she may need to increase her dosage or consider B12 injections in the future.  The patient's A1c levels have remained stable at 7.2, which is higher than desired. The patient's other lab results, including electrolytes, kidney function, liver function, and cholesterol levels, are all within normal limits. The patient's vitamin D  levels have improved and are now within the normal range.  She is retired and spends a significant amount of time sitting, which she recognizes as a barrier to her weight  loss and overall health improvement. Erin [name] is here to discuss her progress with her obesity treatment plan. She is on the Category 3 Plan and states she is following her eating plan approximately 50 % of the time. She states she is not exercising 0 minutes 0 times per week.   OBJECTIVE: Visit Diagnoses: Problem List Items Addressed This Visit     Hypertension associated with diabetes (HCC)   Relevant Medications   empagliflozin  (JARDIANCE ) 10 MG TABS tablet   losartan  (COZAAR ) 100 MG tablet   rosuvastatin  (CRESTOR ) 10 MG tablet   Diabetes mellitus (HCC) - Primary   Relevant Medications   empagliflozin  (JARDIANCE ) 10 MG TABS tablet   losartan  (COZAAR ) 100 MG tablet   rosuvastatin  (CRESTOR ) 10 MG tablet   Morbid obesity (HCC)   Relevant Medications   empagliflozin  (JARDIANCE ) 10 MG TABS tablet   Vitamin D  deficiency   Relevant Medications   Vitamin D , Ergocalciferol , (DRISDOL ) 1.25 MG (50000 UNIT) CAPS capsule   B12 deficiency   Relevant Medications   cyanocobalamin (VITAMIN B12) 1000 MCG tablet   Other Visit Diagnoses       Type 2 diabetes mellitus with hyperlipidemia (HCC)       Relevant Medications   empagliflozin  (JARDIANCE ) 10 MG TABS tablet   losartan  (COZAAR ) 100 MG tablet   rosuvastatin  (CRESTOR ) 10 MG tablet     Obesity Compliant with dietary changes, lost almost ten pounds of adipose mass and increased muscle mass. Blood pressure and heart rate within normal limits. Discussed benefits of continued dietary compliance and increased physical activity. - Continue Mounjaro  15 mg weekly - Encourage continued  dietary compliance - Recommend increasing physical activity, including using a stationary bike at home and considering free classes offered by [name]- [employer] program  Type 2 Diabetes Mellitus A1c at 7.2%. Glucose levels slightly elevated but stable. Discussed importance of maintaining glucose control and potential need for medication  adjustments if levels do not improve. Continue Mounjaro  15 mg weekly- filled by PCP - Continue/refill Jardiance  10 mg daily - Continue metformin 1000 mg BID - Monitor glucose levels regularly - Recheck A1c at next follow-up  Hypertension Blood pressure well-controlled with current medications. Discussed continued compliance with medication to maintain control. - Continue Lopressor  25 mg BID - Continue/refill losartan  100 mg daily Continue to work on nutrition plan to promote weight loss and improve BP control.   Hyperlipidemia Cholesterol levels within acceptable range with current treatment. Discussed continued compliance with medication to maintain levels. - Continue/refill Crestor  10 mg daily Continue to work on nutrition plan -decreasing simple carbohydrates, increasing lean proteins, decreasing saturated fats and cholesterol , avoiding trans fats and exercise as able to promote weight loss, improve lipids and decrease cardiovascular risks.  Vitamin B12 Deficiency Vitamin B12 level remains low at 125 pg/mL despite supplementation. Possible malabsorption due to medications. Discussed increasing dosage and potential need for B12 injections if levels do not improve. Explained that B12 is water-soluble and excess is excreted, reducing risk of toxicity. - Increase vitamin B12 to 2000 mcg daily - Consider B12 injections if levels do not improve - Recheck B12 levels at next follow-up  Vitamin D  Deficiency Vitamin D  level stable at 53.2 ng/mL with current supplementation. Discussed continued supplementation to maintain levels. - Continue ergocalciferol  50,000 units once a week  General Health Maintenance Encouraged to increase physical activity to maintain muscle strength and overall health. Discussed benefits of free exercise classes by [name] and using a stationary bike at home. - Encourage participation in free exercise classes offered by [name]  [employer] - Recommend using a stationary bike at home for 10-15 minutes daily  Follow-up - Schedule next follow-up appointment for February 12th at 10:30 AM - Recheck labs including A1c, B12, and other relevant parameters at next follow-up.  Vitals Temp: 99 F (37.2 C) BP: 128/75 Pulse Rate: 68 SpO2: 99 %   Anthropometric Measurements Height: 5' 8 (1.727 m) Weight: (!) 302 lb (137 kg) BMI (Calculated): 45.93 Weight at Last Visit: 307 lb Weight Lost Since Last Visit: 5 lb Weight Gained Since Last Visit: 0 Starting Weight: 330 lb Total Weight Loss (lbs): 28 lb (12.7 kg)   Body Composition  Body Fat %: 53.4 % Fat Mass (lbs): 161.4 lbs Muscle Mass (lbs): 134 lbs Total Body Water (lbs): 104.2 lbs Visceral Fat Rating : 19   Other Clinical Data Fasting: yes Labs: no Today's Visit #: 20 Starting Date: 07/26/18     ASSESSMENT AND PLAN:  Diet: Erin [name] is currently in the action stage of change. As such, her goal is to continue with weight loss efforts. She has agreed to Category 3 Plan.  Exercise: Erin [name] has been instructed to try a geriatric exercise plan and that some exercise is better than none for weight loss and overall health benefits.   Behavior Modification:  We discussed the following Behavioral Modification Strategies today: increasing lean protein intake, decreasing simple carbohydrates, increasing vegetables, increase H2O intake, increase high fiber foods, no skipping meals, better snacking choices, avoiding temptations, and planning for success. We discussed various medication options to help Erin [name] with her  weight loss efforts and we both agreed to continue current plan to promote weight loss and discussed some options to increase activity level.  Return in about 4 weeks (around 10/21/2023).SABRA She was informed of the importance of frequent follow up visits to maximize her success with intensive lifestyle modifications for her multiple health  conditions.  Attestation Statements:   Reviewed by clinician on day of visit: allergies, medications, problem list, medical history, surgical history, family history, social history, and previous encounter notes.   Time spent on visit including pre-visit chart review and post-visit care and charting was 35 minutes.    Amond Speranza, PA-C

## 2023-10-17 ENCOUNTER — Other Ambulatory Visit (INDEPENDENT_AMBULATORY_CARE_PROVIDER_SITE_OTHER): Payer: Self-pay | Admitting: Physician Assistant

## 2023-10-17 ENCOUNTER — Other Ambulatory Visit (HOSPITAL_COMMUNITY): Payer: Self-pay

## 2023-10-17 DIAGNOSIS — E538 Deficiency of other specified B group vitamins: Secondary | ICD-10-CM

## 2023-10-22 ENCOUNTER — Encounter (INDEPENDENT_AMBULATORY_CARE_PROVIDER_SITE_OTHER): Payer: Self-pay | Admitting: Physician Assistant

## 2023-10-22 ENCOUNTER — Ambulatory Visit (INDEPENDENT_AMBULATORY_CARE_PROVIDER_SITE_OTHER): Payer: 59 | Admitting: Physician Assistant

## 2023-10-22 ENCOUNTER — Other Ambulatory Visit (HOSPITAL_COMMUNITY): Payer: Self-pay

## 2023-10-22 VITALS — BP 125/75 | HR 81 | Temp 98.4°F | Ht 68.0 in | Wt 304.0 lb

## 2023-10-22 DIAGNOSIS — E1169 Type 2 diabetes mellitus with other specified complication: Secondary | ICD-10-CM | POA: Diagnosis not present

## 2023-10-22 DIAGNOSIS — I152 Hypertension secondary to endocrine disorders: Secondary | ICD-10-CM | POA: Diagnosis not present

## 2023-10-22 DIAGNOSIS — E785 Hyperlipidemia, unspecified: Secondary | ICD-10-CM | POA: Diagnosis not present

## 2023-10-22 DIAGNOSIS — E1159 Type 2 diabetes mellitus with other circulatory complications: Secondary | ICD-10-CM | POA: Diagnosis not present

## 2023-10-22 DIAGNOSIS — Z7985 Long-term (current) use of injectable non-insulin antidiabetic drugs: Secondary | ICD-10-CM

## 2023-10-22 DIAGNOSIS — Z6841 Body Mass Index (BMI) 40.0 and over, adult: Secondary | ICD-10-CM

## 2023-10-22 DIAGNOSIS — E559 Vitamin D deficiency, unspecified: Secondary | ICD-10-CM

## 2023-10-22 DIAGNOSIS — E538 Deficiency of other specified B group vitamins: Secondary | ICD-10-CM

## 2023-10-22 DIAGNOSIS — Z7984 Long term (current) use of oral hypoglycemic drugs: Secondary | ICD-10-CM

## 2023-10-22 MED ORDER — EMPAGLIFLOZIN 10 MG PO TABS: 10.0000 mg | ORAL_TABLET | Freq: Every day | ORAL | 0 refills | Status: DC

## 2023-10-22 MED ORDER — VITAMIN D (ERGOCALCIFEROL) 1.25 MG (50000 UNIT) PO CAPS: 50000.0000 [IU] | ORAL_CAPSULE | ORAL | 0 refills | Status: DC

## 2023-10-22 MED ORDER — LOSARTAN POTASSIUM 100 MG PO TABS: 100.0000 mg | ORAL_TABLET | Freq: Every day | ORAL | 0 refills | Status: DC

## 2023-10-22 MED ORDER — VITAMIN B-12 1000 MCG PO TABS: 1000.0000 ug | ORAL_TABLET | Freq: Every day | ORAL | 0 refills | Status: DC

## 2023-10-22 MED ORDER — MOUNJARO 15 MG/0.5ML ~~LOC~~ SOAJ
15.0000 mg | SUBCUTANEOUS | 3 refills | Status: AC
  Filled 2023-10-22 – 2023-11-14 (×2): qty 2, 28d supply, fill #0
  Filled 2023-12-12: qty 2, 28d supply, fill #1
  Filled 2024-01-08: qty 2, 28d supply, fill #2
  Filled 2024-04-02: qty 2, 28d supply, fill #3

## 2023-10-22 NOTE — Progress Notes (Signed)
SUBJECTIVE: Discussed the use of AI scribe software for clinical note transcription with the patient, who gave verbal consent to proceed.  Chief Complaint: Obesity  Interim History: She is up 2 lbs from last visit.  Down 26 lbs overall TBW loss of 7.9% She has a history of type 2 diabetes, hypertension, hyperlipidemia, and vitamin D and B12 deficiencies.  Senetra is here to discuss her progress with her obesity treatment plan. She is on the Category 3 Plan and states she is following her eating plan approximately 40 % of the time. She states she is exercising walking 15 minutes 4 times per week. MARLITA KEIL is a 63 year old female with obesity who presents for follow-up of her obesity treatment plan.  She recently traveled out of town for a family funeral, during which she ate out frequently, including breakfast, lunch, and dinner, deviating from her usual routine. She feels bloated and attributes this to increased intake of fast food and lack of physical activity during the trip.  Her typical daily diet includes scrambled eggs and [place] bacon for breakfast, sometimes with toast and cheese, and coffee. She often skips lunch but may have a cup of yogurt. Dinner usually consists of a salad with grilled chicken or tuna. She acknowledges not consuming enough protein, estimating less than 10 ounces of chicken per meal and not reaching the recommended 100 grams of protein per day. She does not snack between meals and reports that her appetite is generally suppressed, allowing her to go most of the day without feeling hungry. She has previously used Premier protein shakes but has not done so recently.  Her current medications include Mounjaro 15 mg weekly, Jardiance 10 mg daily, metformin 1000 mg twice daily, losartan 100 mg daily, Crestor 10 mg daily, ergocalciferol 50,000 units once weekly, and vitamin B12 1,000 micrograms daily.   No issues with Mounjaro, such as constipation, diarrhea, nausea,  vomiting, or difficulty swallowing. She previously experienced a sensation of a 'knot' in her throat, which resolved after increasing her water intake. Regarding Jardiance, she previously experienced yeast infections but has not had recent issues after increasing her water intake.  Plan fasting IC and labs next visit.   OBJECTIVE: Visit Diagnoses: Problem List Items Addressed This Visit     Hypertension associated with diabetes (HCC)   Relevant Medications   tirzepatide (MOUNJARO) 15 MG/0.5ML Pen   losartan (COZAAR) 100 MG tablet   empagliflozin (JARDIANCE) 10 MG TABS tablet   Diabetes mellitus (HCC) - Primary   Relevant Medications   tirzepatide (MOUNJARO) 15 MG/0.5ML Pen   losartan (COZAAR) 100 MG tablet   empagliflozin (JARDIANCE) 10 MG TABS tablet   Morbid obesity (HCC)   Relevant Medications   tirzepatide (MOUNJARO) 15 MG/0.5ML Pen   empagliflozin (JARDIANCE) 10 MG TABS tablet   Vitamin D deficiency   Relevant Medications   Vitamin D, Ergocalciferol, (DRISDOL) 1.25 MG (50000 UNIT) CAPS capsule   BMI 45.0-49.9, adult (HCC)   Relevant Medications   tirzepatide (MOUNJARO) 15 MG/0.5ML Pen   empagliflozin (JARDIANCE) 10 MG TABS tablet   B12 deficiency   Relevant Medications   cyanocobalamin (VITAMIN B12) 1000 MCG tablet   Other Visit Diagnoses       Type 2 diabetes mellitus with hyperlipidemia (HCC)       Relevant Medications   tirzepatide (MOUNJARO) 15 MG/0.5ML Pen   losartan (COZAAR) 100 MG tablet   empagliflozin (JARDIANCE) 10 MG TABS tablet     Obesity 63 year old female with obesity,  currently on Mounjaro 15 mg weekly. Recent travel led to increased caloric intake and decreased physical activity, resulting in bloating and fluid retention. Discussed the importance of maintaining a balanced diet and regular physical activity. Emphasized the need to avoid under-eating to prevent metabolic slowdown. Explained that under-eating can lower metabolism, making weight loss  harder. Discussed the potential benefit of a metabolism test to determine caloric needs. Informed her that the test requires fasting for 8 hours and takes about 20 minutes. Discussed the importance of protein intake, recommending Premier protein shakes to meet daily protein goals. - Encourage resumption of regular physical activity - Advise maintaining a balanced diet with adequate caloric intake - Recommend protein supplementation with Premier protein shakes - Schedule metabolism test for November 20, 2023 at 8:30 AM  Type 2 Diabetes Mellitus On Mounjaro 15mg  weekly, Jardiance 10 mg daily and metformin 1000 mg BID. No recent issues with medication, though previously experienced yeast infections with Jardiance, which resolved with increased water intake. Discussed the importance of adequate caloric intake to prevent metabolic slowdown, especially with current medications. On Statin On ARB.  Lab Results  Component Value Date   HGBA1C 7.6 (H) 04/22/2023   HGBA1C 7.6 (H) 10/16/2022   HGBA1C 7.2 (H) 06/18/2022   Lab Results  Component Value Date   MICROALBUR <0.7 12/09/2019   LDLCALC 74 04/22/2023   CREATININE 0.63 04/22/2023  A1C not at goal.  Continue/refill Mounjaro 15 mg weekly - Continue/refill Jardiance 10 mg daily - Encourage increased water intake to prevent yeast infections She is working  on nutrition plan to decrease simple carbohydrates, increase lean proteins and exercise to promote weight loss and improve glycemic control .  Hypertension Blood pressure well-controlled at 125/75 mmHg on losartan 100 mg daily. No side effects. No symptoms of hypotension . Renal function stable GFR.  - Continue/refill losartan 100 mg daily Continue to work on nutrition plan to promote weight loss and improve BP control.   Hyperlipidemia On Crestor 10 mg daily. No side effects.  The 10-year ASCVD risk score (Arnett DK, et al., 2019) is: 12.6%   Values used to calculate the score:     Age: 46  years     Sex: Female     Is Non-Hispanic African American: Yes     Diabetic: Yes     Tobacco smoker: No     Systolic Blood Pressure: 125 mmHg     Is BP treated: Yes     HDL Cholesterol: 48 mg/dL     Total Cholesterol: 138 mg/dL  - Continue Crestor 10 mg daily. Reports no refill needed today.  Continue Mounjaro Continue to work on Engineer, technical sales -decreasing simple carbohydrates, increasing lean proteins, decreasing saturated fats and cholesterol , avoiding trans fats and exercise as able to promote weight loss, improve lipids and decrease cardiovascular risks.  Vitamin D Deficiency On ergocalciferol 50,000 units once weekly. No N/V or muscle weakness with Ergocalciferol.  Last vitamin D Lab Results  Component Value Date   VD25OH 53.2 04/22/2023   Low vitamin D levels can be associated with adiposity and may result in leptin resistance and weight gain. Also associated with fatigue.  Currently on vitamin D supplementation without any adverse effects such as nausea, vomiting or muscle weakness.  - Continue/refill ergocalciferol 50,000 units once weekly  Vitamin B12 Deficiency On vitamin B12 1,000 mcg daily. No side effects. Last B 12 level 252 - Continue vitamin B12 1,000 mcg daily  Follow-up - Follow-up appointment on November 20, 2023  at 8:30 AM for metabolism test. May also want to repeat fasting labs at next visit as well.   Vitals Temp: 98.4 F (36.9 C) BP: 125/75 Pulse Rate: 81 SpO2: 98 %   Anthropometric Measurements Height: 5\' 8"  (1.727 m) Weight: (!) 304 lb (137.9 kg) BMI (Calculated): 46.23 Weight at Last Visit: 302 lb Weight Lost Since Last Visit: 0 Weight Gained Since Last Visit: 2 lb Starting Weight: 330 lb Total Weight Loss (lbs): 26 lb (11.8 kg)   Body Composition  Body Fat %: 55.2 % Fat Mass (lbs): 168.2 lbs Muscle Mass (lbs): 129.8 lbs Visceral Fat Rating : 20   Other Clinical Data Fasting: yes Labs: no Today's Visit #: 21 Starting Date:  07/26/18     ASSESSMENT AND PLAN:  Diet: Lilya is currently in the action stage of change. As such, her goal is to continue with weight loss efforts. She has agreed to Category 3 Plan.  Exercise: Erin has been instructed to work up to a goal of 150 minutes of combined cardio and strengthening exercise per week for weight loss and overall health benefits.   Behavior Modification:  We discussed the following Behavioral Modification Strategies today: increasing lean protein intake, decreasing simple carbohydrates, increasing vegetables, increase H2O intake, increase high fiber foods, no skipping meals, meal planning and cooking strategies, better snacking choices, emotional eating strategies , avoiding temptations, and planning for success. We discussed various medication options to help Loralee with her weight loss efforts and we both agreed to continue [place], Jardiance and metformin for Type 2 diabetes and continue to work on nutritional and behavioral strategies to promote weight loss.  .  Return in about 4 weeks (around 11/19/2023) for Fasting Lab, Fasting IC.[name] She was informed of the importance of frequent follow up visits to maximize her success with intensive lifestyle modifications for her multiple health conditions.  Attestation Statements:   Reviewed by clinician on day of visit: allergies, medications, problem list, medical history, surgical history, family history, social history, and previous encounter notes.   Time spent on visit including pre-visit chart review and post-visit care and charting was 33 minutes.    Kase Shughart, PA-C

## 2023-11-14 ENCOUNTER — Other Ambulatory Visit (HOSPITAL_COMMUNITY): Payer: Self-pay

## 2023-11-16 ENCOUNTER — Other Ambulatory Visit (INDEPENDENT_AMBULATORY_CARE_PROVIDER_SITE_OTHER): Payer: Self-pay | Admitting: Physician Assistant

## 2023-11-16 DIAGNOSIS — E538 Deficiency of other specified B group vitamins: Secondary | ICD-10-CM

## 2023-11-19 ENCOUNTER — Telehealth (INDEPENDENT_AMBULATORY_CARE_PROVIDER_SITE_OTHER): Payer: Self-pay

## 2023-11-19 NOTE — Telephone Encounter (Signed)
 See my chart message

## 2023-11-20 ENCOUNTER — Ambulatory Visit (INDEPENDENT_AMBULATORY_CARE_PROVIDER_SITE_OTHER): Payer: 59 | Admitting: Physician Assistant

## 2023-11-20 ENCOUNTER — Encounter (INDEPENDENT_AMBULATORY_CARE_PROVIDER_SITE_OTHER): Payer: Self-pay | Admitting: Physician Assistant

## 2023-11-20 VITALS — BP 116/70 | HR 63 | Temp 98.3°F | Ht 68.0 in | Wt 304.0 lb

## 2023-11-20 DIAGNOSIS — E1169 Type 2 diabetes mellitus with other specified complication: Secondary | ICD-10-CM

## 2023-11-20 DIAGNOSIS — E785 Hyperlipidemia, unspecified: Secondary | ICD-10-CM

## 2023-11-20 DIAGNOSIS — I152 Hypertension secondary to endocrine disorders: Secondary | ICD-10-CM

## 2023-11-20 DIAGNOSIS — E1159 Type 2 diabetes mellitus with other circulatory complications: Secondary | ICD-10-CM | POA: Diagnosis not present

## 2023-11-20 DIAGNOSIS — R0602 Shortness of breath: Secondary | ICD-10-CM | POA: Diagnosis not present

## 2023-11-20 DIAGNOSIS — Z7985 Long-term (current) use of injectable non-insulin antidiabetic drugs: Secondary | ICD-10-CM

## 2023-11-20 DIAGNOSIS — Z7984 Long term (current) use of oral hypoglycemic drugs: Secondary | ICD-10-CM

## 2023-11-20 DIAGNOSIS — Z6841 Body Mass Index (BMI) 40.0 and over, adult: Secondary | ICD-10-CM

## 2023-11-20 DIAGNOSIS — E559 Vitamin D deficiency, unspecified: Secondary | ICD-10-CM

## 2023-11-20 DIAGNOSIS — E538 Deficiency of other specified B group vitamins: Secondary | ICD-10-CM

## 2023-11-20 MED ORDER — EMPAGLIFLOZIN 10 MG PO TABS: 10.0000 mg | ORAL_TABLET | Freq: Every day | ORAL | 0 refills | Status: DC

## 2023-11-20 MED ORDER — ROSUVASTATIN CALCIUM 10 MG PO TABS: 10.0000 mg | ORAL_TABLET | Freq: Every day | ORAL | 1 refills | Status: AC

## 2023-11-20 MED ORDER — GLUCOSE BLOOD VI STRP: ORAL_STRIP | 12 refills | Status: DC

## 2023-11-20 MED ORDER — VITAMIN D (ERGOCALCIFEROL) 1.25 MG (50000 UNIT) PO CAPS: 50000.0000 [IU] | ORAL_CAPSULE | ORAL | 0 refills | Status: DC

## 2023-11-20 MED ORDER — LOSARTAN POTASSIUM 100 MG PO TABS: 100.0000 mg | ORAL_TABLET | Freq: Every day | ORAL | 0 refills | Status: DC

## 2023-11-20 MED ORDER — MOUNJARO 15 MG/0.5ML ~~LOC~~ SOAJ: 15.0000 mg | SUBCUTANEOUS | 3 refills | Status: DC

## 2023-11-20 NOTE — Progress Notes (Signed)
 SUBJECTIVE: Discussed the use of AI scribe software for clinical note transcription with the patient, who gave verbal consent to proceed.  Chief Complaint: Obesity  Interim History: She has maintained her weight loss since last visit.  Down 26 lb overall TBW loss of 7.9%  Erin [name] is here to discuss her progress with her obesity treatment plan. She is on the Category 3 Plan and states she is following her eating plan approximately 50 % of the time. She states she is exercising walking for 10-12 minutes 4 times per week. The patient is a 63 year old with obesity who presents for follow-up of her obesity treatment plan.  She is currently on Mounjaro 15 mg weekly, Jardiance 10 mg daily, and metformin 1000 mg twice daily for diabetic control. For hyperlipidemia, she takes Crestor 10 mg daily. Her blood pressure is managed with losartan 100 mg daily and metoprolol 25 mg twice daily. She also takes ergocalciferol 50,000 units once weekly for vitamin D deficiency and vitamin B12 1000 mcg daily for B12 deficiency.  She follows a category three nutrition plan approximately 50% of the time and is consuming about 1843 calories daily, which is slightly above the recommended 1500-1600 calories for her plan. She feels that the plan provides 'too much food' and has not tried any other plans. She is considering a category two plan to feel less 'stuffed' and promote better weight loss.  She engages in physical activity by walking 10 to 12 minutes four times per week. She notes that when she was working, she was more active and burned more calories. She is trying to increase her activity level with the help of her sister, who encourages her to walk more, especially as the weather gets warmer.  Her diabetes is managed with Mounjaro, Jardiance, and metformin. Her last A1c was 7.6, which is higher than desired. She is monitoring blood glucose levels more frequently to better understand the impact of dietary  choices.  She is trying to stay well-hydrated, drinking about four 16.9-ounce bottles of water daily, but acknowledges this may not be sufficient given her medication regimen. She aims to increase her water intake to five bottles daily.  She reports a recent pap smear that indicated a 'little yeast' for which she was given cream. She associates this with her use of Jardiance.  Repeat IC done today: REE 1843 calories. (Previous REE in 2019 2253 calories) Predicted REE 2003 calories.     Her current REE is 160 calories lower than predicted and 410 calories less than her original REE.   Fasting labs obtained today.  The patient was informed we would discuss the lab results at the next visit unless there is a critical issue that needs to be addressed sooner. The patient agreed to keep the next visit at the agreed upon time to discuss these results.   OBJECTIVE: Visit Diagnoses: Problem List Items Addressed This Visit     Hypertension associated with diabetes (HCC)   Relevant Medications   empagliflozin (JARDIANCE) 10 MG TABS tablet   tirzepatide (MOUNJARO) 15 MG/0.5ML Pen   losartan (COZAAR) 100 MG tablet   rosuvastatin (CRESTOR) 10 MG tablet   Diabetes mellitus (HCC)   Relevant Medications   empagliflozin (JARDIANCE) 10 MG TABS tablet   tirzepatide (MOUNJARO) 15 MG/0.5ML Pen   losartan (COZAAR) 100 MG tablet   rosuvastatin (CRESTOR) 10 MG tablet   glucose blood test strip   Other Relevant Orders   CMP14+EGFR   Hemoglobin A1c   Insulin,  random   Morbid obesity (HCC)   Relevant Medications   empagliflozin (JARDIANCE) 10 MG TABS tablet   tirzepatide (MOUNJARO) 15 MG/0.5ML Pen   Other Relevant Orders   TSH   Vitamin D deficiency   Relevant Medications   Vitamin D, Ergocalciferol, (DRISDOL) 1.25 MG (50000 UNIT) CAPS capsule   Other Relevant Orders   VITAMIN D 25 Hydroxy (Vit-D Deficiency, Fractures)   BMI 45.0-49.9, adult (HCC)   Relevant Medications   empagliflozin (JARDIANCE)  10 MG TABS tablet   tirzepatide (MOUNJARO) 15 MG/0.5ML Pen   B12 deficiency   Relevant Orders   Vitamin B12   CBC with Differential/Platelet   Other Visit Diagnoses       SOBOE (shortness of breath on exertion)    -  Primary     Type 2 diabetes mellitus with hyperlipidemia (HCC)       Relevant Medications   empagliflozin (JARDIANCE) 10 MG TABS tablet   tirzepatide (MOUNJARO) 15 MG/0.5ML Pen   losartan (COZAAR) 100 MG tablet   rosuvastatin (CRESTOR) 10 MG tablet   Other Relevant Orders   Lipid Panel With LDL/HDL Ratio     SOBOE Elzena notes increasing shortness of breath with exercising and seems to be worsening over time with weight gain. She notes getting out of breath sooner with activity than she used to. This has gotten worse recently. Leiya denies shortness of breath at rest or orthopnea. Repeat IC done today: REE 1843 calories. (Previous REE in 2019 2253 calories) Predicted REE 2003 calories.     Her current REE is 160 calories lower than predicted and 410 calories less than her original REE.  Plan: We discuss changing her nutrition plan to a category 2 plan ( 1200-1300 cals and 85 grams of protein daily) to promote gradual weight loss.  . She has agreed to work on weight loss and gradually increase exercise to treat her exercise induced shortness of breath. Will continue to monitor closely.    Type 2 Diabetes Mellitus Currently on Mounjaro 15 mg weekly, Jardiance 10 mg daily, and metformin 1000 mg twice daily. Last A1c was 7.6, above target. Weight loss through dietary changes and increased physical activity may improve glucose control. Discussed the importance of hydration due to Jardiance, which can increase dehydration and yeast infection risk. On STATIN.  On ARB Lab Results  Component Value Date   HGBA1C 7.6 (H) 04/22/2023   HGBA1C 7.6 (H) 10/16/2022   HGBA1C 7.2 (H) 06/18/2022   Lab Results  Component Value Date   MICROALBUR <0.7 12/09/2019   LDLCALC 74  04/22/2023   CREATININE 0.63 04/22/2023   COntinue Mounjaro, Jardiance and metformin.  - Encourage walking 10-12 minutes after dinner for glucose control. - Increase water intake to at least five 16.9-ounce bottles per day. - Send prescription for glucose test strips to pharmacy. - Check fasting blood sugars at least three times a week and record results. - Order fasting labs including fasting insulin levels. Meds ordered this encounter  Medications   empagliflozin (JARDIANCE) 10 MG TABS tablet    Sig: Take 1 tablet (10 mg total) by mouth daily before breakfast.    Dispense:  30 tablet    Refill:  0   tirzepatide (MOUNJARO) 15 MG/0.5ML Pen    Sig: Inject 15 mg into the skin once a week.    Dispense:  2 mL    Refill:  3   Vitamin D, Ergocalciferol, (DRISDOL) 1.25 MG (50000 UNIT) CAPS capsule    Sig: Take  1 capsule (50,000 Units total) by mouth every 7 (seven) days.    Dispense:  5 capsule    Refill:  0   losartan (COZAAR) 100 MG tablet    Sig: Take 1 tablet (100 mg total) by mouth daily.    Dispense:  30 tablet    Refill:  0   rosuvastatin (CRESTOR) 10 MG tablet    Sig: Take 1 tablet (10 mg total) by mouth daily. Need ov.    Dispense:  90 tablet    Refill:  1   glucose blood test strip    Sig: Use as instructed    Dispense:  100 each    Refill:  12    Obesity On a category three nutrition plan. REE today of 1843 which is lower than predicted REE of 2003. And lower than previous REE at 2253. Given lower REE, discussed changing to Cat. 2 nutrition plan.   Reports feeling the current plan provides too much food, especially since she is no longer working.  Walking 10-12 minutes four times per week.  We discuss changing to a category two plan may promote better weight loss . - Switch to a category two nutrition plan to reduce calorie intake/promote appropriate calorie deficit with reduced activity. - Encouraged to increase walking 10-12 minutes 5-7 times per week, especially  after dinner. - Consider lower-calorie ice cream alternatives like Yazoo or Outshine bars. - Increase daily water intake to at least five 16.9-ounce bottles per day.  Vulvovaginal Candidiasis Recent diagnosis likely related to Jardiance use. Advised to stay well hydrated to manage this side effect. - Increase daily water intake to at least five 16.9-ounce bottles per day. Monitor closely. Monitor renal.   Hypertension On losartan 100 mg daily and metoprolol 25 mg twice daily for management. BP Readings from Last 3 Encounters:  11/20/23 116/70  10/22/23 125/75  09/23/23 128/75   No side effects with medications. No hypotension.  Continue current medications Continue to work on nutrition plan to promote weight loss and improve BP control.  Monitor renal/labs  Hyperlipidemia LDL is not at goal of < 70 Medication(s): Crestor 10 mg daily.  Cardiovascular risk factors: diabetes mellitus, dyslipidemia, family history of premature cardiovascular disease, hypertension, microalbuminuria, obesity (BMI >= 30 kg/m2), and sedentary lifestyle  Lab Results  Component Value Date   CHOL 138 04/22/2023   HDL 48 04/22/2023   LDLCALC 74 04/22/2023   TRIG 83 04/22/2023   CHOLHDL 2.9 06/18/2022   CHOLHDL 3.4 06/19/2021   CHOLHDL 3.2 01/11/2021   Lab Results  Component Value Date   ALT 9 04/22/2023   AST 13 04/22/2023   ALKPHOS 95 04/22/2023   BILITOT 0.2 04/22/2023   The 10-year ASCVD risk score (Arnett DK, et al., 2019) is: 10.4%   Values used to calculate the score:     Age: 59 years     Sex: Female     Is Non-Hispanic African American: Yes     Diabetic: Yes     Tobacco smoker: No     Systolic Blood Pressure: 116 mmHg     Is BP treated: Yes     HDL Cholesterol: 48 mg/dL     Total Cholesterol: 138 mg/dL  Plan: Continue /refill crestor 10 mg daily. Continue to work on nutrition plan -decreasing simple carbohydrates, increasing lean proteins, decreasing saturated fats and cholesterol  , avoiding trans fats and exercise as able to promote weight loss, improve lipids and decrease cardiovascular risks. Also on Mounjaro and should  lower CV risks. Recheck fasting lipids today.   Vitamin D Deficiency Vitamin D is at goal of 50.  Most recent vitamin D level was 53.2. She is on  prescription ergocalciferol 50,000 IU weekly. No N/V or muscle weakness.  Lab Results  Component Value Date   VD25OH 53.2 04/22/2023   VD25OH 48.5 10/16/2022   VD25OH 59.0 06/19/2021    Plan: Continue and refill  prescription ergocalciferol 50,000 IU weekly Low vitamin D levels can be associated with adiposity and may result in leptin resistance and weight gain. Also associated with fatigue.  Currently on vitamin D supplementation without any adverse effects such as nausea, vomiting or muscle weakness.  Recheck vitamin D level today  Vitamin B12 Deficiency On vitamin B12 1000 mcg daily for management. No side effects.  Plan: continue B12, recheck level today.   Follow-up Scheduled for a follow-up appointment in four weeks. Will review labs, including fasting insulin levels, and discuss results at the next visit. Will contact if any lab results are significantly off.  Vitals Temp: 98.3 F (36.8 C) BP: 116/70 Pulse Rate: 63 SpO2: 100 %   Anthropometric Measurements Height: 5\' 8"  (1.727 m) Weight: (!) 304 lb (137.9 kg) BMI (Calculated): 46.23 Weight at Last Visit: 304 lb Weight Lost Since Last Visit: 0 Weight Gained Since Last Visit: 0 Starting Weight: 330 lb Total Weight Loss (lbs): 26 lb (11.8 kg)   Body Composition  Body Fat %: 54.8 % Fat Mass (lbs): 166.6 lbs Muscle Mass (lbs): 130.4 lbs Visceral Fat Rating : 20   Other Clinical Data Fasting: yes Labs: yes Today's Visit #: 22 Starting Date: 07/26/18     ASSESSMENT AND PLAN:  Diet: Katrin is currently in the action stage of change. As such, her goal is to continue with weight loss efforts and has agreed to the  Category 2 Plan.   Exercise:  All adults should avoid inactivity. Some activity is better than none, and adults who participate in any amount of physical activity, gain some health benefits. and For substantial health benefits, adults should do at least 150 minutes (2 hours and 30 minutes) a week of moderate-intensity, or 75 minutes (1 hour and 15 minutes) a week of vigorous-intensity aerobic physical activity, or an equivalent combination of moderate- and vigorous-intensity aerobic activity. Aerobic activity should be performed in episodes of at least 10 minutes, and preferably, it should be spread throughout the week.  Behavior Modification:  We discussed the following Behavioral Modification Strategies today: increasing lean protein intake, decreasing simple carbohydrates, increasing vegetables, increase H2O intake, increase high fiber foods, meal planning and cooking strategies, better snacking choices, emotional eating strategies , avoiding temptations, and planning for success. We discussed various medication options to help Natosha with her weight loss efforts and we both agreed to change nutrition plan to category 2 and continue current medications.  Return in about 4 weeks (around 12/18/2023).[name] She was informed of the importance of frequent follow up visits to maximize her success with intensive lifestyle modifications for her multiple health conditions.  Attestation Statements:   Reviewed by clinician on day of visit: allergies, medications, problem list, medical history, surgical history, family history, social history, and previous encounter notes.   Time spent on visit including pre-visit chart review and post-visit care and charting was 53 minutes  Ramzy Cappelletti,PA-C

## 2023-11-21 LAB — CMP14+EGFR
ALT: 13 IU/L (ref 0–32)
AST: 15 IU/L (ref 0–40)
Albumin: 4.2 g/dL (ref 3.9–4.9)
Alkaline Phosphatase: 100 IU/L (ref 44–121)
BUN/Creatinine Ratio: 15 (ref 12–28)
BUN: 10 mg/dL (ref 8–27)
Bilirubin Total: 0.3 mg/dL (ref 0.0–1.2)
CO2: 22 mmol/L (ref 20–29)
Calcium: 9.7 mg/dL (ref 8.7–10.3)
Chloride: 102 mmol/L (ref 96–106)
Creatinine, Ser: 0.67 mg/dL (ref 0.57–1.00)
Globulin, Total: 2.7 g/dL (ref 1.5–4.5)
Glucose: 108 mg/dL — ABNORMAL HIGH (ref 70–99)
Potassium: 4.6 mmol/L (ref 3.5–5.2)
Sodium: 141 mmol/L (ref 134–144)
Total Protein: 6.9 g/dL (ref 6.0–8.5)
eGFR: 99 mL/min/{1.73_m2} (ref >59)

## 2023-11-21 LAB — CBC WITH DIFFERENTIAL/PLATELET
Basophils Absolute: 0 10*3/uL (ref 0.0–0.2)
Basos: 0 %
EOS (ABSOLUTE): 0.1 10*3/uL (ref 0.0–0.4)
Eos: 2 %
Hematocrit: 39.5 % (ref 34.0–46.6)
Hemoglobin: 12 g/dL (ref 11.1–15.9)
Immature Grans (Abs): 0 10*3/uL (ref 0.0–0.1)
Immature Granulocytes: 0 %
Lymphocytes Absolute: 1.8 10*3/uL (ref 0.7–3.1)
Lymphs: 29 %
MCH: 24.5 pg — ABNORMAL LOW (ref 26.6–33.0)
MCHC: 30.4 g/dL — ABNORMAL LOW (ref 31.5–35.7)
MCV: 81 fL (ref 79–97)
Monocytes Absolute: 0.4 10*3/uL (ref 0.1–0.9)
Monocytes: 7 %
Neutrophils Absolute: 3.7 10*3/uL (ref 1.4–7.0)
Neutrophils: 62 %
Platelets: 371 10*3/uL (ref 150–450)
RBC: 4.89 x10E6/uL (ref 3.77–5.28)
RDW: 15.2 % (ref 11.7–15.4)
WBC: 6.1 10*3/uL (ref 3.4–10.8)

## 2023-11-21 LAB — LIPID PANEL WITH LDL/HDL RATIO
Cholesterol, Total: 148 mg/dL (ref 100–199)
HDL: 52 mg/dL (ref >39)
LDL Chol Calc (NIH): 79 mg/dL (ref 0–99)
LDL/HDL Ratio: 1.5 ratio (ref 0.0–3.2)
Triglycerides: 92 mg/dL (ref 0–149)
VLDL Cholesterol Cal: 17 mg/dL (ref 5–40)

## 2023-11-21 LAB — INSULIN, RANDOM: INSULIN: 14.6 u[IU]/mL (ref 2.6–24.9)

## 2023-11-21 LAB — VITAMIN D 25 HYDROXY (VIT D DEFICIENCY, FRACTURES): Vit D, 25-Hydroxy: 44.6 ng/mL (ref 30.0–100.0)

## 2023-11-21 LAB — VITAMIN B12: Vitamin B-12: 417 pg/mL (ref 232–1245)

## 2023-11-21 LAB — TSH: TSH: 3.45 u[IU]/mL (ref 0.450–4.500)

## 2023-11-21 LAB — HEMOGLOBIN A1C
Est. average glucose Bld gHb Est-mCnc: 154 mg/dL
Hgb A1c MFr Bld: 7 % — ABNORMAL HIGH (ref 4.8–5.6)

## 2023-11-25 ENCOUNTER — Other Ambulatory Visit (HOSPITAL_COMMUNITY): Payer: Self-pay

## 2023-11-25 MED ORDER — MOUNJARO 10 MG/0.5ML ~~LOC~~ SOAJ
10.0000 mg | SUBCUTANEOUS | 3 refills | Status: DC
Start: 2023-11-25 — End: 2024-01-21
  Filled 2023-11-25: qty 2, 28d supply, fill #0

## 2023-12-03 ENCOUNTER — Other Ambulatory Visit (INDEPENDENT_AMBULATORY_CARE_PROVIDER_SITE_OTHER): Payer: Self-pay | Admitting: Physician Assistant

## 2023-12-03 DIAGNOSIS — E559 Vitamin D deficiency, unspecified: Secondary | ICD-10-CM

## 2023-12-12 ENCOUNTER — Other Ambulatory Visit (HOSPITAL_COMMUNITY): Payer: Self-pay

## 2023-12-17 ENCOUNTER — Ambulatory Visit (INDEPENDENT_AMBULATORY_CARE_PROVIDER_SITE_OTHER): Admitting: Physician Assistant

## 2024-01-08 ENCOUNTER — Other Ambulatory Visit (HOSPITAL_COMMUNITY): Payer: Self-pay

## 2024-01-21 ENCOUNTER — Other Ambulatory Visit (HOSPITAL_COMMUNITY): Payer: Self-pay

## 2024-01-21 ENCOUNTER — Ambulatory Visit (INDEPENDENT_AMBULATORY_CARE_PROVIDER_SITE_OTHER): Admitting: Physician Assistant

## 2024-01-21 ENCOUNTER — Encounter (INDEPENDENT_AMBULATORY_CARE_PROVIDER_SITE_OTHER): Payer: Self-pay | Admitting: Physician Assistant

## 2024-01-21 VITALS — BP 117/73 | HR 70 | Temp 98.2°F | Ht 68.0 in | Wt 301.0 lb

## 2024-01-21 DIAGNOSIS — Z7984 Long term (current) use of oral hypoglycemic drugs: Secondary | ICD-10-CM

## 2024-01-21 DIAGNOSIS — E1159 Type 2 diabetes mellitus with other circulatory complications: Secondary | ICD-10-CM | POA: Diagnosis not present

## 2024-01-21 DIAGNOSIS — E785 Hyperlipidemia, unspecified: Secondary | ICD-10-CM

## 2024-01-21 DIAGNOSIS — D509 Iron deficiency anemia, unspecified: Secondary | ICD-10-CM

## 2024-01-21 DIAGNOSIS — Z6841 Body Mass Index (BMI) 40.0 and over, adult: Secondary | ICD-10-CM

## 2024-01-21 DIAGNOSIS — E559 Vitamin D deficiency, unspecified: Secondary | ICD-10-CM

## 2024-01-21 DIAGNOSIS — I152 Hypertension secondary to endocrine disorders: Secondary | ICD-10-CM

## 2024-01-21 DIAGNOSIS — E1169 Type 2 diabetes mellitus with other specified complication: Secondary | ICD-10-CM

## 2024-01-21 DIAGNOSIS — E538 Deficiency of other specified B group vitamins: Secondary | ICD-10-CM

## 2024-01-21 DIAGNOSIS — Z7985 Long-term (current) use of injectable non-insulin antidiabetic drugs: Secondary | ICD-10-CM

## 2024-01-21 DIAGNOSIS — Z862 Personal history of diseases of the blood and blood-forming organs and certain disorders involving the immune mechanism: Secondary | ICD-10-CM

## 2024-01-21 MED ORDER — LOSARTAN POTASSIUM 100 MG PO TABS: 100.0000 mg | ORAL_TABLET | Freq: Every day | ORAL | 0 refills | Status: AC

## 2024-01-21 MED ORDER — MOUNJARO 15 MG/0.5ML ~~LOC~~ SOAJ
15.0000 mg | SUBCUTANEOUS | 3 refills | Status: AC
  Filled 2024-01-21 – 2024-02-06 (×2): qty 2, 28d supply, fill #0
  Filled 2024-07-22: qty 2, 28d supply, fill #1
  Filled 2024-08-20: qty 2, 28d supply, fill #2
  Filled 2024-09-17: qty 2, 28d supply, fill #3

## 2024-01-21 MED ORDER — EMPAGLIFLOZIN 10 MG PO TABS: 10.0000 mg | ORAL_TABLET | Freq: Every day | ORAL | 0 refills | Status: AC

## 2024-01-21 MED ORDER — VITAMIN D (ERGOCALCIFEROL) 1.25 MG (50000 UNIT) PO CAPS: 50000.0000 [IU] | ORAL_CAPSULE | ORAL | 0 refills | Status: AC

## 2024-01-21 MED ORDER — VITAMIN B-12 1000 MCG PO TABS: 1000.0000 ug | ORAL_TABLET | Freq: Every day | ORAL | 0 refills | Status: AC

## 2024-01-21 MED ORDER — GLUCOSE BLOOD VI STRP: ORAL_STRIP | 12 refills | Status: AC

## 2024-01-21 NOTE — Progress Notes (Signed)
 SUBJECTIVE: Discussed the use of AI scribe software for clinical note transcription with the patient, who gave verbal consent to proceed.  Chief Complaint: Obesity  Interim History: She is down 3 lbs since last visit.  Down 28 lbs overall TBW loss of 8.5% since started with HWW  Erin [name] is here to discuss her progress with her obesity treatment plan. She is on the Category 2 Plan and states she is following her eating plan approximately 20 % of the time. She states she is exercising walking 15 minutes 7 times per week.  Erin [name] is a 63 year old female with obesity who presents for follow-up on her obesity treatment plan.  She has been adhering to a reduced calorie intake plan and has started walking every evening, resulting in a weight loss of three pounds since her last visit.  Her type 2 diabetes is managed with Mounjaro , Jardiance , and metformin 1000 mg twice a day. She experiences burning sensations if she does not drink enough water while on Jardiance . Her recent A1c is 7.0, down from 7.6, and her insulin  level is 14.6. She is working on reducing her intake of simple carbohydrates.  Her hypertension is controlled with losartan  and metoprolol  25 mg twice a day. Blood pressure is well-managed on this regimen.  For hyperlipidemia, she is taking Crestor . Her cholesterol levels have improved, with HDL at 52 and triglycerides below 150, but her LDL remains slightly elevated. She is trying to avoid fried foods and reduce fatty food intake.  She has a history of vitamin D  deficiency and is taking vitamin D  once a week. Her current level is 44.6. She is also taking vitamin B12, which has improved her levels, though they are not yet at the desired level of 500 or more.  She reports a history of low iron levels and has had iron infusions in the past. Her hemoglobin and hematocrit are normal, but indices suggest possible iron deficiency. She has had colonoscopies and endoscopies in the past  due to low iron, with findings of polyps that were not precancerous.  She underwent a partial hysterectomy due to heavy menstrual cycles. She is postmenopausal and is increasing her intake of green leafy vegetables to improve her iron levels. OBJECTIVE: Visit Diagnoses: Problem List Items Addressed This Visit     Hypertension associated with diabetes (HCC)   Relevant Medications   losartan  (COZAAR ) 100 MG tablet   empagliflozin  (JARDIANCE ) 10 MG TABS tablet   tirzepatide  (MOUNJARO ) 15 MG/0.5ML Pen   Diabetes mellitus (HCC) - Primary   Relevant Medications   losartan  (COZAAR ) 100 MG tablet   empagliflozin  (JARDIANCE ) 10 MG TABS tablet   glucose blood test strip   tirzepatide  (MOUNJARO ) 15 MG/0.5ML Pen   Morbid obesity (HCC)   Relevant Medications   empagliflozin  (JARDIANCE ) 10 MG TABS tablet   tirzepatide  (MOUNJARO ) 15 MG/0.5ML Pen   Vitamin D  deficiency   Relevant Medications   Vitamin D , Ergocalciferol , (DRISDOL ) 1.25 MG (50000 UNIT) CAPS capsule   BMI 45.0-49.9, adult (HCC)   Relevant Medications   empagliflozin  (JARDIANCE ) 10 MG TABS tablet   tirzepatide  (MOUNJARO ) 15 MG/0.5ML Pen   B12 deficiency   Relevant Medications   cyanocobalamin (VITAMIN B12) 1000 MCG tablet   Other Visit Diagnoses       Type 2 diabetes mellitus with hyperlipidemia (HCC)       Relevant Medications   losartan  (COZAAR ) 100 MG tablet   empagliflozin  (JARDIANCE ) 10 MG TABS tablet   tirzepatide  (MOUNJARO ) 15  MG/0.5ML Pen     History of iron deficiency anemia         Type 2 diabetes mellitus Glucose level is 108 mg/dL, and Z6X has improved to 7.0% from 7.6%. Insulin  level is 14.6, with a goal of 5 or below. Mounjaro , Jardiance , and Metformin are effectively managing diabetes. Dietary modifications were discussed to further improve glycemic control. Emphasized the importance of hydration to prevent side effects from Jardiance . - Continue Mounjaro , Jardiance , and Metformin as prescribed. - Encourage  dietary modifications to reduce simple carbohydrates. - Reinforce the importance of monitoring blood glucose levels. - Send glucose test strip order to pharmacy. - Advise maintaining adequate hydration to prevent side effects from Jardiance .  Hypertension Blood pressure is well-controlled on Losartan  and Metoprolol . - Continue Losartan  and Metoprolol  as prescribed. - Refill Losartan  prescription.  Hyperlipidemia Cholesterol levels are improving with current treatment. HDL is 52, triglycerides are below 150, and LDL is slightly elevated with a target of less than 55. Crestor  is prescribed, and she is advised to avoid fatty foods and increase physical activity. Dietary choices were discussed to further improve cholesterol levels. - Continue Crestor  as prescribed. - Encourage dietary modifications to reduce fatty food intake. - Promote physical activity to improve cholesterol levels.  Obesity She has been following the category three diet plan but has been advised to switch to category two for more effective weight loss. She has lost three pounds since the last visit and has increased physical activity by walking daily. Encouraged to continue walking to boost metabolism and aid in weight loss. If increased activity leads to increased hunger, advised to revert to category three or add snacks. - Provide a copy of the category two diet plan. - Encourage continuation of daily walking. - Advise switching to category two diet plan for more effective weight loss. - Monitor weight loss progress.  Iron deficiency anemia Hemoglobin and hematocrit are normal, but indices suggest possible iron deficiency. She has low iron and has had iron infusions in the past. Advised to increase dietary iron intake through green leafy vegetables and cooking with an iron skillet. Discussed the potential need for future iron infusions if levels remain low. - Encourage increased intake of green leafy vegetables and use of  an iron skillet for cooking. - Monitor iron levels and consider rechecking in 3-4 months.  Vitamin D  deficiency Vitamin D  level is 44.6, close to the goal of 50-70. She is on weekly vitamin D  supplementation. Discussed the importance of maintaining current supplementation to avoid high levels, which can cause adverse effects such as fatigue, muscle weakness, nausea, and vomiting. - Continue weekly vitamin D  supplementation. - Monitor vitamin D  levels.  Vitals Temp: 98.2 F (36.8 C) BP: 117/73 Pulse Rate: 70 SpO2: 100 %   Anthropometric Measurements Height: 5\' 8"  (1.727 m) Weight: (!) 301 lb (136.5 kg) BMI (Calculated): 45.78 Weight at Last Visit: 304 lb Weight Lost Since Last Visit: 3 lb Weight Gained Since Last Visit: 0 Starting Weight: 330 lb Total Weight Loss (lbs): 29 lb (13.2 kg)   Body Composition  Body Fat %: 54.2 % Fat Mass (lbs): 163.6 lbs Muscle Mass (lbs): 131.2 lbs Total Body Water (lbs): 106.8 lbs Visceral Fat Rating : 20   Other Clinical Data Fasting: No Today's Visit #: 45 Starting Date: 07/16/18     ASSESSMENT AND PLAN:  Diet: Erin [name] is currently in the action stage of change. As such, her goal is to continue with weight loss efforts. She has agreed to  Category 2 Plan.  Exercise: Erin [name] has been instructed to try a geriatric exercise plan and to continue exercising as is for weight loss and overall health benefits.   Behavior Modification:  We discussed the following Behavioral Modification Strategies today: increasing lean protein intake, decreasing simple carbohydrates, increasing vegetables, increase H2O intake, increase high fiber foods, no skipping meals, meal planning and cooking strategies, avoiding temptations, and planning for success. We discussed various medication options to help Erin [name] with her weight loss efforts and we both agreed to continue current treatment plan, continue to work on nutritional and behavioral strategies to  promote weight loss.  .  Return in about 4 weeks (around 02/18/2024).Erin [name] She was informed of the importance of frequent follow up visits to maximize her success with intensive lifestyle modifications for her multiple health conditions.  Attestation Statements:   Reviewed by clinician on day of visit: allergies, medications, problem list, medical history, surgical history, family history, social history, and previous encounter notes.   Time spent on visit including pre-visit chart review and post-visit care and charting was 35 minutes.    Erin Lehrmann, PA-C

## 2024-02-06 ENCOUNTER — Other Ambulatory Visit (HOSPITAL_COMMUNITY): Payer: Self-pay

## 2024-02-18 ENCOUNTER — Other Ambulatory Visit (HOSPITAL_COMMUNITY): Payer: Self-pay

## 2024-02-18 MED ORDER — MOUNJARO 15 MG/0.5ML ~~LOC~~ SOAJ
15.0000 mg | SUBCUTANEOUS | 3 refills | Status: AC
  Filled 2024-02-18 – 2024-03-05 (×3): qty 2, 28d supply, fill #0
  Filled 2024-04-30: qty 2, 28d supply, fill #1
  Filled 2024-05-28: qty 2, 28d supply, fill #2
  Filled 2024-06-25: qty 2, 28d supply, fill #3

## 2024-03-01 NOTE — Progress Notes (Deleted)
   SUBJECTIVE: Discussed the use of AI scribe software for clinical note transcription with the patient, who gave verbal consent to proceed.  Chief Complaint: Obesity  Interim History: ***  Erin [name] is here to discuss her progress with her obesity treatment plan. She is on the {HWW Weight Loss Plan:210964005} and states she {CHL AMB IS/IS NOT:210130109} following her eating plan approximately *** % of the time. She states she {CHL AMB IS/IS NOT:210130109} exercising *** minutes *** times per week.   OBJECTIVE: Visit Diagnoses: Problem List Items Addressed This Visit     Hypertension associated with diabetes (HCC)   Diabetes mellitus (HCC) - Primary   Morbid obesity (HCC)   Vitamin D  deficiency   B12 deficiency   Other Visit Diagnoses       Type 2 diabetes mellitus with hyperlipidemia (HCC)         History of iron deficiency anemia           No data recorded No data recorded No data recorded No data recorded   ASSESSMENT AND PLAN:  Diet: Erin [name] {CHL AMB IS/IS NOT:210130109} currently in the action stage of change. As such, her goal is to {HWW Weight Loss Efforts:210964006}. She {HAS HAS WNU:81165} agreed to {HWW Weight Loss Plan:210964005}.  Exercise: Erin [name] has been instructed {HWW Exercise:210964007} for weight loss and overall health benefits.   Behavior Modification:  We discussed the following Behavioral Modification Strategies today: {HWW Behavior Modification:210964008}. We discussed various medication options to help Erin [name] with her weight loss efforts and we both agreed to ***.  No follow-ups on file.SABRA She was informed of the importance of frequent follow up visits to maximize her success with intensive lifestyle modifications for her multiple health conditions.  Attestation Statements:   Reviewed by clinician on day of visit: allergies, medications, problem list, medical history, surgical history, family history, social history, and previous encounter  notes.   Time spent on visit including pre-visit chart review and post-visit care and charting was *** minutes.    Charles Andringa, PA-C

## 2024-03-02 ENCOUNTER — Ambulatory Visit (INDEPENDENT_AMBULATORY_CARE_PROVIDER_SITE_OTHER): Admitting: Physician Assistant

## 2024-03-05 ENCOUNTER — Other Ambulatory Visit: Payer: Self-pay

## 2024-03-05 ENCOUNTER — Other Ambulatory Visit (HOSPITAL_COMMUNITY): Payer: Self-pay

## 2024-04-02 ENCOUNTER — Other Ambulatory Visit (HOSPITAL_COMMUNITY): Payer: Self-pay

## 2024-04-06 ENCOUNTER — Ambulatory Visit (INDEPENDENT_AMBULATORY_CARE_PROVIDER_SITE_OTHER): Admitting: Physician Assistant

## 2024-04-30 ENCOUNTER — Other Ambulatory Visit (HOSPITAL_COMMUNITY): Payer: Self-pay

## 2024-05-28 ENCOUNTER — Other Ambulatory Visit (HOSPITAL_COMMUNITY): Payer: Self-pay

## 2024-06-25 ENCOUNTER — Other Ambulatory Visit (HOSPITAL_COMMUNITY): Payer: Self-pay

## 2024-07-22 ENCOUNTER — Other Ambulatory Visit (HOSPITAL_COMMUNITY): Payer: Self-pay

## 2024-08-20 ENCOUNTER — Other Ambulatory Visit (HOSPITAL_COMMUNITY): Payer: Self-pay

## 2024-09-17 ENCOUNTER — Other Ambulatory Visit (HOSPITAL_COMMUNITY): Payer: Self-pay

## 2024-10-15 ENCOUNTER — Other Ambulatory Visit (INDEPENDENT_AMBULATORY_CARE_PROVIDER_SITE_OTHER): Payer: Self-pay | Admitting: Physician Assistant

## 2024-10-15 ENCOUNTER — Other Ambulatory Visit (HOSPITAL_COMMUNITY): Payer: Self-pay

## 2024-10-15 DIAGNOSIS — E1169 Type 2 diabetes mellitus with other specified complication: Secondary | ICD-10-CM
